# Patient Record
Sex: Female | Born: 1985 | Race: Black or African American | Hispanic: No | Marital: Married | Smoking: Never smoker
Health system: Southern US, Community
[De-identification: ages and names within clinical notes are randomized; demographics above are authoritative.]

## PROBLEM LIST (undated history)

## (undated) DIAGNOSIS — K219 Gastro-esophageal reflux disease without esophagitis: Secondary | ICD-10-CM

## (undated) DIAGNOSIS — F32A Depression, unspecified: Secondary | ICD-10-CM

## (undated) DIAGNOSIS — Z22322 Carrier or suspected carrier of Methicillin resistant Staphylococcus aureus: Secondary | ICD-10-CM

## (undated) DIAGNOSIS — F640 Transsexualism: Secondary | ICD-10-CM

## (undated) DIAGNOSIS — Z789 Other specified health status: Secondary | ICD-10-CM

## (undated) DIAGNOSIS — F329 Major depressive disorder, single episode, unspecified: Secondary | ICD-10-CM

## (undated) HISTORY — PX: FOOT SURGERY: SHX648

## (undated) HISTORY — DX: Depression, unspecified: F32.A

## (undated) HISTORY — DX: Major depressive disorder, single episode, unspecified: F32.9

## (undated) HISTORY — DX: Gastro-esophageal reflux disease without esophagitis: K21.9

---
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
## 2003-11-11 ENCOUNTER — Emergency Department (HOSPITAL_COMMUNITY): Admission: EM | Admit: 2003-11-11 | Discharge: 2003-11-11 | Payer: Self-pay | Admitting: Family Medicine

## 2004-04-16 ENCOUNTER — Emergency Department (HOSPITAL_COMMUNITY): Admission: EM | Admit: 2004-04-16 | Discharge: 2004-04-16 | Payer: Self-pay | Admitting: Family Medicine

## 2004-10-28 ENCOUNTER — Emergency Department (HOSPITAL_COMMUNITY): Admission: EM | Admit: 2004-10-28 | Discharge: 2004-10-28 | Payer: Self-pay | Admitting: Emergency Medicine

## 2007-07-07 ENCOUNTER — Emergency Department (HOSPITAL_COMMUNITY): Admission: EM | Admit: 2007-07-07 | Discharge: 2007-07-07 | Payer: Self-pay | Admitting: Family Medicine

## 2008-01-29 ENCOUNTER — Emergency Department (HOSPITAL_COMMUNITY): Admission: EM | Admit: 2008-01-29 | Discharge: 2008-01-29 | Payer: Self-pay | Admitting: Emergency Medicine

## 2008-09-27 ENCOUNTER — Emergency Department (HOSPITAL_COMMUNITY): Admission: EM | Admit: 2008-09-27 | Discharge: 2008-09-27 | Payer: Self-pay | Admitting: Emergency Medicine

## 2008-10-31 ENCOUNTER — Emergency Department (HOSPITAL_COMMUNITY): Admission: EM | Admit: 2008-10-31 | Discharge: 2008-10-31 | Payer: Self-pay | Admitting: Emergency Medicine

## 2009-05-10 ENCOUNTER — Emergency Department (HOSPITAL_COMMUNITY): Admission: EM | Admit: 2009-05-10 | Discharge: 2009-05-10 | Payer: Self-pay | Admitting: Family Medicine

## 2009-12-08 ENCOUNTER — Emergency Department (HOSPITAL_COMMUNITY): Admission: EM | Admit: 2009-12-08 | Discharge: 2009-12-08 | Payer: Self-pay | Admitting: Family Medicine

## 2010-01-03 ENCOUNTER — Emergency Department (HOSPITAL_COMMUNITY): Admission: EM | Admit: 2010-01-03 | Discharge: 2010-01-03 | Payer: Self-pay | Admitting: Family Medicine

## 2010-02-05 ENCOUNTER — Emergency Department (HOSPITAL_COMMUNITY): Admission: EM | Admit: 2010-02-05 | Discharge: 2010-02-05 | Payer: Self-pay | Admitting: Family Medicine

## 2010-06-02 LAB — POCT URINALYSIS DIP (DEVICE)
Glucose, UA: NEGATIVE mg/dL
Hgb urine dipstick: NEGATIVE
Protein, ur: NEGATIVE mg/dL
Specific Gravity, Urine: 1.02 (ref 1.005–1.030)
Urobilinogen, UA: 0.2 mg/dL (ref 0.0–1.0)

## 2010-06-15 LAB — CBC
HCT: 33.9 % — ABNORMAL LOW (ref 36.0–46.0)
Hemoglobin: 11.4 g/dL — ABNORMAL LOW (ref 12.0–15.0)
MCV: 86.1 fL (ref 78.0–100.0)
Platelets: 230 10*3/uL (ref 150–400)
WBC: 3.5 10*3/uL — ABNORMAL LOW (ref 4.0–10.5)

## 2010-06-15 LAB — DIFFERENTIAL
Eosinophils Absolute: 0 10*3/uL (ref 0.0–0.7)
Eosinophils Relative: 1 % (ref 0–5)
Lymphs Abs: 1.3 10*3/uL (ref 0.7–4.0)
Monocytes Absolute: 0.3 10*3/uL (ref 0.1–1.0)
Monocytes Relative: 10 % (ref 3–12)

## 2010-06-15 LAB — POCT URINALYSIS DIP (DEVICE)
Glucose, UA: NEGATIVE mg/dL
Hgb urine dipstick: NEGATIVE
Specific Gravity, Urine: 1.02 (ref 1.005–1.030)
Urobilinogen, UA: 4 mg/dL — ABNORMAL HIGH (ref 0.0–1.0)

## 2010-06-15 LAB — HEPATIC FUNCTION PANEL
ALT: 20 U/L (ref 0–35)
Alkaline Phosphatase: 53 U/L (ref 39–117)
Bilirubin, Direct: <0.1 mg/dL (ref 0.0–0.3)
Total Bilirubin: 0.5 mg/dL (ref 0.3–1.2)

## 2010-06-15 LAB — POCT I-STAT, CHEM 8
Calcium, Ion: 1.25 mmol/L (ref 1.12–1.32)
Hemoglobin: 12.9 g/dL (ref 12.0–15.0)
Sodium: 140 mEq/L (ref 135–145)
TCO2: 26 mmol/L (ref 0–100)

## 2010-06-15 LAB — HIV ANTIBODY (ROUTINE TESTING W REFLEX): HIV: NONREACTIVE

## 2010-06-24 ENCOUNTER — Emergency Department (HOSPITAL_COMMUNITY)
Admission: EM | Admit: 2010-06-24 | Discharge: 2010-06-25 | Disposition: A | Discharge status: Home / Self Care | Payer: 59 | Attending: Emergency Medicine | Admitting: Emergency Medicine

## 2010-06-24 ENCOUNTER — Emergency Department (HOSPITAL_COMMUNITY): Payer: 59

## 2010-06-24 DIAGNOSIS — K2970 Gastritis, unspecified, without bleeding: Secondary | ICD-10-CM | POA: Insufficient documentation

## 2010-06-24 DIAGNOSIS — R10816 Epigastric abdominal tenderness: Secondary | ICD-10-CM | POA: Insufficient documentation

## 2010-06-24 DIAGNOSIS — M549 Dorsalgia, unspecified: Secondary | ICD-10-CM | POA: Insufficient documentation

## 2010-06-24 DIAGNOSIS — R112 Nausea with vomiting, unspecified: Secondary | ICD-10-CM | POA: Insufficient documentation

## 2010-06-24 DIAGNOSIS — R1013 Epigastric pain: Secondary | ICD-10-CM | POA: Insufficient documentation

## 2010-06-24 LAB — DIFFERENTIAL
Basophils Absolute: 0 10*3/uL (ref 0.0–0.1)
Basophils Relative: 1 % (ref 0–1)
Eosinophils Relative: 1 % (ref 0–5)
Lymphocytes Relative: 33 % (ref 12–46)
Monocytes Absolute: 0.5 10*3/uL (ref 0.1–1.0)
Monocytes Relative: 11 % (ref 3–12)
Neutro Abs: 2.3 10*3/uL (ref 1.7–7.7)

## 2010-06-24 LAB — CBC
HCT: 36.8 % (ref 36.0–46.0)
Hemoglobin: 11.9 g/dL — ABNORMAL LOW (ref 12.0–15.0)
MCH: 27.5 pg (ref 26.0–34.0)
MCHC: 32.3 g/dL (ref 30.0–36.0)
RDW: 12.6 % (ref 11.5–15.5)

## 2010-06-24 LAB — URINALYSIS, ROUTINE W REFLEX MICROSCOPIC
Bilirubin Urine: NEGATIVE
Hgb urine dipstick: NEGATIVE
Ketones, ur: NEGATIVE mg/dL
Nitrite: NEGATIVE
Urobilinogen, UA: 0.2 mg/dL (ref 0.0–1.0)

## 2010-06-24 LAB — BASIC METABOLIC PANEL
CO2: 28 mEq/L (ref 19–32)
Calcium: 9.5 mg/dL (ref 8.4–10.5)
Creatinine, Ser: 0.73 mg/dL (ref 0.4–1.2)
GFR calc non Af Amer: >60 mL/min (ref >60)
Glucose, Bld: 95 mg/dL (ref 70–99)
Sodium: 139 mEq/L (ref 135–145)

## 2010-06-24 LAB — POCT PREGNANCY, URINE: Preg Test, Ur: NEGATIVE

## 2010-06-25 LAB — LIPASE, BLOOD: Lipase: 28 U/L (ref 11–59)

## 2010-06-25 LAB — POCT I-STAT, CHEM 8
HCT: 40 % (ref 36.0–46.0)
Hemoglobin: 13.6 g/dL (ref 12.0–15.0)
Potassium: 4.4 mEq/L (ref 3.5–5.1)
Sodium: 139 mEq/L (ref 135–145)
TCO2: 27 mmol/L (ref 0–100)

## 2010-06-25 LAB — CBC
MCH: 26.4 pg (ref 26.0–34.0)
MCHC: 30.9 g/dL (ref 30.0–36.0)
Platelets: 229 10*3/uL (ref 150–400)
RDW: 12.7 % (ref 11.5–15.5)

## 2010-06-25 LAB — HEPATIC FUNCTION PANEL
AST: 18 U/L (ref 0–37)
Albumin: 3.6 g/dL (ref 3.5–5.2)
Total Bilirubin: 0.3 mg/dL (ref 0.3–1.2)
Total Protein: 7.2 g/dL (ref 6.0–8.3)

## 2010-07-04 ENCOUNTER — Inpatient Hospital Stay (INDEPENDENT_AMBULATORY_CARE_PROVIDER_SITE_OTHER)
Admission: RE | Admit: 2010-07-04 | Discharge: 2010-07-04 | Disposition: A | Discharge status: Home / Self Care | Payer: 59 | Source: Ambulatory Visit | Attending: Emergency Medicine | Admitting: Emergency Medicine

## 2010-07-04 DIAGNOSIS — M545 Low back pain: Secondary | ICD-10-CM

## 2010-12-03 LAB — POCT RAPID STREP A: Streptococcus, Group A Screen (Direct): NEGATIVE

## 2011-09-17 ENCOUNTER — Encounter (HOSPITAL_COMMUNITY): Payer: Self-pay | Admitting: Emergency Medicine

## 2011-09-17 ENCOUNTER — Emergency Department (INDEPENDENT_AMBULATORY_CARE_PROVIDER_SITE_OTHER)
Admission: EM | Admit: 2011-09-17 | Discharge: 2011-09-17 | Disposition: A | Discharge status: Home / Self Care | Payer: Self-pay | Source: Home / Self Care | Attending: Emergency Medicine | Admitting: Emergency Medicine

## 2011-09-17 DIAGNOSIS — R590 Localized enlarged lymph nodes: Secondary | ICD-10-CM

## 2011-09-17 DIAGNOSIS — R599 Enlarged lymph nodes, unspecified: Secondary | ICD-10-CM

## 2011-09-17 MED ORDER — IBUPROFEN 800 MG PO TABS
ORAL_TABLET | ORAL | Status: AC
  Filled 2011-09-17: qty 1

## 2011-09-17 MED ORDER — HYDROCODONE-ACETAMINOPHEN 5-325 MG PO TABS
ORAL_TABLET | ORAL | Status: AC
  Filled 2011-09-17: qty 1

## 2011-09-17 MED ORDER — IBUPROFEN 800 MG PO TABS
800.0000 mg | ORAL_TABLET | Freq: Once | ORAL | Status: AC
  Administered 2011-09-17: 800 mg via ORAL

## 2011-09-17 MED ORDER — HYDROCODONE-ACETAMINOPHEN 5-325 MG PO TABS: 1.0000 | ORAL_TABLET | Freq: Once | ORAL | Status: DC

## 2011-09-17 MED ORDER — DOXYCYCLINE HYCLATE 100 MG PO CAPS: 100.0000 mg | ORAL_CAPSULE | Freq: Two times a day (BID) | ORAL | Status: DC

## 2011-09-17 MED ORDER — HYDROCODONE-ACETAMINOPHEN 5-500 MG PO TABS: 1.0000 | ORAL_TABLET | Freq: Four times a day (QID) | ORAL | Status: DC | PRN

## 2011-09-17 NOTE — ED Notes (Signed)
Notified dr Ladon Applebaum that patient is driving and reports there is no one else to drive.  Changed order to ibuprofen 800mg  po x 1 .

## 2011-09-17 NOTE — ED Provider Notes (Signed)
History     CSN: [id number]  Arrival date & time 09/17/11  1109   First MD Initiated Contact with Patient 09/17/11 1113      Chief Complaint  Patient presents with  . Neck Pain    (Consider location/radiation/quality/duration/timing/severity/associated sxs/prior treatment) HPI Comments: Shouldn't presents urgent care today describing increase pain and tenderness in the back of her neck where she feels a "is a big bump", she describes that after having her hair braided she started experiencing pain in this area and suddenly develop a sore which she started treating with topical antibiotic and alcohol. The sores seem to have improved and almost disappeared but then the swelling set in and is hurting her and has given her a headache and her right ear is hurting. Sunday and did feel a bit nauseous. Denies any constitutional symptoms such as fevers, arthralgias myalgias or changes in appetite.  Patient is a 26 y.o. female presenting with neck pain. The history is provided by the patient.  Neck Pain  This is a new problem. The current episode started more than 2 days ago. The problem occurs constantly. The problem has been gradually worsening. The pain is at a severity of 8/10. Associated symptoms include weakness. Pertinent negatives include no photophobia, no numbness and no tingling.    History reviewed. No pertinent past medical history.  History reviewed. No pertinent past surgical history.  No family history on file.  History  Substance Use Topics  . Smoking status: Never Smoker   . Smokeless tobacco: Not on file  . Alcohol Use: No    OB History    Grav Para Term Preterm Abortions TAB SAB Ect Mult Living                  Review of Systems  Constitutional: Positive for activity change. Negative for fever, chills, diaphoresis, appetite change and fatigue.  HENT: Positive for neck pain. Negative for congestion and neck stiffness.   Eyes: Negative for photophobia.  Skin:  Negative for rash and wound.  Neurological: Positive for weakness. Negative for tingling and numbness.    Allergies  Review of patient's allergies indicates no known allergies.  Home Medications   Current Outpatient Rx  Name Route Sig Dispense Refill  . ALEVE PO Oral Take by mouth.      BP 129/76  Pulse 72  Temp 98.5 F (36.9 C) (Oral)  Resp 16  SpO2 100%  LMP 07/09/2011  Physical Exam  Nursing note and vitals reviewed. Constitutional:  Non-toxic appearance. She does not have a sickly appearance. She does not appear ill. She appears distressed.  HENT:  Head: Normocephalic.  Eyes: Conjunctivae are normal. Right eye exhibits no discharge. Left eye exhibits no discharge.  Neck: Trachea normal and normal range of motion. Neck supple. No JVD present.    Cardiovascular: Regular rhythm.   Pulmonary/Chest: Effort normal and breath sounds normal.  Musculoskeletal: Normal range of motion. She exhibits tenderness.  Lymphadenopathy:    She has no cervical adenopathy.  Neurological: She is alert.  Skin: No rash noted.    ED Course  Procedures (including critical care time)  Labs Reviewed - No data to display No results found.   No diagnosis found.    MDM  Possibly reactive lymphadenopathy a suboccipital occipital. Will start patient on doxycycline and provide with Lortab for treatment of pain for 48 hours. Patient was instructed to return in 48 hours for recheck        Jimmie Molly, MD  09/17/11 1212 

## 2011-09-17 NOTE — ED Notes (Signed)
Reports Sunday was when pain started .  Reports pain in back of head, then neck , and now includes ears hurting.  Reports nausea, denies fever.  Denies vomiting.  Reports sore on back of scalp last week that has resolved per patient.

## 2011-09-19 ENCOUNTER — Emergency Department (INDEPENDENT_AMBULATORY_CARE_PROVIDER_SITE_OTHER)
Admission: EM | Admit: 2011-09-19 | Discharge: 2011-09-19 | Disposition: A | Discharge status: Home / Self Care | Payer: Self-pay | Source: Home / Self Care | Attending: Emergency Medicine | Admitting: Emergency Medicine

## 2011-09-19 ENCOUNTER — Encounter (HOSPITAL_COMMUNITY): Payer: Self-pay | Admitting: *Deleted

## 2011-09-19 DIAGNOSIS — L03818 Cellulitis of other sites: Secondary | ICD-10-CM

## 2011-09-19 DIAGNOSIS — IMO0002 Reserved for concepts with insufficient information to code with codable children: Secondary | ICD-10-CM

## 2011-09-19 DIAGNOSIS — L02818 Cutaneous abscess of other sites: Secondary | ICD-10-CM

## 2011-09-19 NOTE — ED Provider Notes (Signed)
History     CSN: [id number]  Arrival date & time 09/19/11  1131   First MD Initiated Contact with Patient 09/19/11 1216      Chief Complaint  Patient presents with  . Wound Check    (Consider location/radiation/quality/duration/timing/severity/associated sxs/prior treatment) HPI Comments: Patient returns today as instructed for followup of a posterior neck suspected soft tissue infection potentially early abscess formation. Patient proceeded to take antibiotics as prescribed along with Motrin for discomfort and pain and was applying heat compresses frequently. She reports that the area feels about the same the swelling is still there and tender and has not changed in size or have not observed any drainage. Patient denies any fevers, malaise changes in appetite or body aches or headaches." Feels the  infection is still there"  Patient is a 26 y.o. female presenting with wound check. The history is provided by the patient.  Wound Check  She was treated in the ED 5 to 10 days ago. Treatments since wound repair include oral antibiotics. There has been no drainage from the wound. The redness has not changed. The swelling has not changed. The pain has not changed.    History reviewed. No pertinent past medical history.  History reviewed. No pertinent past surgical history.  No family history on file.  History  Substance Use Topics  . Smoking status: Never Smoker   . Smokeless tobacco: Not on file  . Alcohol Use: No    OB History    Grav Para Term Preterm Abortions TAB SAB Ect Mult Living                  Review of Systems  Constitutional: Negative for activity change and appetite change.  Skin: Positive for wound. Negative for rash.  Neurological: Negative for light-headedness.    Allergies  Review of patient's allergies indicates no known allergies.  Home Medications   Current Outpatient Rx  Name Route Sig Dispense Refill  . DOXYCYCLINE HYCLATE 100 MG PO CAPS Oral  Take 1 capsule (100 mg total) by mouth 2 (two) times daily. 20 capsule 0  . HYDROCODONE-ACETAMINOPHEN 5-500 MG PO TABS Oral Take 1 tablet by mouth every 6 (six) hours as needed for pain. 30 tablet 0  . ALEVE PO Oral Take by mouth.      BP 142/75  Pulse 58  Temp 98.4 F (36.9 C) (Oral)  Resp 12  SpO2 100%  LMP 07/09/2011  Physical Exam  Nursing note and vitals reviewed. Constitutional: She appears well-developed and well-nourished.  Neck: Neck supple.  Lymphadenopathy:    She has no cervical adenopathy.  Skin: No rash noted. There is erythema.       ED Course  INCISION AND DRAINAGE Date/Time: 09/19/2011 1:12 PM Performed by: Caroll Weinheimer Authorized by: Jimmie Molly Consent: Verbal consent obtained. Consent given by: patient Patient understanding: patient states understanding of the procedure being performed Patient identity confirmed: verbally with patient Type: abscess Body area: head/neck Location details: scalp Anesthesia: local infiltration Local anesthetic: lidocaine 1% with epinephrine Anesthetic total: 8 ml Patient sedated: no Scalpel size: 11 Incision type: single straight Complexity: complex Drainage: purulent and serosanguinous Packing material: 1/4 in gauze and 1/4 in iodoform gauze Patient tolerance: Patient tolerated the procedure well with no immediate complications. Comments: Purulent exudate sample obtained for cultures   (including critical care time)  Labs Reviewed - No data to display No results found.   No diagnosis found.    MDM  Term visit 48 hours later  area felt fluctuant I&D performed with mild to moderate amount of pus obtained subsequent cavity was packed with iodoform packing. Patient instructed to return in 48 hours for packing removal in 1 week check. To continue with previously prescribed antibiotics.        Jimmie Molly, MD 09/19/11 1314

## 2011-09-19 NOTE — ED Notes (Signed)
Pt   Seen  Recently  For  abcess   Here  Today  For    A  Recheck       Pt  Reports  She  Took  Her  Anti  Biotics   But  Still  Has  Symptoms

## 2011-09-19 NOTE — ED Notes (Signed)
Pt  Reports  She  Has  irreg  Periods   And  She  Is  abstinant

## 2011-09-22 ENCOUNTER — Encounter (HOSPITAL_COMMUNITY): Payer: Self-pay | Admitting: *Deleted

## 2011-09-22 ENCOUNTER — Emergency Department (HOSPITAL_COMMUNITY)
Admission: EM | Admit: 2011-09-22 | Discharge: 2011-09-22 | Disposition: A | Discharge status: Home / Self Care | Payer: Self-pay | Source: Home / Self Care

## 2011-09-22 ENCOUNTER — Emergency Department (HOSPITAL_COMMUNITY)
Admission: EM | Admit: 2011-09-22 | Discharge: 2011-09-22 | Disposition: A | Discharge status: Home Health | Payer: Self-pay | Attending: Emergency Medicine | Admitting: Emergency Medicine

## 2011-09-22 ENCOUNTER — Telehealth (HOSPITAL_COMMUNITY): Payer: Self-pay | Admitting: *Deleted

## 2011-09-22 DIAGNOSIS — L0291 Cutaneous abscess, unspecified: Secondary | ICD-10-CM

## 2011-09-22 DIAGNOSIS — Z4801 Encounter for change or removal of surgical wound dressing: Secondary | ICD-10-CM | POA: Insufficient documentation

## 2011-09-22 LAB — CULTURE, ROUTINE-ABSCESS

## 2011-09-22 MED ORDER — IBUPROFEN 400 MG PO TABS
600.0000 mg | ORAL_TABLET | Freq: Once | ORAL | Status: AC
  Administered 2011-09-22: 600 mg via ORAL
  Filled 2011-09-22: qty 1

## 2011-09-22 MED ORDER — HYDROCODONE-ACETAMINOPHEN 5-325 MG PO TABS
1.0000 | ORAL_TABLET | Freq: Once | ORAL | Status: AC
  Administered 2011-09-22: 1 via ORAL
  Filled 2011-09-22: qty 1

## 2011-09-22 NOTE — ED Provider Notes (Signed)
History    This chart was scribed for Madeline Roots, MD, MD by Smitty Pluck. The patient was seen in room TR08C and the patient's care was started at 11:44PM.   CSN: [id number]  Arrival date & time 09/22/11  1123   First MD Initiated Contact with Patient 09/22/11 1137      Chief Complaint  Patient presents with  . Wound Check    (Consider location/radiation/quality/duration/timing/severity/associated sxs/prior treatment) The history is provided by the patient.   Madeline Larsen is a 26 y.o. female who presents to the Emergency Department due to wound check on posterior neck. The wound was drained 3 days ago. Reports moderate pain in area of wound. Pt had wound for 1 week before being seen in ED for packing. Pain has been constant since onset. Denies radiation. Pt reports having used abx without relief. Constant dull pain worse w palp. No fever or chills. No spreading redness. No headache.    History reviewed. No pertinent past medical history.  History reviewed. No pertinent past surgical history.  No family history on file.  History  Substance Use Topics  . Smoking status: Never Smoker   . Smokeless tobacco: Not on file  . Alcohol Use: No    OB History    Grav Para Term Preterm Abortions TAB SAB Ect Mult Living                  Review of Systems  Constitutional: Negative for fever and chills.  Respiratory: Negative for shortness of breath.   Gastrointestinal: Negative for nausea and vomiting.  Neurological: Negative for weakness.    Allergies  Review of patient's allergies indicates no known allergies.  Home Medications   Current Outpatient Rx  Name Route Sig Dispense Refill  . DOXYCYCLINE HYCLATE 100 MG PO CAPS Oral Take 1 capsule (100 mg total) by mouth 2 (two) times daily. 20 capsule 0  . HYDROCODONE-ACETAMINOPHEN 5-500 MG PO TABS Oral Take 1 tablet by mouth every 6 (six) hours as needed for pain. 30 tablet 0  . ALEVE PO Oral Take by mouth.      BP  107/72  Temp 97.8 F (36.6 C) (Oral)  Resp 18  SpO2 100%  LMP 09/20/2011  Physical Exam  Nursing note and vitals reviewed. Constitutional: She is oriented to person, place, and time. She appears well-developed and well-nourished. No distress.  HENT:  Head: Normocephalic and atraumatic.  Eyes: EOM are normal.  Neck: Neck supple. No tracheal deviation present.  Cardiovascular: Normal rate.   Pulmonary/Chest: Effort normal. No respiratory distress.  Musculoskeletal: Normal range of motion.  Neurological: She is alert and oriented to person, place, and time.  Skin: Skin is warm and dry.       Small drained abscess base of hairline/posterior scalp. 1 cm open I and D incision w packing in place. Pus draining from wound. No cellulitis. No necrotic tissue.   Psychiatric: She has a normal mood and affect. Her behavior is normal.    ED Course  Procedures (including critical care time) DIAGNOSTIC STUDIES: Oxygen Saturation is 100% on room air, normal by my interpretation.    COORDINATION OF CARE:      MDM  I personally performed the services described in this documentation, which was scribed in my presence. The recorded information has been reviewed and considered. Madeline Roots, MD    Old packing removed. Moderate pus able to be expressed from wound.   Local anesthesia w 2%lido w epi. 4  cc's used. Broke up loculations. Additional pus drained. New packing placed.  No cellulitis.   Pt has ride, does not have to drive. Requests pain med. vicodin 1po. Motrin po.  Sterile dressing.         Madeline Roots, MD 09/22/11 1159

## 2011-09-22 NOTE — ED Notes (Signed)
Abscess culture: MRSA.  Pt. adequately treated with Doxycycline.  I called home/cell # and the person that answered does not know her. I called work number and she does not work there any longer.  It looked like pt. was here now for a recheck. I asked registration and pt. left without being seen because she did not want make a payment.     Pt. went to the ED. I called there and verified pt. x 2 . Pt. given results and told she was adequately treated with Doxycycline.   Pt. given MRSA instructions. Pt. states she has another sore on her foot.  I told her to talk to her nurse and doctor about it to make sure she has enough antibiotic to cover it.

## 2011-09-22 NOTE — ED Notes (Signed)
Patient with wound drained x 3 days ago, patient now with packing to be removed

## 2011-09-26 ENCOUNTER — Encounter (HOSPITAL_COMMUNITY): Payer: Self-pay

## 2011-09-26 ENCOUNTER — Emergency Department (HOSPITAL_COMMUNITY)
Admission: EM | Admit: 2011-09-26 | Discharge: 2011-09-26 | Disposition: A | Discharge status: Home / Self Care | Payer: Self-pay | Attending: Emergency Medicine | Admitting: Emergency Medicine

## 2011-09-26 DIAGNOSIS — Z8614 Personal history of Methicillin resistant Staphylococcus aureus infection: Secondary | ICD-10-CM | POA: Insufficient documentation

## 2011-09-26 DIAGNOSIS — B9789 Other viral agents as the cause of diseases classified elsewhere: Secondary | ICD-10-CM | POA: Insufficient documentation

## 2011-09-26 DIAGNOSIS — Z4800 Encounter for change or removal of nonsurgical wound dressing: Secondary | ICD-10-CM

## 2011-09-26 DIAGNOSIS — B349 Viral infection, unspecified: Secondary | ICD-10-CM

## 2011-09-26 DIAGNOSIS — M791 Myalgia, unspecified site: Secondary | ICD-10-CM

## 2011-09-26 DIAGNOSIS — IMO0001 Reserved for inherently not codable concepts without codable children: Secondary | ICD-10-CM | POA: Insufficient documentation

## 2011-09-26 DIAGNOSIS — R110 Nausea: Secondary | ICD-10-CM | POA: Insufficient documentation

## 2011-09-26 HISTORY — DX: Carrier or suspected carrier of methicillin resistant Staphylococcus aureus: Z22.322

## 2011-09-26 LAB — URINALYSIS, ROUTINE W REFLEX MICROSCOPIC
Ketones, ur: 15 mg/dL — AB
Leukocytes, UA: NEGATIVE
Nitrite: NEGATIVE
Specific Gravity, Urine: 1.025 (ref 1.005–1.030)
pH: 6 (ref 5.0–8.0)

## 2011-09-26 LAB — URINE MICROSCOPIC-ADD ON

## 2011-09-26 LAB — POCT PREGNANCY, URINE: Preg Test, Ur: NEGATIVE

## 2011-09-26 MED ORDER — PROMETHAZINE HCL 25 MG PO TABS: ORAL_TABLET | ORAL | Status: DC

## 2011-09-26 MED ORDER — ONDANSETRON 4 MG PO TBDP
8.0000 mg | ORAL_TABLET | Freq: Once | ORAL | Status: AC
  Administered 2011-09-26: 8 mg via ORAL
  Filled 2011-09-26: qty 2

## 2011-09-26 NOTE — ED Provider Notes (Signed)
History  This chart was scribed for Ward Givens, MD by Erskine Emery. This patient was seen in room TR08C/TR08C and the patient's care was started at 10:58.   CSN: [id number]  Arrival date & time 09/26/11  0865   First MD Initiated Contact with Patient 09/26/11 1058      Chief Complaint  Patient presents with  . Abscess  . Fever    (Consider location/radiation/quality/duration/timing/severity/associated sxs/prior treatment) HPI  Madeline Larsen is a 26 y.o. female who presents to the Emergency Department complaining of constant nausea with associated fever, body aches, chills, diaphoresis, a little diarrhea, and intermittant upper abdominal pain, described as sometimes sharp off and on  for the last 2 days. Pt denies any emesis, coughing, or sore throat. She denies dysuria, frequency or vaginal discharge.    Pt reports she was seen last week in urgent care for the abscess on the back of her neck and prescribed antibiotics. Pt reports the pain associated with the abscess is still present but improving.    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   PCP none  Past Medical History  Diagnosis Date  . MRSA (methicillin resistant staph aureus) culture positive     No past surgical history on file.  No family history on file.  History  Substance Use Topics  . Smoking status: Never Smoker   . Smokeless tobacco: Not on file  . Alcohol Use: No  Employed  OB History    Grav Para Term Preterm Abortions TAB SAB Ect Mult Living                   Review of Systems  Constitutional: Positive for fever, chills and diaphoresis.  HENT: Negative for sore throat.   Eyes: Positive for pain.  Respiratory: Negative for cough and shortness of breath.   Gastrointestinal: Positive for nausea, abdominal pain and diarrhea. Negative for vomiting.  Genitourinary: Negative for dysuria and vaginal discharge.    Skin:       Abscess on posterior neck  Neurological: Negative for weakness.    Allergies  Review of patient's allergies indicates no known allergies.  Home Medications   Current Outpatient Rx  Name Route Sig Dispense Refill  . DOXYCYCLINE HYCLATE 100 MG PO TABS Oral Take 100 mg by mouth 2 (two) times daily. Started on 7/10 for 10 days    . IBUPROFEN 800 MG PO TABS Oral Take 800 mg by mouth every 8 (eight) hours as needed. For pain    . ALEVE PO Oral Take 1 tablet by mouth daily as needed. For pain      BP 133/75  Pulse 91  Temp 98.5 F (36.9 C) (Oral)  Resp 18  SpO2 99%  LMP 09/20/2011\  Vital signs normal    Physical Exam  Nursing note and vitals reviewed. Constitutional: She is oriented to person, place, and time. She appears well-developed and well-nourished. No distress.  HENT:  Head: Normocephalic and atraumatic.  Right Ear: External ear normal.  Left Ear: External ear normal.  Mouth/Throat: Oropharynx is clear and moist.  Eyes: Conjunctivae and EOM are normal. Pupils are equal, round, and reactive to light.  Neck: Normal range of motion. Neck supple. No tracheal deviation present.  Cardiovascular: Normal rate, regular rhythm and normal heart sounds.   Pulmonary/Chest: Effort normal and breath sounds normal. No respiratory distress.  Abdominal: Soft. Bowel  sounds are normal. She exhibits no distension. There is tenderness. There is no rebound and no guarding.       Mild tenderness in suprapubic region.   Musculoskeletal: Normal range of motion.  Neurological: She is alert and oriented to person, place, and time.  Skin: Skin is warm and dry.       Area at lower scalp line of posterior neck with crusting dried blood surrounding it and a small amount of drainage, but no induration underneath. I pulled the packing out.   Psychiatric: She has a normal mood and affect. Her behavior is normal.    ED Course  Procedures (including critical care time)   Medications   ondansetron (ZOFRAN-ODT) disintegrating tablet 8 mg (8 mg Oral Given 09/26/11 1146)     DIAGNOSTIC STUDIES: Oxygen Saturation is 99% on room air, normal by my interpretation.    COORDINATION OF CARE: 11:18--I discussed treatment plan including nausea medication and urinalysis with pt and pt agreed. I informed her that most likely her symptoms are due to a GI infection and tended to her abscess.   11:30--Medication order: Ondansetron (Zofran-ODT) disintegrating tablet 8 mg--once  12:26--I rechecked the pt and her nausea is better, and notified her that she can begin oral hydrating. I told her I would check in again after her urinalysis is back.    13:51--I rechecked the pt. She has been drinking oral fluids without vomiting. She still reports chills but I let her know that her temperature is currently normal. I informed her that I would prescribe her some nausea medication and motrin or tylenol. I directed the pt not to eat so she does not vomit and let her know that we would be discharging her soon. Pt requests work note for the weekend.   Results for orders placed during the hospital encounter of 09/26/11  URINALYSIS, ROUTINE W REFLEX MICROSCOPIC      Component Value Range   Color, Urine YELLOW  YELLOW   APPearance CLOUDY (*) CLEAR   Specific Gravity, Urine 1.025  1.005 - 1.030   pH 6.0  5.0 - 8.0   Glucose, UA NEGATIVE  NEGATIVE mg/dL   Hgb urine dipstick TRACE (*) NEGATIVE   Bilirubin Urine SMALL (*) NEGATIVE   Ketones, ur 15 (*) NEGATIVE mg/dL   Protein, ur 30 (*) NEGATIVE mg/dL   Urobilinogen, UA 1.0  0.0 - 1.0 mg/dL   Nitrite NEGATIVE  NEGATIVE   Leukocytes, UA NEGATIVE  NEGATIVE  POCT PREGNANCY, URINE      Component Value Range   Preg Test, Ur NEGATIVE  NEGATIVE  URINE MICROSCOPIC-ADD ON      Component Value Range   Squamous Epithelial / LPF MANY (*) RARE   WBC, UA 0-2  <3 WBC/hpf   RBC / HPF 0-2  <3 RBC/hpf   Bacteria, UA RARE  RARE   Casts HYALINE CASTS (*) NEGATIVE    Urine-Other MUCOUS PRESENT     Laboratory interpretation all normal      1. Nausea   2. Myalgia   3. Viral illness     New Prescriptions   PROMETHAZINE (PHENERGAN) 25 MG TABLET    Unwrap and insert 1 PR PRN nausea, vomiting     Plan discharge  Devoria Albe, MD, FACEP   MDM   I personally performed the services described in this documentation, which was scribed in my presence. The recorded information has been reviewed and considered.  Devoria Albe, MD, Armando Gang  Ward Givens, MD 09/26/11 1358

## 2011-09-26 NOTE — ED Notes (Signed)
Patient is tolerating po fluids.

## 2011-09-26 NOTE — ED Notes (Signed)
Abscess follow up to back of neck and dx with MRSA, compliains of flu like symptoms, with fever and generlalized body aches.

## 2011-12-18 ENCOUNTER — Emergency Department (HOSPITAL_COMMUNITY)
Admission: EM | Admit: 2011-12-18 | Discharge: 2011-12-18 | Disposition: A | Discharge status: Home / Self Care | Payer: Self-pay | Attending: Emergency Medicine | Admitting: Emergency Medicine

## 2011-12-18 ENCOUNTER — Encounter (HOSPITAL_COMMUNITY): Payer: Self-pay | Admitting: *Deleted

## 2011-12-18 DIAGNOSIS — M62838 Other muscle spasm: Secondary | ICD-10-CM | POA: Insufficient documentation

## 2011-12-18 MED ORDER — CYCLOBENZAPRINE HCL 10 MG PO TABS
5.0000 mg | ORAL_TABLET | Freq: Once | ORAL | Status: AC
  Administered 2011-12-18: 5 mg via ORAL
  Filled 2011-12-18: qty 1

## 2011-12-18 MED ORDER — CYCLOBENZAPRINE HCL 10 MG PO TABS: 10.0000 mg | ORAL_TABLET | Freq: Two times a day (BID) | ORAL | Status: DC | PRN

## 2011-12-18 NOTE — ED Provider Notes (Signed)
History     CSN: [id number]  Arrival date & time 12/18/11  0804   First MD Initiated Contact with Patient 12/18/11 [phone]      No chief complaint on file.   (Consider location/radiation/quality/duration/timing/severity/associated sxs/prior treatment) Patient is a 26 y.o. female presenting with shoulder pain. The history is provided by the patient.  Shoulder Pain This is a new problem. The current episode started in the past 7 days. The problem occurs constantly. The problem has been unchanged. Pertinent negatives include no abdominal pain, change in bowel habit, chest pain, coughing, fatigue, fever, headaches, joint swelling, myalgias or nausea. Exacerbated by: palpation to shoulder, movement of left shoulder. She has tried nothing for the symptoms. The treatment provided no relief.    Past Medical History  Diagnosis Date  . MRSA (methicillin resistant staph aureus) culture positive     No past surgical history on file.  No family history on file.  History  Substance Use Topics  . Smoking status: Never Smoker   . Smokeless tobacco: Not on file  . Alcohol Use: No    OB History    Grav Para Term Preterm Abortions TAB SAB Ect Mult Living                  Review of Systems  Constitutional: Negative for fever and fatigue.  Respiratory: Negative for cough and shortness of breath.   Cardiovascular: Negative for chest pain.  Gastrointestinal: Negative for nausea, abdominal pain, diarrhea and change in bowel habit.  Musculoskeletal: Negative for myalgias and joint swelling.  Neurological: Negative for headaches.  All other systems reviewed and are negative.    Allergies  Review of patient's allergies indicates no known allergies.  Home Medications   Current Outpatient Rx  Name Route Sig Dispense Refill  . DOXYCYCLINE HYCLATE 100 MG PO TABS Oral Take 100 mg by mouth 2 (two) times daily. Started on 7/10 for 10 days    . IBUPROFEN 800 MG PO TABS Oral Take 800 mg by mouth  every 8 (eight) hours as needed. For pain    . ALEVE PO Oral Take 1 tablet by mouth daily as needed. For pain    . PROMETHAZINE HCL 25 MG PO TABS  Unwrap and insert 1 PR PRN nausea, vomiting 8 tablet 0    There were no vitals taken for this visit.  Physical Exam  Nursing note and vitals reviewed. Constitutional: She is oriented to person, place, and time. She appears well-developed and well-nourished. No distress.  HENT:  Head: Normocephalic and atraumatic.  Eyes: EOM are normal. Pupils are equal, round, and reactive to light.  Neck: Normal range of motion.  Cardiovascular: Normal rate and normal heart sounds.   Pulmonary/Chest: Effort normal and breath sounds normal. No respiratory distress.  Abdominal: Soft. She exhibits no distension. There is no tenderness.  Musculoskeletal: Normal range of motion. She exhibits tenderness.       Left shoulder: She exhibits tenderness, pain and spasm. She exhibits normal range of motion, no bony tenderness, no swelling, no effusion, no crepitus, no deformity, no laceration, normal pulse and normal strength.  Neurological: She is alert and oriented to person, place, and time.  Skin: Skin is warm and dry.    ED Course  Procedures (including critical care time)  Labs Reviewed - No data to display No results found.   1. Muscle spasm of left shoulder       MDM  8:11 AM Pt seen and examined. Pt with about  a week of left shoulder discomfort. Pt denies trauma or excessive lifting. Pt denies medical problems. On exam, patient with left trapezius spasm. Will treat with muscle relaxants.      Daleen Bo, MD 12/18/11 [phone]

## 2011-12-18 NOTE — ED Provider Notes (Signed)
I saw and evaluated the patient, reviewed the resident's note and I agree with the findings and plan.  Loren Racer, MD 12/18/11 1531

## 2012-07-27 ENCOUNTER — Emergency Department (HOSPITAL_COMMUNITY)
Admission: EM | Admit: 2012-07-27 | Discharge: 2012-07-27 | Disposition: A | Discharge status: Home / Self Care | Payer: Self-pay | Attending: Emergency Medicine | Admitting: Emergency Medicine

## 2012-07-27 ENCOUNTER — Encounter (HOSPITAL_COMMUNITY): Payer: Self-pay | Admitting: Emergency Medicine

## 2012-07-27 DIAGNOSIS — Z8614 Personal history of Methicillin resistant Staphylococcus aureus infection: Secondary | ICD-10-CM | POA: Insufficient documentation

## 2012-07-27 DIAGNOSIS — K5289 Other specified noninfective gastroenteritis and colitis: Secondary | ICD-10-CM | POA: Insufficient documentation

## 2012-07-27 DIAGNOSIS — R112 Nausea with vomiting, unspecified: Secondary | ICD-10-CM | POA: Insufficient documentation

## 2012-07-27 DIAGNOSIS — R197 Diarrhea, unspecified: Secondary | ICD-10-CM | POA: Insufficient documentation

## 2012-07-27 DIAGNOSIS — R109 Unspecified abdominal pain: Secondary | ICD-10-CM | POA: Insufficient documentation

## 2012-07-27 DIAGNOSIS — K529 Noninfective gastroenteritis and colitis, unspecified: Secondary | ICD-10-CM

## 2012-07-27 MED ORDER — MORPHINE SULFATE 4 MG/ML IJ SOLN
4.0000 mg | Freq: Once | INTRAMUSCULAR | Status: AC
  Administered 2012-07-27: 4 mg via INTRAVENOUS
  Filled 2012-07-27: qty 1

## 2012-07-27 MED ORDER — ONDANSETRON HCL 4 MG/2ML IJ SOLN
4.0000 mg | Freq: Once | INTRAMUSCULAR | Status: AC
  Administered 2012-07-27: 4 mg via INTRAVENOUS
  Filled 2012-07-27: qty 2

## 2012-07-27 MED ORDER — ONDANSETRON HCL 8 MG PO TABS: 8.0000 mg | ORAL_TABLET | Freq: Three times a day (TID) | ORAL | Status: DC | PRN

## 2012-07-27 MED ORDER — SODIUM CHLORIDE 0.9 % IV BOLUS (SEPSIS)
1000.0000 mL | Freq: Once | INTRAVENOUS | Status: AC
  Administered 2012-07-27: 1000 mL via INTRAVENOUS

## 2012-07-27 NOTE — ED Notes (Signed)
Pt. Stated, it started 3 days ago with chills and fever and my body hurts all over.  I've been having nausea vomiting for 3 days.

## 2012-07-27 NOTE — ED Provider Notes (Signed)
History     CSN: [id number]  Arrival date & time 07/27/12  0809   First MD Initiated Contact with Patient 07/27/12 0815      Chief Complaint  Patient presents with  . Diarrhea    (Consider location/radiation/quality/duration/timing/severity/associated sxs/prior treatment) Patient is a 27 y.o. female presenting with diarrhea. The history is provided by the patient.  Diarrhea Associated symptoms: vomiting   Associated symptoms: no chills, no fever and no headaches   pt c/o nvd onset yesterday. Several episodes of both emesis and diarrhea. Watery, clear. No bloody or bilious emesis. No bloody stools. Intermittent mid abd crampy discomfort, no constant or focal abd pain. No known ill contacts or bad food ingestion. No recent new meds, abx tx, or change in meds. Denies fever or chills. Denies prior abd surgery. No distension. Normal period 2 weeks ago, no vaginal discharge or bleeding. No dysuria or gu c/o. No back or flank pain.     Past Medical History  Diagnosis Date  . MRSA (methicillin resistant staph aureus) culture positive     History reviewed. No pertinent past surgical history.  No family history on file.  History  Substance Use Topics  . Smoking status: Never Smoker   . Smokeless tobacco: Not on file  . Alcohol Use: No    OB History   Grav Para Term Preterm Abortions TAB SAB Ect Mult Living                  Review of Systems  Constitutional: Negative for fever and chills.  HENT: Negative for sore throat and neck pain.   Eyes: Negative for redness.  Respiratory: Negative for cough and shortness of breath.   Cardiovascular: Negative for chest pain.  Gastrointestinal: Positive for vomiting and diarrhea.  Genitourinary: Negative for dysuria and flank pain.  Musculoskeletal: Negative for back pain.  Skin: Negative for rash.  Neurological: Negative for headaches.  Hematological: Does not bruise/bleed easily.  Psychiatric/Behavioral: Negative for confusion.     Allergies  Review of patient's allergies indicates no known allergies.  Home Medications   Current Outpatient Rx  Name  Route  Sig  Dispense  Refill  . cyclobenzaprine (FLEXERIL) 10 MG tablet   Oral   Take 1 tablet (10 mg total) by mouth 2 (two) times daily as needed for muscle spasms.   10 tablet   0   . ibuprofen (ADVIL,MOTRIN) 200 MG tablet   Oral   Take 200 mg by mouth every 6 (six) hours as needed. For pain           BP 132/82  Pulse 81  Temp(Src) 99.2 F (37.3 C) (Oral)  Resp 20  SpO2 96%  LMP 07/06/2012  Physical Exam  Nursing note and vitals reviewed. Constitutional: She appears well-developed and well-nourished. No distress.  HENT:  Mouth/Throat: Oropharynx is clear and moist.  Eyes: Conjunctivae are normal. No scleral icterus.  Neck: Neck supple. No tracheal deviation present.  Cardiovascular: Normal rate, regular rhythm, normal heart sounds and intact distal pulses.   Pulmonary/Chest: Effort normal and breath sounds normal. No respiratory distress.  Abdominal: Soft. Normal appearance and bowel sounds are normal. She exhibits no distension and no mass. There is no tenderness. There is no rebound and no guarding.  Genitourinary:  No cva tenderness  Musculoskeletal: She exhibits no edema.  Neurological: She is alert.  Skin: Skin is warm and dry. No rash noted.  Psychiatric: She has a normal mood and affect.    ED Course  Procedures (including critical care time)     MDM  Iv ns bolus. zofran iv.  Reviewed nursing notes and prior charts for additional history.   Pt requesting food/drink.  Crackers, po fluids, tolerates.    No recurrent vomiting or diarrhea in ed.  abd soft nt.  Pt appears stable for d/c.         Suzi Roots, MD 07/27/12 1016

## 2012-07-27 NOTE — ED Notes (Signed)
Report toMarisela, RN

## 2012-07-27 NOTE — ED Notes (Signed)
Patient is alert and orientedx4.  Patient was explained discharge instructions and they understood them with no questions.  The patient's girlfriend, Wyonia Hough is taking her home.

## 2012-07-27 NOTE — ED Notes (Signed)
Patient requested something for pain all over.

## 2012-07-27 NOTE — ED Notes (Signed)
Family at bedside. 

## 2012-07-27 NOTE — ED Notes (Signed)
Introduced myself to patient.  She is resting comfortably.

## 2012-10-06 ENCOUNTER — Emergency Department (HOSPITAL_COMMUNITY)
Admission: EM | Admit: 2012-10-06 | Discharge: 2012-10-06 | Disposition: A | Discharge status: Home / Self Care | Payer: Self-pay | Attending: Emergency Medicine | Admitting: Emergency Medicine

## 2012-10-06 ENCOUNTER — Encounter (HOSPITAL_COMMUNITY): Payer: Self-pay | Admitting: Emergency Medicine

## 2012-10-06 DIAGNOSIS — R5381 Other malaise: Secondary | ICD-10-CM | POA: Insufficient documentation

## 2012-10-06 DIAGNOSIS — R531 Weakness: Secondary | ICD-10-CM

## 2012-10-06 DIAGNOSIS — R5383 Other fatigue: Secondary | ICD-10-CM | POA: Insufficient documentation

## 2012-10-06 DIAGNOSIS — Z22322 Carrier or suspected carrier of Methicillin resistant Staphylococcus aureus: Secondary | ICD-10-CM | POA: Insufficient documentation

## 2012-10-06 DIAGNOSIS — R21 Rash and other nonspecific skin eruption: Secondary | ICD-10-CM | POA: Insufficient documentation

## 2012-10-06 DIAGNOSIS — R51 Headache: Secondary | ICD-10-CM | POA: Insufficient documentation

## 2012-10-06 DIAGNOSIS — R55 Syncope and collapse: Secondary | ICD-10-CM | POA: Insufficient documentation

## 2012-10-06 DIAGNOSIS — Z3202 Encounter for pregnancy test, result negative: Secondary | ICD-10-CM | POA: Insufficient documentation

## 2012-10-06 LAB — CBC WITH DIFFERENTIAL/PLATELET
Eosinophils Absolute: 0 10*3/uL (ref 0.0–0.7)
Eosinophils Relative: 1 % (ref 0–5)
HCT: 36.4 % (ref 36.0–46.0)
Lymphocytes Relative: 39 % (ref 12–46)
Lymphs Abs: 1.1 10*3/uL (ref 0.7–4.0)
MCH: 27.3 pg (ref 26.0–34.0)
MCV: 84.3 fL (ref 78.0–100.0)
Monocytes Absolute: 0.2 10*3/uL (ref 0.1–1.0)
Monocytes Relative: 6 % (ref 3–12)
Platelets: 286 10*3/uL (ref 150–400)
RBC: 4.32 MIL/uL (ref 3.87–5.11)
WBC: 2.9 10*3/uL — ABNORMAL LOW (ref 4.0–10.5)

## 2012-10-06 LAB — POCT I-STAT, CHEM 8
Chloride: 106 mEq/L (ref 96–112)
Glucose, Bld: 85 mg/dL (ref 70–99)
HCT: 38 % (ref 36.0–46.0)
Potassium: 3.9 mEq/L (ref 3.5–5.1)

## 2012-10-06 LAB — URINALYSIS, ROUTINE W REFLEX MICROSCOPIC
Bilirubin Urine: NEGATIVE
Glucose, UA: NEGATIVE mg/dL
Hgb urine dipstick: NEGATIVE
Ketones, ur: NEGATIVE mg/dL
Leukocytes, UA: NEGATIVE
Protein, ur: NEGATIVE mg/dL
pH: 6 (ref 5.0–8.0)

## 2012-10-06 LAB — BASIC METABOLIC PANEL
CO2: 26 mEq/L (ref 19–32)
Chloride: 102 mEq/L (ref 96–112)
Creatinine, Ser: 0.72 mg/dL (ref 0.50–1.10)
Glucose, Bld: 85 mg/dL (ref 70–99)

## 2012-10-06 LAB — GLUCOSE, CAPILLARY: Glucose-Capillary: 87 mg/dL (ref 70–99)

## 2012-10-06 MED ORDER — SODIUM CHLORIDE 0.9 % IV BOLUS (SEPSIS)
1000.0000 mL | Freq: Once | INTRAVENOUS | Status: AC
  Administered 2012-10-06: 1000 mL via INTRAVENOUS

## 2012-10-06 MED ORDER — IBUPROFEN 800 MG PO TABS: 800.0000 mg | ORAL_TABLET | Freq: Three times a day (TID) | ORAL | Status: DC

## 2012-10-06 MED ORDER — IBUPROFEN 800 MG PO TABS
800.0000 mg | ORAL_TABLET | Freq: Once | ORAL | Status: AC
  Administered 2012-10-06: 800 mg via ORAL
  Filled 2012-10-06: qty 1

## 2012-10-06 MED ORDER — DOXYCYCLINE HYCLATE 100 MG PO CAPS: 100.0000 mg | ORAL_CAPSULE | Freq: Two times a day (BID) | ORAL | Status: DC

## 2012-10-06 MED ORDER — ONDANSETRON HCL 4 MG PO TABS: 4.0000 mg | ORAL_TABLET | Freq: Four times a day (QID) | ORAL | Status: DC

## 2012-10-06 MED ORDER — ONDANSETRON HCL 4 MG/2ML IJ SOLN
4.0000 mg | Freq: Once | INTRAMUSCULAR | Status: AC
  Administered 2012-10-06: 4 mg via INTRAVENOUS
  Filled 2012-10-06: qty 2

## 2012-10-06 MED ORDER — DEXTROSE 5 % AND 0.45 % NACL IV BOLUS: 1000.0000 mL | Freq: Once | INTRAVENOUS | Status: DC

## 2012-10-06 NOTE — ED Notes (Addendum)
Pt reports she woke up fine but started feeling faint and nauseas as the day progresses. Reports this has happened before almost once a month. Pt reports also bumps/ warts on her, 1 on arm and 1 on leg. Pt reports someone told her she might need to be checked for diabetes.  Also headache 6/10.

## 2012-10-06 NOTE — ED Provider Notes (Signed)
CSN: [id number]     Arrival date & time 10/06/12  1229 History     First MD Initiated Contact with Patient 10/06/12 1333     Chief Complaint  Patient presents with  . feels faint and nauseas    . Headache   (Consider location/radiation/quality/duration/timing/severity/associated sxs/prior Treatment) HPI  27 year old female presents complaining of generalized weakness. The patient states for the past 2 months she has had intermittent bouts of feeling weak, tired, lethargic, and nearly passing out. She also endorsed nausea with occasional nonbloody nonbilious vomitus, last vomit was yesterday. She works at [employer] and denies any strenuous activities however she feels very tired. She also notice an itchy burning rash which appears in random spots in the body ongoing for the past 2 months as well. Rash sometimes drain out pustular discharge. Once the rash resolved but usually leaves a dark patch on her skin. Today patient also endorsed having a headache. Described as a throbbing sensation to L4 head, nonradiating with no neck stiffness. Patient denies fever, chills or chest pain, shortness of breath, throat swelling, abdominal pain, back pain, dysuria, hematuria, hematochezia or melena, numbness or tingling sensation. Patient's last menstrual period was last week. She is a G0P0. Patient is a nonsmoker, no history of diabetes.  No recent tick bite.    Past Medical History  Diagnosis Date  . MRSA (methicillin resistant staph aureus) culture positive    History reviewed. No pertinent past surgical history. No family history on file. History  Substance Use Topics  . Smoking status: Never Smoker   . Smokeless tobacco: Not on file  . Alcohol Use: No   OB History   Grav Para Term Preterm Abortions TAB SAB Ect Mult Living                 Review of Systems  All other systems reviewed and are negative.    Allergies  Review of patient's allergies indicates no known allergies.  Home Medications    Current Outpatient Rx  Name  Route  Sig  Dispense  Refill  . ibuprofen (ADVIL,MOTRIN) 200 MG tablet   Oral   Take 200 mg by mouth every 6 (six) hours as needed for pain.          BP 131/74  Pulse 60  Temp(Src) 98.7 F (37.1 C) (Oral)  Resp 16  SpO2 98% Physical Exam  Nursing note and vitals reviewed. Constitutional: She is oriented to person, place, and time. She appears well-developed and well-nourished. No distress.  Awake, alert, nontoxic appearance  HENT:  Head: Atraumatic.  Eyes: Conjunctivae are normal. Right eye exhibits no discharge. Left eye exhibits no discharge.  Neck: Neck supple.  No nuchal rigidity  Cardiovascular: Normal rate and regular rhythm.   Pulmonary/Chest: Effort normal. No respiratory distress. She exhibits no tenderness.  Abdominal: Soft. There is no tenderness. There is no rebound.  Musculoskeletal: She exhibits no tenderness.  ROM appears intact, no obvious focal weakness  Neurological: She is alert and oriented to person, place, and time.  Mental status and motor strength appears intact  Skin: Rash (Patient has several vesicular pustular rash measuring <1cm in diameter noted to right forearm, left upper arm, left lower leg) noted.  Psychiatric: She has a normal mood and affect.    ED Course   Procedures (including critical care time)  Pt with chronic fatigue, having sxs lasting >2 months here for evaluation.  She is afebrile, VSS.  Has neg pregnancy test, normal UA, normal Istat8, CBC  with WBC low at 2.9 but no other finding.  Normal CBG, no focal neuro deficits, non toxic in appearance.  She endorse headache.  No red flags.  She has a vesicular/pustular rash noted to R forearm, L thigh, and L upper arm.  Unsure etiology of rash, but will treat with doxycycline.  Doubt Lyme disease, RMSF, syphilis.  Pt is not currently sexually active.  Pregnancy test neg.  Pt currently stable for discharge.  Return precaution discussed.  Pt agrees with plan.     Labs Reviewed  CBC WITH DIFFERENTIAL - Abnormal; Notable for the following:    WBC 2.9 (*)    Hemoglobin 11.8 (*)    Neutro Abs 1.6 (*)    All other components within normal limits  POCT I-STAT, CHEM 8 - Abnormal; Notable for the following:    BUN 5 (*)    All other components within normal limits  BASIC METABOLIC PANEL  GLUCOSE, CAPILLARY  URINALYSIS, ROUTINE W REFLEX MICROSCOPIC   No results found. 1. Rash   2. Generalized weakness     MDM  BP 162/77  Pulse 51  Temp(Src) 98.7 F (37.1 C) (Oral)  Resp 16  SpO2 98%   Fayrene Helper, PA-C 10/06/12 1532

## 2012-10-06 NOTE — ED Provider Notes (Signed)
Medical screening examination/treatment/procedure(s) were performed by non-physician practitioner and as supervising physician I was immediately available for consultation/collaboration.   David H Yao, MD 10/06/12 1559 

## 2012-12-29 ENCOUNTER — Emergency Department (HOSPITAL_COMMUNITY)
Admission: EM | Admit: 2012-12-29 | Discharge: 2012-12-29 | Disposition: A | Discharge status: Home / Self Care | Payer: Self-pay | Attending: Emergency Medicine | Admitting: Emergency Medicine

## 2012-12-29 DIAGNOSIS — R21 Rash and other nonspecific skin eruption: Secondary | ICD-10-CM | POA: Insufficient documentation

## 2012-12-29 DIAGNOSIS — Z8614 Personal history of Methicillin resistant Staphylococcus aureus infection: Secondary | ICD-10-CM | POA: Insufficient documentation

## 2012-12-29 DIAGNOSIS — Z3202 Encounter for pregnancy test, result negative: Secondary | ICD-10-CM | POA: Insufficient documentation

## 2012-12-29 DIAGNOSIS — L738 Other specified follicular disorders: Secondary | ICD-10-CM | POA: Insufficient documentation

## 2012-12-29 DIAGNOSIS — L739 Follicular disorder, unspecified: Secondary | ICD-10-CM

## 2012-12-29 LAB — WET PREP, GENITAL
Clue Cells Wet Prep HPF POC: NONE SEEN
Trich, Wet Prep: NONE SEEN
WBC, Wet Prep HPF POC: NONE SEEN
Yeast Wet Prep HPF POC: NONE SEEN

## 2012-12-29 MED ORDER — CEPHALEXIN 500 MG PO CAPS: 500.0000 mg | ORAL_CAPSULE | Freq: Four times a day (QID) | ORAL | Status: DC

## 2012-12-29 MED ORDER — DIPHENHYDRAMINE HCL 25 MG PO CAPS
50.0000 mg | ORAL_CAPSULE | Freq: Once | ORAL | Status: AC
  Administered 2012-12-29: 50 mg via ORAL
  Filled 2012-12-29: qty 2

## 2012-12-29 NOTE — ED Notes (Signed)
Report given to Eme,rn

## 2012-12-29 NOTE — ED Provider Notes (Signed)
CSN: [id number]     Arrival date & time 12/29/12  1750 History   First MD Initiated Contact with Patient 12/29/12 1823     Chief Complaint  Patient presents with  . Vaginal Pain   (Consider location/radiation/quality/duration/timing/severity/associated sxs/prior Treatment) HPI Pt is a 27yo female c/o vaginal pain and rash.  Reports shaving for the first time in her groin and vaginal area 4 days ago.  2 days later she broke out into a painful, burning stinging rash that is constant, 8/10, worse with palpation but associated with itching so pt has been scratching, making rash worse.  She has tried neosporin without relief.  Reports last sexual encounter was 3months ago but reports she is a lesbian and has never had intercourse with a female. Denies recent use of hot tub. Denies fever, n/v/d. Denies vaginal discharge or bleeding. Denies dysuria or blood in urine.  LNMP: 9/20.  Pt is not on birth control.  Past Medical History  Diagnosis Date  . MRSA (methicillin resistant staph aureus) culture positive    No past surgical history on file. No family history on file. History  Substance Use Topics  . Smoking status: Never Smoker   . Smokeless tobacco: Not on file  . Alcohol Use: No   OB History   Grav Para Term Preterm Abortions TAB SAB Ect Mult Living                 Review of Systems  Gastrointestinal: Negative for nausea, vomiting, abdominal pain and diarrhea.  Genitourinary: Negative for dysuria, hematuria, flank pain, vaginal bleeding, vaginal discharge, vaginal pain and pelvic pain.  Skin: Positive for rash.  All other systems reviewed and are negative.    Allergies  Review of patient's allergies indicates no known allergies.  Home Medications   Current Outpatient Rx  Name  Route  Sig  Dispense  Refill  . cephALEXin (KEFLEX) 500 MG capsule   Oral   Take 1 capsule (500 mg total) by mouth 4 (four) times daily.   40 capsule   0    BP 151/78  Pulse 96  Temp(Src) 98.9 F  (37.2 C) (Oral)  Resp 16  Wt 210 lb 2 oz (95.312 kg)  SpO2 99%  LMP 11/27/2012 Physical Exam  Nursing note and vitals reviewed. Constitutional: She appears well-developed and well-nourished. No distress.  HENT:  Head: Normocephalic and atraumatic.  Eyes: Conjunctivae are normal. No scleral icterus.  Neck: Normal range of motion.  Cardiovascular: Normal rate, regular rhythm and normal heart sounds.   Pulmonary/Chest: Effort normal and breath sounds normal. No respiratory distress. She has no wheezes. She has no rales. She exhibits no tenderness.  Abdominal: Soft. Bowel sounds are normal. She exhibits no distension and no mass. There is no tenderness. There is no rebound and no guarding. Hernia confirmed negative in the right inguinal area and confirmed negative in the left inguinal area.  Genitourinary: Vagina normal. Pelvic exam was performed with patient supine. No labial fusion. There is no rash, tenderness, lesion or injury on the right labia. There is no rash, tenderness, lesion or injury on the left labia. No erythema, tenderness or bleeding around the vagina. No foreign body around the vagina. No signs of injury around the vagina. No vaginal discharge found.  Diffuse pustular rash in inguinal folds. No surrounding erythema or induration. Exquisite tenderness to palpation.  No lesions on labia or in vaginal canal. No vaginal discharge or bleeding. No CMT, adnexal tenderness or masses.   Musculoskeletal:  Normal range of motion.  Lymphadenopathy:       Right: No inguinal adenopathy present.       Left: No inguinal adenopathy present.  Neurological: She is alert.  Skin: Skin is warm and dry. She is not diaphoretic.    ED Course  Procedures (including critical care time) Labs Review Labs Reviewed  WET PREP, GENITAL  GC/CHLAMYDIA PROBE AMP  POCT PREGNANCY, URINE   Imaging Review No results found.  EKG Interpretation   None       MDM   1. Folliculitis    Pt presenting  with folliculitis appearing rash. Rash is not characteristic for genital herpes.  Pelvic exam: significant for pustular rash in left and right groin/inguinal folds with no surrounding erythema or induration. No active drainage. No vaginal discharge. No CMT, adnexal tenderness or masses.  No UA obtained as pt denied urinary symptoms or pelvic pain. Denies LBP and lower abdominal pain. No fever, n/v/d.  Urine preg: negative Wet prep: normal  Will tx for folliculitis.  Rx: Keflex x10 days. All labs/imaging/findings discussed with patient. All questions answered and concerns addressed. Will discharge pt home and [facility] . Return precautions given. Pt verbalized understanding and agreement with tx plan. Vitals: unremarkable. Discharged in stable condition.        Junius Finner, PA-C 12/30/12 [phone]

## 2012-12-29 NOTE — ED Notes (Signed)
Reports shaving vaginal area 4 days ago and 2 days ago developed rash to vaginal area and internal itchiness. Vaginal area inflamed.  Denies discharge, last sexual encounter 3 months ago.

## 2012-12-30 LAB — GC/CHLAMYDIA PROBE AMP
CT Probe RNA: NEGATIVE
GC Probe RNA: NEGATIVE

## 2012-12-30 NOTE — ED Provider Notes (Signed)
Medical screening examination/treatment/procedure(s) were performed by non-physician practitioner and as supervising physician I was immediately available for consultation/collaboration.    Junius Argyle, MD 12/30/12 [phone]

## 2013-07-29 ENCOUNTER — Emergency Department (HOSPITAL_COMMUNITY)
Admission: EM | Admit: 2013-07-29 | Discharge: 2013-07-29 | Disposition: A | Discharge status: Home / Self Care | Payer: Self-pay | Attending: Emergency Medicine | Admitting: Emergency Medicine

## 2013-07-29 ENCOUNTER — Encounter (HOSPITAL_COMMUNITY): Payer: Self-pay | Admitting: Emergency Medicine

## 2013-07-29 ENCOUNTER — Emergency Department (HOSPITAL_COMMUNITY): Payer: Self-pay

## 2013-07-29 DIAGNOSIS — M25569 Pain in unspecified knee: Secondary | ICD-10-CM

## 2013-07-29 DIAGNOSIS — M25462 Effusion, left knee: Secondary | ICD-10-CM

## 2013-07-29 DIAGNOSIS — M25469 Effusion, unspecified knee: Secondary | ICD-10-CM | POA: Insufficient documentation

## 2013-07-29 DIAGNOSIS — Z8614 Personal history of Methicillin resistant Staphylococcus aureus infection: Secondary | ICD-10-CM | POA: Insufficient documentation

## 2013-07-29 MED ORDER — MELOXICAM 15 MG PO TABS: 15.0000 mg | ORAL_TABLET | Freq: Every day | ORAL | Status: DC

## 2013-07-29 MED ORDER — HYDROCODONE-ACETAMINOPHEN 5-325 MG PO TABS: 1.0000 | ORAL_TABLET | ORAL | Status: DC | PRN

## 2013-07-29 MED ORDER — HYDROCODONE-ACETAMINOPHEN 5-325 MG PO TABS
1.0000 | ORAL_TABLET | Freq: Once | ORAL | Status: AC
  Administered 2013-07-29: 1 via ORAL
  Filled 2013-07-29: qty 1

## 2013-07-29 NOTE — Discharge Instructions (Signed)
You were seen and evaluated for your left knee pains. Your x-rays do not show any concerning findings to explain your pain. Please followup with a primary care provider or an orthopedic specialist for continued evaluation and treatment of your knee pain. Use rest, ice, compression and elevation at home to reduce pain and swelling.   Knee Pain Knee pain can be a result of an injury or other medical conditions. Treatment will depend on the cause of your pain. HOME CARE  Only take medicine as told by your doctor.  Keep a healthy weight. Being overweight can make the knee hurt more.  Stretch before exercising or playing sports.  If there is constant knee pain, change the way you exercise. Ask your doctor for advice.  Make sure shoes fit well. Choose the right shoe for the sport or activity.  Protect your knees. Wear kneepads if needed.  Rest when you are tired. GET HELP RIGHT AWAY IF:   Your knee pain does not stop.  Your knee pain does not get better.  Your knee joint feels hot to the touch.  You have a fever. MAKE SURE YOU:   Understand these instructions.  Will watch this condition.  Will get help right away if you are not doing well or get worse. Document Released: 05/23/2008 Document Revised: 05/19/2011 Document Reviewed: 05/23/2008 [facility] Patient Information 2014 [place], [place].    Knee Effusion The medical term for having fluid in your knee is effusion. This is often due to an internal derangement of the knee. This means something is wrong inside the knee. Some of the causes of fluid in the knee may be torn cartilage, a torn ligament, or bleeding into the joint from an injury. Your knee is likely more difficult to bend and move. This is often because there is increased pain and pressure in the joint. The time it takes for recovery from a knee effusion depends on different factors, including:   Type of injury.  Your age.  Physical and medical  conditions.  Rehabilitation Strategies. How long you will be away from your normal activities will depend on what kind of knee problem you have and how much damage is present. Your knee has two types of cartilage. Articular cartilage covers the bone ends and lets your knee bend and move smoothly. Two menisci, thick pads of cartilage that form a rim inside the joint, help absorb shock and stabilize your knee. Ligaments bind the bones together and support your knee joint. Muscles move the joint, help support your knee, and take stress off the joint itself. CAUSES  Often an effusion in the knee is caused by an injury to one of the menisci. This is often a tear in the cartilage. Recovery after a meniscus injury depends on how much meniscus is damaged and whether you have damaged other knee tissue. Small tears may heal on their own with conservative treatment. Conservative means rest, limited weight bearing activity and muscle strengthening exercises. Your recovery may take up to 6 weeks.  TREATMENT  Larger tears may require surgery. Meniscus injuries may be treated during arthroscopy. Arthroscopy is a procedure in which your surgeon uses a small telescope like instrument to look in your knee. Your caregiver can make a more accurate diagnosis (learning what is wrong) by performing an arthroscopic procedure. If your injury is on the inner margin of the meniscus, your surgeon may trim the meniscus back to a smooth rim. In other cases your surgeon will try to repair a  damaged meniscus with stitches (sutures). This may make rehabilitation take longer, but may provide better long term result by helping your knee keep its shock absorption capabilities. Ligaments which are completely torn usually require surgery for repair. HOME CARE INSTRUCTIONS  Use crutches as instructed.  If a brace is applied, use as directed.  Once you are home, an ice pack applied to your swollen knee may help with discomfort and help  decrease swelling.  Keep your knee raised (elevated) when you are not up and around or on crutches.  Only take over-the-counter or prescription medicines for pain, discomfort, or fever as directed by your caregiver.  Your caregivers will help with instructions for rehabilitation of your knee. This often includes strengthening exercises.  You may resume a normal diet and activities as directed. SEEK MEDICAL CARE IF:   There is increased swelling in your knee.  You notice redness, swelling, or increasing pain in your knee.  An unexplained oral temperature above 102 F (38.9 C) develops. SEEK IMMEDIATE MEDICAL CARE IF:   You develop a rash.  You have difficulty breathing.  You have any allergic reactions from medications you may have been given.  There is severe pain with any motion of the knee. MAKE SURE YOU:   Understand these instructions.  Will watch your condition.  Will get help right away if you are not doing well or get worse. Document Released: 05/17/2003 Document Revised: 05/19/2011 Document Reviewed: 07/21/2007 [facility] Patient Information 2014 [place], [place].

## 2013-07-29 NOTE — ED Provider Notes (Signed)
Medical screening examination/treatment/procedure(s) were performed by non-physician practitioner and as supervising physician I was immediately available for consultation/collaboration.   Ashish Rossetti T Ellamarie Naeve, MD 07/29/13 2237 

## 2013-07-29 NOTE — ED Notes (Signed)
Pt c/o L knee pain for several months. Pt states she has had fluid drained from her R knee before and thinks that she has fluid on her L knee now. Pt denies any recent trauma to knee. Pt ambulatory to exam room with steady gait. Pt states she has taken Motrin from pain, but it is not helping.

## 2013-07-29 NOTE — ED Provider Notes (Signed)
CSN: [id number]     Arrival date & time 07/29/13  2019 History  This chart was scribed for non-physician practitioner, Ivonne Andrew, PA-C working with Toy Baker, MD by Luisa Dago, ED scribe. This patient was seen in room WTR5/WTR5 and the patient's care was started at 8:50 PM.   Chief Complaint  Patient presents with  . Knee Pain   The history is provided by the patient. No language interpreter was used.   HPI Comments: Madeline Larsen is a 28 y.o. female who presents to the Emergency Department complaining of worsening left knee pain that started approximately 2 months ago. Pt states that some time ago she had fluid in her right knee, which she had to get drained. She is concerned that the same things may be effecting the left knee. Pt states that the pain is constant. Pt reports taking Ibuprofen every 2 hours with some initial relief, but now its not working. Ms. Dayton Scrape denies any recent injuries or falls. She denies any chest pain, SOB, IV drug usage, fever, chills, diaphoresis, back pain, weakness or numbness in the feet.    Past Medical History  Diagnosis Date  . MRSA (methicillin resistant staph aureus) culture positive    History reviewed. No pertinent past surgical history. No family history on file. History  Substance Use Topics  . Smoking status: Never Smoker   . Smokeless tobacco: Not on file  . Alcohol Use: No   OB History   Grav Para Term Preterm Abortions TAB SAB Ect Mult Living                 Review of Systems  Constitutional: Negative for fever, chills and diaphoresis.  HENT: Negative for congestion, rhinorrhea and sore throat.   Respiratory: Negative for cough and shortness of breath.   Cardiovascular: Negative for chest pain.  Gastrointestinal: Negative for nausea, vomiting, abdominal pain and diarrhea.  Musculoskeletal: Positive for arthralgias. Negative for back pain.  Skin: Negative for color change, rash and wound.  Neurological: Negative for  syncope, numbness and headaches.  All other systems reviewed and are negative.  Allergies  Review of patient's allergies indicates no known allergies.  Home Medications   Prior to Admission medications   Medication Sig Start Date End Date Taking? Authorizing Provider  ibuprofen (ADVIL,MOTRIN) 200 MG tablet Take 800 mg by mouth every 6 (six) hours as needed for moderate pain.   Yes Historical Provider, MD   BP 162/76  Pulse 72  Temp(Src) 98.2 F (36.8 C) (Oral)  Resp 16  SpO2 100%  Physical Exam  Nursing note and vitals reviewed. Constitutional: She appears well-developed and well-nourished. No distress.  HENT:  Head: Normocephalic and atraumatic.  Eyes: Conjunctivae are normal. Right eye exhibits no discharge. Left eye exhibits no discharge.  Neck: Neck supple.  Cardiovascular: Normal rate, regular rhythm and normal heart sounds.  Exam reveals no gallop and no friction rub.   No murmur heard. Pulmonary/Chest: Effort normal and breath sounds normal. No respiratory distress.  Abdominal: Soft. She exhibits no distension. There is no tenderness.  Musculoskeletal: She exhibits tenderness. She exhibits no edema.  Left Knee: Tenderness to the anterior aspect of the knee. No increased laxity with Varus or Valgus test. Anterior and posterior drawer test negative. Good sensation. Limited ROM secondary to pain. Ballottement test positive for small effusion.  Neurological: She is alert.  Skin: Skin is warm and dry.  Psychiatric: She has a normal mood and affect. Her behavior is normal. Thought  content normal.    ED Course  Procedures  DIAGNOSTIC STUDIES: Oxygen Saturation is 100% on RA, normal by my interpretation.    COORDINATION OF CARE: 9:00 PM- Pt advised of plan for treatment and pt agrees.   Imaging Review Dg Knee Complete 4 Views Left  07/29/2013   CLINICAL DATA:  Left patellar pain for 2 months.  EXAM: LEFT KNEE - COMPLETE 4+ VIEW  COMPARISON:  None.  FINDINGS: There is  no evidence of fracture or dislocation. The joint spaces are preserved. No significant degenerative change is seen; the patellofemoral joint is grossly unremarkable in appearance.  No significant joint effusion is seen. The visualized soft tissues are normal in appearance.  IMPRESSION: No evidence of fracture or dislocation. The patella is grossly unremarkable in appearance.   Electronically Signed   By: Roanna RaiderJeffery  Chang M.D.   On: 07/29/2013 21:01     MDM   Final diagnoses:  Knee pain  Knee effusion, left      I personally performed the services described in this documentation, which was scribed in my presence. The recorded information has been reviewed and is accurate.     Angus Sellereter S Kenyatta Keidel, PA-C 07/29/13 2132

## 2013-07-29 NOTE — ED Notes (Signed)
Pt has a ride home.  

## 2013-08-21 ENCOUNTER — Emergency Department (HOSPITAL_COMMUNITY): Payer: Self-pay

## 2013-08-21 ENCOUNTER — Encounter (HOSPITAL_COMMUNITY): Payer: Self-pay | Admitting: Emergency Medicine

## 2013-08-21 ENCOUNTER — Emergency Department (HOSPITAL_COMMUNITY)
Admission: EM | Admit: 2013-08-21 | Discharge: 2013-08-21 | Disposition: A | Discharge status: Home / Self Care | Payer: Self-pay | Attending: Emergency Medicine | Admitting: Emergency Medicine

## 2013-08-21 DIAGNOSIS — R1011 Right upper quadrant pain: Secondary | ICD-10-CM | POA: Insufficient documentation

## 2013-08-21 DIAGNOSIS — R197 Diarrhea, unspecified: Secondary | ICD-10-CM | POA: Insufficient documentation

## 2013-08-21 DIAGNOSIS — Z3202 Encounter for pregnancy test, result negative: Secondary | ICD-10-CM | POA: Insufficient documentation

## 2013-08-21 DIAGNOSIS — R1013 Epigastric pain: Secondary | ICD-10-CM | POA: Insufficient documentation

## 2013-08-21 DIAGNOSIS — R0789 Other chest pain: Secondary | ICD-10-CM | POA: Insufficient documentation

## 2013-08-21 DIAGNOSIS — R112 Nausea with vomiting, unspecified: Secondary | ICD-10-CM | POA: Insufficient documentation

## 2013-08-21 DIAGNOSIS — Z8614 Personal history of Methicillin resistant Staphylococcus aureus infection: Secondary | ICD-10-CM | POA: Insufficient documentation

## 2013-08-21 DIAGNOSIS — R509 Fever, unspecified: Secondary | ICD-10-CM | POA: Insufficient documentation

## 2013-08-21 LAB — URINALYSIS, ROUTINE W REFLEX MICROSCOPIC
Bilirubin Urine: NEGATIVE
Glucose, UA: NEGATIVE mg/dL
HGB URINE DIPSTICK: NEGATIVE
Ketones, ur: 15 mg/dL — AB
Leukocytes, UA: NEGATIVE
Nitrite: NEGATIVE
Protein, ur: NEGATIVE mg/dL
Specific Gravity, Urine: 1.019 (ref 1.005–1.030)
UROBILINOGEN UA: 0.2 mg/dL (ref 0.0–1.0)
pH: 5.5 (ref 5.0–8.0)

## 2013-08-21 LAB — LIPASE, BLOOD: LIPASE: 25 U/L (ref 11–59)

## 2013-08-21 LAB — HEPATIC FUNCTION PANEL
ALT: 14 U/L (ref 0–35)
AST: 27 U/L (ref 0–37)
Albumin: 3.8 g/dL (ref 3.5–5.2)
Alkaline Phosphatase: 40 U/L (ref 39–117)
Total Bilirubin: 0.4 mg/dL (ref 0.3–1.2)
Total Protein: 8.5 g/dL — ABNORMAL HIGH (ref 6.0–8.3)

## 2013-08-21 LAB — CBC
HCT: 37.1 % (ref 36.0–46.0)
Hemoglobin: 12.1 g/dL (ref 12.0–15.0)
MCH: 27.6 pg (ref 26.0–34.0)
MCHC: 32.6 g/dL (ref 30.0–36.0)
MCV: 84.5 fL (ref 78.0–100.0)
PLATELETS: 242 10*3/uL (ref 150–400)
RBC: 4.39 MIL/uL (ref 3.87–5.11)
RDW: 12.5 % (ref 11.5–15.5)
WBC: 4.7 10*3/uL (ref 4.0–10.5)

## 2013-08-21 LAB — BASIC METABOLIC PANEL
BUN: 7 mg/dL (ref 6–23)
CHLORIDE: 98 meq/L (ref 96–112)
CO2: 21 mEq/L (ref 19–32)
CREATININE: 0.91 mg/dL (ref 0.50–1.10)
Calcium: 9.3 mg/dL (ref 8.4–10.5)
GFR, EST NON AFRICAN AMERICAN: 84 mL/min — AB (ref >90)
Glucose, Bld: 107 mg/dL — ABNORMAL HIGH (ref 70–99)
POTASSIUM: 3.6 meq/L — AB (ref 3.7–5.3)
Sodium: 135 mEq/L — ABNORMAL LOW (ref 137–147)

## 2013-08-21 LAB — I-STAT TROPONIN, ED: TROPONIN I, POC: 0.02 ng/mL (ref 0.00–0.08)

## 2013-08-21 LAB — PREGNANCY, URINE: PREG TEST UR: NEGATIVE

## 2013-08-21 MED ORDER — SODIUM CHLORIDE 0.9 % IV BOLUS (SEPSIS)
1000.0000 mL | Freq: Once | INTRAVENOUS | Status: AC
  Administered 2013-08-21: 1000 mL via INTRAVENOUS

## 2013-08-21 MED ORDER — LORAZEPAM 2 MG/ML IJ SOLN
1.0000 mg | Freq: Once | INTRAMUSCULAR | Status: AC
  Administered 2013-08-21: 1 mg via INTRAVENOUS
  Filled 2013-08-21: qty 1

## 2013-08-21 MED ORDER — KETOROLAC TROMETHAMINE 30 MG/ML IJ SOLN
30.0000 mg | Freq: Once | INTRAMUSCULAR | Status: AC
  Administered 2013-08-21: 30 mg via INTRAVENOUS
  Filled 2013-08-21: qty 1

## 2013-08-21 MED ORDER — MORPHINE SULFATE 4 MG/ML IJ SOLN
4.0000 mg | Freq: Once | INTRAMUSCULAR | Status: AC
  Administered 2013-08-21: 4 mg via INTRAVENOUS
  Filled 2013-08-21: qty 1

## 2013-08-21 MED ORDER — TRAMADOL HCL 50 MG PO TABS: 50.0000 mg | ORAL_TABLET | Freq: Four times a day (QID) | ORAL | Status: DC | PRN

## 2013-08-21 MED ORDER — ONDANSETRON 4 MG PO TBDP: 4.0000 mg | ORAL_TABLET | Freq: Three times a day (TID) | ORAL | Status: DC | PRN

## 2013-08-21 MED ORDER — ONDANSETRON 4 MG PO TBDP
8.0000 mg | ORAL_TABLET | Freq: Once | ORAL | Status: AC
  Administered 2013-08-21: 8 mg via ORAL
  Filled 2013-08-21: qty 2

## 2013-08-21 NOTE — Discharge Instructions (Signed)
Please call a primary care doctor from list below to schedule a follow up appointment. Please take pain medication and/or muscle relaxants as prescribed and as needed for pain. Please do not drive on narcotic pain medication or on muscle relaxants. Please take Zofran as prescribed. Please read all discharge instructions and return precautions.    Chest Pain (Nonspecific) It is often hard to give a specific diagnosis for the cause of chest pain. There is always a chance that your pain could be related to something serious, such as a heart attack or a blood clot in the lungs. You need to follow up with your caregiver for further evaluation. CAUSES   Heartburn.  Pneumonia or bronchitis.  Anxiety or stress.  Inflammation around your heart (pericarditis) or lung (pleuritis or pleurisy).  A blood clot in the lung.  A collapsed lung (pneumothorax). It can develop suddenly on its own (spontaneous pneumothorax) or from injury (trauma) to the chest.  Shingles infection (herpes zoster virus). The chest wall is composed of bones, muscles, and cartilage. Any of these can be the source of the pain.  The bones can be bruised by injury.  The muscles or cartilage can be strained by coughing or overwork.  The cartilage can be affected by inflammation and become sore (costochondritis). DIAGNOSIS  Lab tests or other studies, such as X-rays, electrocardiography, stress testing, or cardiac imaging, may be needed to find the cause of your pain.  TREATMENT   Treatment depends on what may be causing your chest pain. Treatment may include:  Acid blockers for heartburn.  Anti-inflammatory medicine.  Pain medicine for inflammatory conditions.  Antibiotics if an infection is present.  You may be advised to change lifestyle habits. This includes stopping smoking and avoiding alcohol, caffeine, and chocolate.  You may be advised to keep your head raised (elevated) when sleeping. This reduces the chance  of acid going backward from your stomach into your esophagus.  Most of the time, nonspecific chest pain will improve within 2 to 3 days with rest and mild pain medicine. HOME CARE INSTRUCTIONS   If antibiotics were prescribed, take your antibiotics as directed. Finish them even if you start to feel better.  For the next few days, avoid physical activities that bring on chest pain. Continue physical activities as directed.  Do not smoke.  Avoid drinking alcohol.  Only take over-the-counter or prescription medicine for pain, discomfort, or fever as directed by your caregiver.  Follow your caregiver's suggestions for further testing if your chest pain does not go away.  Keep any follow-up appointments you made. If you do not go to an appointment, you could develop lasting (chronic) problems with pain. If there is any problem keeping an appointment, you must call to reschedule. SEEK MEDICAL CARE IF:   You think you are having problems from the medicine you are taking. Read your medicine instructions carefully.  Your chest pain does not go away, even after treatment.  You develop a rash with blisters on your chest. SEEK IMMEDIATE MEDICAL CARE IF:   You have increased chest pain or pain that spreads to your arm, neck, jaw, back, or abdomen.  You develop shortness of breath, an increasing cough, or you are coughing up blood.  You have severe back or abdominal pain, feel nauseous, or vomit.  You develop severe weakness, fainting, or chills.  You have a fever. THIS IS AN EMERGENCY. Do not wait to see if the pain will go away. Get medical help at once.  Call your local emergency services (911 in [place] Do not drive yourself to the hospital. MAKE SURE YOU:   Understand these instructions.  Will watch your condition.  Will get help right away if you are not doing well or get worse. Document Released: 12/04/2004 Document Revised: 05/19/2011 Document Reviewed: 09/30/2007 [facility]  Patient Information 2014 [place], [place]. Viral Gastroenteritis Viral gastroenteritis is also known as stomach flu. This condition affects the stomach and intestinal tract. It can cause sudden diarrhea and vomiting. The illness typically lasts 3 to 8 days. Most people develop an immune response that eventually gets rid of the virus. While this natural response develops, the virus can make you quite ill. CAUSES  Many different viruses can cause gastroenteritis, such as rotavirus or noroviruses. You can catch one of these viruses by consuming contaminated food or water. You may also catch a virus by sharing utensils or other personal items with an infected person or by touching a contaminated surface. SYMPTOMS  The most common symptoms are diarrhea and vomiting. These problems can cause a severe loss of body fluids (dehydration) and a body salt (electrolyte) imbalance. Other symptoms may include:  Fever.  Headache.  Fatigue.  Abdominal pain. DIAGNOSIS  Your caregiver can usually diagnose viral gastroenteritis based on your symptoms and a physical exam. A stool sample may also be taken to test for the presence of viruses or other infections. TREATMENT  This illness typically goes away on its own. Treatments are aimed at rehydration. The most serious cases of viral gastroenteritis involve vomiting so severely that you are not able to keep fluids down. In these cases, fluids must be given through an intravenous line (IV). HOME CARE INSTRUCTIONS   Drink enough fluids to keep your urine clear or pale yellow. Drink small amounts of fluids frequently and increase the amounts as tolerated.  Ask your caregiver for specific rehydration instructions.  Avoid:  Foods high in sugar.  Alcohol.  Carbonated drinks.  Tobacco.  Juice.  Caffeine drinks.  Extremely hot or cold fluids.  Fatty, greasy foods.  Too much intake of anything at one time.  Dairy products until 24 to 48 hours after  diarrhea stops.  You may consume probiotics. Probiotics are active cultures of beneficial bacteria. They may lessen the amount and number of diarrheal stools in adults. Probiotics can be found in yogurt with active cultures and in supplements.  Wash your hands well to avoid spreading the virus.  Only take over-the-counter or prescription medicines for pain, discomfort, or fever as directed by your caregiver. Do not give aspirin to children. Antidiarrheal medicines are not recommended.  Ask your caregiver if you should continue to take your regular prescribed and over-the-counter medicines.  Keep all follow-up appointments as directed by your caregiver. SEEK IMMEDIATE MEDICAL CARE IF:   You are unable to keep fluids down.  You do not urinate at least once every 6 to 8 hours.  You develop shortness of breath.  You notice blood in your stool or vomit. This may look like coffee grounds.  You have abdominal pain that increases or is concentrated in one small area (localized).  You have persistent vomiting or diarrhea.  You have a fever.  The patient is a child younger than 3 months, and he or she has a fever.  The patient is a child older than 3 months, and he or she has a fever and persistent symptoms.  The patient is a child older than 3 months, and he or she has  a fever and symptoms suddenly get worse.  The patient is a baby, and he or she has no tears when crying. MAKE SURE YOU:   Understand these instructions.  Will watch your condition.  Will get help right away if you are not doing well or get worse. Document Released: 02/24/2005 Document Revised: 05/19/2011 Document Reviewed: 12/11/2010 [facility] Patient Information 2014 [place], [place].  Establish relationship with primary care doctor as discussed. A resource guide and information on the Affordable Care Act has been provided for your information.    RESOURCE GUIDE  If you do not have a primary care doctor to follow  up with regarding today's visit, please call the [facility] at [phone] to make an appointment. Hours of operation are 10am - 7pm, Monday through Friday, and they have a sliding scale fee.   Insufficient Money for Medicine: Contact [employer]:  call "211" or [facility] [phone].  No Primary Care Doctor: - Call [facility]  [phone] - can help you locate a primary care doctor that  accepts your insurance, provides certain services, etc. - Physician Referral Service- [phone]  Agencies that provide inexpensive medical care: - [facility]  [phone] - [facility]  [phone] - [facility]  [phone] - [facility]  [phone] - [facility]  [phone] [facility]  [phone]  Medicaid-accepting [facility] Providers: - [facility]- [address]  [phone], Mon-Fri 9am-7pm, Sat 9am-1pm - [facility]- [address] [place], [address] [place]  [phone] - [facility]- [address], [address] [place]  [phone] [facility]- [address]  [phone] - Renaye Rakers- [address] [place], [address], [phone]  Only accepts [place] patients after they have their name  applied to their card  Self Pay (no insurance) in [place]: - Sickle Cell Patients: Dr Willey Blade, [facility]  [address] [place], [phone] - [facility]- [address] [place]  [phone]       [facility]- [address]       -     [facility]- see information above (Speak to [employer] if you do not have insurance)       -  [facility]- [address] [place], [phone]       -  [facility]- [address],  [phone]       -  [facility]- [address], [phone]       -  Dr Julio Sicks-  [address], [place], [phone]       -  [facility]- [address] [phone]       -  [facility]- [address], [phone], also [address], [phone]       -    [facility]- [address] [place], [phone], 1st & 3rd Saturday   every month, 10am-1pm  1) Find a Doctor and Pay Out of Pocket Although you won't have to find out who is covered by your insurance plan, it is a good idea to ask around and get recommendations. You will then need to call the office and see if the doctor you have chosen will accept you  as a new patient and what types of options they offer for patients who are self-pay. Some doctors offer discounts or will set up payment plans for their patients who do not have insurance, but you will need to ask so you aren't surprised when you get to your appointment.  2) Contact Your Local Health Department Not all health departments have doctors that can see patients for sick visits, but many do, so it is worth a call to see if yours does. If you don't know where your local health department is, you can check in your phone book. The CDC also has a tool to help you locate your state's health department, and many state websites also have listings of all of their local health departments.  3) Find a Walk-in Clinic If your illness is not likely to be very severe or complicated, you may want to try a walk in clinic. These are popping up all over the country in pharmacies, drugstores, and shopping centers. They're usually staffed by nurse practitioners or physician assistants that have been trained to treat common illnesses and complaints. They're usually fairly quick and inexpensive. However, if you have serious medical issues or chronic medical problems, these are probably not your best option

## 2013-08-21 NOTE — ED Provider Notes (Signed)
CSN: [id number]     Arrival date & time 08/21/13  0908 History   First MD Initiated Contact with Patient 08/21/13 1113     Chief Complaint  Patient presents with  . Chest Pain     (Consider location/radiation/quality/duration/timing/severity/associated sxs/prior Treatment) HPI Comments: Patient is a 28 year old female presenting to the emergency department for multiple complaints. Patient states she's had 5 days of nausea, nonbloody nonbilious vomiting, nonbloody diarrhea (states stools are dark brown) associated subjective fevers and chills, myalgias. She states she's not had any vomiting or diarrhea since yesterday. She states she awoke this 3 AM with severe generalized chest pressure is worsened with deep inspiration. She denies any cough or shortness of breath. No medications taken prior to arrival. No alleviating factors. No sick contacts. PERC negative.   Patient is a 28 y.o. female presenting with chest pain.  Chest Pain Associated symptoms: abdominal pain, fever (subjective), nausea and vomiting   Associated symptoms: no cough, no palpitations and no shortness of breath     Past Medical History  Diagnosis Date  . MRSA (methicillin resistant staph aureus) culture positive    History reviewed. No pertinent past surgical history. History reviewed. No pertinent family history. History  Substance Use Topics  . Smoking status: Never Smoker   . Smokeless tobacco: Not on file  . Alcohol Use: No   OB History   Grav Para Term Preterm Abortions TAB SAB Ect Mult Living                 Review of Systems  Constitutional: Positive for fever (subjective) and chills.  Respiratory: Positive for chest tightness. Negative for cough and shortness of breath.   Cardiovascular: Positive for chest pain. Negative for palpitations and leg swelling.  Gastrointestinal: Positive for nausea, vomiting, abdominal pain and diarrhea.  All other systems reviewed and are negative.     Allergies   Review of patient's allergies indicates no known allergies.  Home Medications   Prior to Admission medications   Medication Sig Start Date End Date Taking? Authorizing Provider  ibuprofen (ADVIL,MOTRIN) 200 MG tablet Take 800 mg by mouth every 6 (six) hours as needed for moderate pain.   Yes Historical Provider, MD  ondansetron (ZOFRAN ODT) 4 MG disintegrating tablet Take 1 tablet (4 mg total) by mouth every 8 (eight) hours as needed for nausea or vomiting. 08/21/13   Lise AuerJennifer L Demarus Latterell, PA-C  traMADol (ULTRAM) 50 MG tablet Take 1 tablet (50 mg total) by mouth every 6 (six) hours as needed. 08/21/13   Alasdair Kleve L Jasen Hartstein, PA-C   BP 117/74  Pulse 72  Temp(Src) 98.6 F (37 C) (Oral)  Resp 14  Wt 199 lb (90.266 kg)  SpO2 99%  LMP 08/14/2013 Physical Exam  Nursing note and vitals reviewed. Constitutional: She is oriented to person, place, and time. She appears well-developed and well-nourished. No distress.  HENT:  Head: Normocephalic and atraumatic.  Right Ear: External ear normal.  Left Ear: External ear normal.  Nose: Nose normal.  Mouth/Throat: Oropharynx is clear and moist. No oropharyngeal exudate.  Eyes: Conjunctivae are normal.  Neck: Normal range of motion. Neck supple.  Cardiovascular: Normal rate, regular rhythm, normal heart sounds and intact distal pulses.   Pulmonary/Chest: Effort normal and breath sounds normal. No respiratory distress. She exhibits tenderness.  Abdominal: Soft. Normal appearance and bowel sounds are normal. She exhibits no distension. There is tenderness in the right upper quadrant and epigastric area. There is no rigidity, no rebound, no guarding, no  CVA tenderness, no tenderness at McBurney's point and negative Murphy's sign.  Musculoskeletal: Normal range of motion. She exhibits no edema.  Neurological: She is alert and oriented to person, place, and time.  Skin: Skin is warm and dry. She is not diaphoretic.  Psychiatric: She has a normal mood  and affect.    ED Course  Procedures (including critical care time) Medications  ondansetron (ZOFRAN-ODT) disintegrating tablet 8 mg (8 mg Oral Given 08/21/13 0928)  sodium chloride 0.9 % bolus 1,000 mL (0 mLs Intravenous Stopped 08/21/13 1333)  morphine 4 MG/ML injection 4 mg (4 mg Intravenous Given 08/21/13 1201)  ketorolac (TORADOL) 30 MG/ML injection 30 mg (30 mg Intravenous Given 08/21/13 1312)  LORazepam (ATIVAN) injection 1 mg (1 mg Intravenous Given 08/21/13 1313)    Labs Review Labs Reviewed  BASIC METABOLIC PANEL - Abnormal; Notable for the following:    Sodium 135 (*)    Potassium 3.6 (*)    Glucose, Bld 107 (*)    GFR calc non Af Amer 84 (*)    All other components within normal limits  HEPATIC FUNCTION PANEL - Abnormal; Notable for the following:    Total Protein 8.5 (*)    All other components within normal limits  URINALYSIS, ROUTINE W REFLEX MICROSCOPIC - Abnormal; Notable for the following:    Ketones, ur 15 (*)    All other components within normal limits  CBC  LIPASE, BLOOD  PREGNANCY, URINE  I-STAT TROPOININ, ED    Imaging Review Dg Chest 2 View  08/21/2013   CLINICAL DATA:  Chest pain.  EXAM: CHEST  2 VIEW  COMPARISON:  06/25/2010  FINDINGS: The heart size and mediastinal contours are within normal limits. Both lungs are clear. The visualized skeletal structures are unremarkable.  IMPRESSION: No active cardiopulmonary disease.   Electronically Signed   By: Charlett Nose M.D.   On: 08/21/2013 11:49     EKG Interpretation   Date/Time:  Sunday August 21 2013 09:14:14 EDT Ventricular Rate:  87 PR Interval:  116 QRS Duration: 78 QT Interval:  354 QTC Calculation: 425 R Axis:   84 Text Interpretation:  Sinus rhythm with sinus arrhythmia with Fusion  complexes Right atrial enlargement Borderline ECG No previous tracing  Confirmed by BEATON  MD, ROBERT ([id number]) on 08/21/2013 12:31:22 PM      MDM   Final diagnoses:  Chest pain, atypical  Nausea vomiting and  diarrhea    Filed Vitals:   08/21/13 1343  BP: 117/74  Pulse: 72  Temp:   Resp: 14   Afebrile, NAD, non-toxic appearing, AAOx4.   1) CP: Patient is to be discharged with recommendation to follow up with PCP in regards to today's hospital visit. Chest pain is not likely of cardiac or pulmonary etiology d/t presentation, perc negative, VSS, no tracheal deviation, no JVD or new murmur, RRR, breath sounds equal bilaterally, EKG without acute abnormalities, negative troponin, and negative CXR.   2) nausea, vomiting, diarrhea: Patient with symptoms consistent with viral gastroenteritis.  Vitals are stable, no fever.  No signs of dehydration, tolerating PO fluids > 6 oz.  Lungs are clear.  No focal abdominal pain, no concern for appendicitis, cholecystitis, pancreatitis, ruptured viscus, UTI, kidney stone, or any other abdominal etiology.  Supportive therapy indicated with return if symptoms worsen.  Patient counseled.  Pt has been advised to return to the ED is CP becomes exertional, associated with diaphoresis or nausea, radiates to left jaw/arm, worsens or becomes concerning in any way.  Pt appears reliable for follow up and is agreeable to discharge.     Jeannetta EllisJennifer L Kerryann Allaire, PA-C 08/21/13 1437

## 2013-08-21 NOTE — ED Notes (Signed)
Pt has multiple complaints. Reports having n/v/d, cold sweats, not feeling well x 1 week. Woke up this am with mid chest pain that occurs when breathing, denies cough. Also reported dark stools.

## 2013-08-25 NOTE — ED Provider Notes (Signed)
Medical screening examination/treatment/procedure(s) were performed by non-physician practitioner and as supervising physician I was immediately available for consultation/collaboration.    Nelia Shiobert L Lauree Yurick, MD 08/25/13 [phone]

## 2013-08-30 ENCOUNTER — Emergency Department (HOSPITAL_COMMUNITY)
Admission: EM | Admit: 2013-08-30 | Discharge: 2013-08-31 | Disposition: A | Discharge status: Home / Self Care | Payer: Self-pay | Attending: Emergency Medicine | Admitting: Emergency Medicine

## 2013-08-30 ENCOUNTER — Encounter (HOSPITAL_COMMUNITY): Payer: Self-pay | Admitting: Emergency Medicine

## 2013-08-30 ENCOUNTER — Emergency Department (HOSPITAL_COMMUNITY): Payer: Self-pay

## 2013-08-30 DIAGNOSIS — R1010 Upper abdominal pain, unspecified: Secondary | ICD-10-CM

## 2013-08-30 DIAGNOSIS — Z8614 Personal history of Methicillin resistant Staphylococcus aureus infection: Secondary | ICD-10-CM | POA: Insufficient documentation

## 2013-08-30 DIAGNOSIS — R1011 Right upper quadrant pain: Secondary | ICD-10-CM | POA: Insufficient documentation

## 2013-08-30 DIAGNOSIS — R079 Chest pain, unspecified: Secondary | ICD-10-CM

## 2013-08-30 DIAGNOSIS — R0602 Shortness of breath: Secondary | ICD-10-CM | POA: Insufficient documentation

## 2013-08-30 LAB — I-STAT TROPONIN, ED: Troponin i, poc: 0 ng/mL (ref 0.00–0.08)

## 2013-08-30 LAB — CBC
HEMATOCRIT: 33.5 % — AB (ref 36.0–46.0)
Hemoglobin: 10.7 g/dL — ABNORMAL LOW (ref 12.0–15.0)
MCH: 27.4 pg (ref 26.0–34.0)
MCHC: 31.9 g/dL (ref 30.0–36.0)
MCV: 85.7 fL (ref 78.0–100.0)
PLATELETS: 309 10*3/uL (ref 150–400)
RBC: 3.91 MIL/uL (ref 3.87–5.11)
RDW: 12.8 % (ref 11.5–15.5)
WBC: 4.4 10*3/uL (ref 4.0–10.5)

## 2013-08-30 LAB — BASIC METABOLIC PANEL
BUN: 9 mg/dL (ref 6–23)
CHLORIDE: 103 meq/L (ref 96–112)
CO2: 26 mEq/L (ref 19–32)
Calcium: 9.5 mg/dL (ref 8.4–10.5)
Creatinine, Ser: 0.78 mg/dL (ref 0.50–1.10)
Glucose, Bld: 90 mg/dL (ref 70–99)
POTASSIUM: 4.3 meq/L (ref 3.7–5.3)
Sodium: 140 mEq/L (ref 137–147)

## 2013-08-30 LAB — POC URINE PREG, ED: PREG TEST UR: NEGATIVE

## 2013-08-30 LAB — PRO B NATRIURETIC PEPTIDE: Pro B Natriuretic peptide (BNP): 61.8 pg/mL (ref 0–125)

## 2013-08-30 MED ORDER — MORPHINE SULFATE 4 MG/ML IJ SOLN
4.0000 mg | Freq: Once | INTRAMUSCULAR | Status: AC
  Administered 2013-08-30: 4 mg via INTRAVENOUS
  Filled 2013-08-30: qty 1

## 2013-08-30 MED ORDER — ONDANSETRON HCL 4 MG/2ML IJ SOLN
4.0000 mg | Freq: Once | INTRAMUSCULAR | Status: AC
  Administered 2013-08-30: 4 mg via INTRAVENOUS
  Filled 2013-08-30: qty 2

## 2013-08-30 MED ORDER — PANTOPRAZOLE SODIUM 40 MG IV SOLR
40.0000 mg | Freq: Once | INTRAVENOUS | Status: AC
  Administered 2013-08-31: 40 mg via INTRAVENOUS
  Filled 2013-08-30: qty 40

## 2013-08-30 MED ORDER — MORPHINE SULFATE 4 MG/ML IJ SOLN
4.0000 mg | Freq: Once | INTRAMUSCULAR | Status: AC
  Administered 2013-08-31: 4 mg via INTRAVENOUS
  Filled 2013-08-30: qty 1

## 2013-08-30 NOTE — ED Notes (Signed)
Ginger ale given to pt who is attempting to belch "I feel gasey"

## 2013-08-30 NOTE — ED Provider Notes (Signed)
CSN: [id number]     Arrival date & time 08/30/13  2101 History   First MD Initiated Contact with Patient 08/30/13 2124     Chief Complaint  Patient presents with  . Chest Pain     (Consider location/radiation/quality/duration/timing/severity/associated sxs/prior Treatment) Patient is a 28 y.o. female presenting with chest pain. The history is provided by the patient.  Chest Pain Pain location:  Substernal area Pain quality: aching, dull and pressure   Pain radiates to:  Does not radiate Pain severity:  Severe Onset quality:  Gradual Timing:  Constant Progression:  Worsening Chronicity:  New Relieved by:  Rest Worsened by:  Movement Ineffective treatments: OTC pain meds (tylenol, motrin). also tried tramadol without relief. Associated symptoms: shortness of breath   Associated symptoms: no abdominal pain, no back pain, no cough, no dizziness, no fever, no headache, no nausea and not vomiting     28 yo F pw CP. Gradual onset about 1 week ago. Constant. Worsening. Dull/aching sensation to center of chest. Non-radiating. Worse with twisting of body or other movement. Associated SOB. No cough or fever. No edema or leg pain. No h/o DVT/PE. Not on hormones/ocp's. No recent immobility.  Had some emesis/diarrhea prior to onset of chest pain. These symptoms resolved.  No abd pain. No urinary changes.  No personal or family h/o cardiac dz.  Denies any trauma.   Past Medical History  Diagnosis Date  . MRSA (methicillin resistant staph aureus) culture positive    History reviewed. No pertinent past surgical history. No family history on file. History  Substance Use Topics  . Smoking status: Never Smoker   . Smokeless tobacco: Not on file  . Alcohol Use: No   OB History   Grav Para Term Preterm Abortions TAB SAB Ect Mult Living                 Review of Systems  Constitutional: Negative for fever and chills.  HENT: Negative for congestion and rhinorrhea.   Respiratory:  Positive for shortness of breath. Negative for cough.   Cardiovascular: Positive for chest pain. Negative for leg swelling.  Gastrointestinal: Negative for nausea, vomiting, abdominal pain and diarrhea.  Genitourinary: Negative for dysuria, flank pain and difficulty urinating.  Musculoskeletal: Negative for back pain.  Neurological: Negative for dizziness and headaches.  All other systems reviewed and are negative.     Allergies  Review of patient's allergies indicates no known allergies.  Home Medications   Prior to Admission medications   Medication Sig Start Date End Date Taking? Authorizing Margia Wiesen  ibuprofen (ADVIL,MOTRIN) 200 MG tablet Take 800 mg by mouth every 6 (six) hours as needed for moderate pain.   Yes Historical Inocencia Murtaugh, MD  traMADol (ULTRAM) 50 MG tablet Take 50 mg by mouth every 6 (six) hours as needed for moderate pain or severe pain. 08/21/13  Yes Jennifer L Piepenbrink, PA-C  HYDROcodone-acetaminophen (NORCO/VICODIN) 5-325 MG per tablet Take 1 tablet by mouth every 6 (six) hours as needed for severe pain. 08/31/13   Stevie Kern, MD  ondansetron (ZOFRAN) 4 MG tablet Take 1 tablet (4 mg total) by mouth every 6 (six) hours as needed for nausea or vomiting. 08/31/13   Stevie Kern, MD   BP 138/68  Pulse 74  Temp(Src) 97.3 F (36.3 C) (Oral)  Resp 18  Ht 5\' 2"  (1.575 m)  Wt 199 lb (90.266 kg)  BMI 36.39 kg/m2  SpO2 99%  LMP 08/14/2013 Physical Exam  Nursing note and vitals reviewed. Constitutional: She  is oriented to person, place, and time. She appears well-developed and well-nourished. No distress.  Sitting up in bed. Speaks in full sentences. Tearful.  HENT:  Head: Normocephalic and atraumatic.  Eyes: Conjunctivae are normal. Right eye exhibits no discharge. Left eye exhibits no discharge.  Neck: No tracheal deviation present.  Cardiovascular: Normal heart sounds and intact distal pulses.   Pulmonary/Chest: Breath sounds normal. No stridor. No respiratory  distress. She has no wheezes. She has no rales. She exhibits tenderness (central chest. no gross deformities.).  Tachypnea to low 20's  Abdominal: Soft. She exhibits no distension. There is tenderness (diffuse upper abdomen, including RUQ). There is no guarding.  Musculoskeletal: She exhibits no edema and no tenderness.  Neurological: She is alert and oriented to person, place, and time.  Skin: Skin is warm and dry.    ED Course  Procedures (including critical care time) Labs Review Labs Reviewed  CBC - Abnormal; Notable for the following:    Hemoglobin 10.7 (*)    HCT 33.5 (*)    All other components within normal limits  BASIC METABOLIC PANEL  PRO B NATRIURETIC PEPTIDE  HEPATIC FUNCTION PANEL  I-STAT TROPOININ, ED  POC URINE PREG, ED    Imaging Review Dg Chest 2 View  08/30/2013   CLINICAL DATA:  The centralized chest pain and shortness of breath.  EXAM: CHEST  2 VIEW  COMPARISON:  08/21/2013  FINDINGS: The heart size and mediastinal contours are within normal limits. Both lungs are clear. The visualized skeletal structures are unremarkable.  IMPRESSION: No active cardiopulmonary disease.   Electronically Signed   By: Burman NievesWilliam  Stevens M.D.   On: 08/30/2013 22:08   [place] Abdomen Complete  08/31/2013   CLINICAL DATA:  Abdominal pain.  Chest pain.  Nausea and vomiting.  EXAM: ULTRASOUND ABDOMEN COMPLETE  COMPARISON:  None.  FINDINGS: Gallbladder:  No gallstones or wall thickening visualized. No sonographic Murphy sign noted.  Common bile duct:  Diameter: 3 mm  Liver:  No focal lesion identified. Within normal limits in parenchymal echogenicity.  IVC:  No abnormality visualized.  Pancreas:  Visualized portion unremarkable.  Spleen:  Size and appearance within normal limits.  Right Kidney:  Length: 9.2 cm. Echogenicity within normal limits. No mass or hydronephrosis visualized.  Left Kidney:  Length: 9.8 cm. Echogenicity within normal limits. No mass or hydronephrosis visualized.  Abdominal  aorta:  No aneurysm visualized.  Other findings:  None.  IMPRESSION: Normal sonographic appearance the abdomen.   Electronically Signed   By: Herbie BaltimoreWalt  Liebkemann M.D.   On: 08/31/2013 00:46     EKG Interpretation   Date/Time:  Tuesday August 30 2013 21:06:04 EDT Ventricular Rate:  74 PR Interval:  124 QRS Duration: 68 QT Interval:  374 QTC Calculation: 415 R Axis:   44 Text Interpretation:  Normal sinus rhythm Normal ECG When compared with  ECG of 08/21/2013, No significant change was found Confirmed by [facility]GLICK  MD,  DAVID ([id number]) on 08/30/2013 10:29:49 PM      MDM   Final diagnoses:  Chest pain, unspecified chest pain type  Pain of upper abdomen    Chest pain. Atypical. Also with some abd pain. Prior n/v resolved.  EKG reassuring. Low risk for ACS. PERC negative.  Some RUQ pain. US negative for acute pathology. LFT's reassuring.  Pain reproducible on exam. Possibly musculoskeletal.  Do not suspect other emergent pathology ie dissection, pancreatitis, obstruction, etc.  Patient discharged home. Return precautions given. To follow up with pcp. patient in  agreement with plan.  Labs and imaging reviewed by myself and considered in medical decision making if ordered. Imaging interpreted by radiology.   Discussed case with Dr. Preston FleetingGlick who is in agreement with assessment and plan.       Stevie Kernyan McLennan, MD 08/31/13 0110

## 2013-08-30 NOTE — ED Notes (Signed)
Pt. reports progressing mid chest pain for several days with SOB , denies nausea or diaphoresis .

## 2013-08-30 NOTE — ED Notes (Signed)
Pt aware urine is needed for testing, cannot go at this time.

## 2013-08-30 NOTE — ED Notes (Signed)
Patient transported to X-ray 

## 2013-08-31 ENCOUNTER — Emergency Department (HOSPITAL_COMMUNITY): Payer: Self-pay

## 2013-08-31 LAB — HEPATIC FUNCTION PANEL
ALT: 10 U/L (ref 0–35)
AST: 17 U/L (ref 0–37)
Albumin: 3.5 g/dL (ref 3.5–5.2)
Alkaline Phosphatase: 41 U/L (ref 39–117)
BILIRUBIN TOTAL: 0.3 mg/dL (ref 0.3–1.2)
Total Protein: 7.7 g/dL (ref 6.0–8.3)

## 2013-08-31 MED ORDER — HYDROCODONE-ACETAMINOPHEN 5-325 MG PO TABS: 2.0000 | ORAL_TABLET | Freq: Four times a day (QID) | ORAL | Status: DC | PRN

## 2013-08-31 MED ORDER — ONDANSETRON HCL 4 MG PO TABS: 4.0000 mg | ORAL_TABLET | Freq: Four times a day (QID) | ORAL | Status: DC | PRN

## 2013-08-31 MED ORDER — HYDROCODONE-ACETAMINOPHEN 5-325 MG PO TABS: 1.0000 | ORAL_TABLET | Freq: Four times a day (QID) | ORAL | Status: DC | PRN

## 2013-08-31 NOTE — ED Notes (Signed)
Patient transported to Ultrasound 

## 2013-08-31 NOTE — ED Notes (Signed)
Pt returned from ultrasound. Pt monitored by 5-lead, bp cuff, and pulse ox.

## 2013-08-31 NOTE — ED Provider Notes (Signed)
28 year old female comes in complaining of chest pain for the last week. She been seen in the ED and was not definite diagnosis but states symptoms have not improved at all. She has a constant pressure in her chest which is worse when she lays flat. She has some nausea but no vomiting. She did have significant nausea and vomiting prior to her first ED visit. Symptoms are not worse with eating. She denies dyspnea or diaphoresis. On exam, lungs are clear and heart has regular rate and rhythm. Abdomen is soft with mild epigastric tenderness and moderate right upper quadrant tenderness with a positive Murphy sign. I am concerned that her symptoms may actually represent biliary colic and she will be sent for ultrasound of the gallbladder.  I saw and evaluated the patient, reviewed the resident's note and I agree with the findings and plan.   EKG Interpretation   Date/Time:  Tuesday August 30 2013 21:06:04 EDT Ventricular Rate:  74 PR Interval:  124 QRS Duration: 68 QT Interval:  374 QTC Calculation: 415 R Axis:   44 Text Interpretation:  Normal sinus rhythm Normal ECG When compared with  ECG of 08/21/2013, No significant change was found Confirmed by [facility]GL[facility]  MD,  DAVID ([id number]) on 08/30/2013 10:29:49 PM        Dione Boozeavid Glick, MD 08/31/13 [id number]

## 2013-08-31 NOTE — Discharge Instructions (Signed)
Abdominal Pain, Women °Abdominal (stomach, pelvic, or belly) pain can be caused by many things. It is important to tell your doctor: °· The location of the pain. °· Does it come and go or is it present all the time? °· Are there things that start the pain (eating certain foods, exercise)? °· Are there other symptoms associated with the pain (fever, nausea, vomiting, diarrhea)? °All of this is helpful to know when trying to find the cause of the pain. °CAUSES  °· Stomach: virus or bacteria infection, or ulcer. °· Intestine: appendicitis (inflamed appendix), regional ileitis (Crohn's disease), ulcerative colitis (inflamed colon), irritable bowel syndrome, diverticulitis (inflamed diverticulum of the colon), or cancer of the stomach or intestine. °· Gallbladder disease or stones in the gallbladder. °· Kidney disease, kidney stones, or infection. °· Pancreas infection or cancer. °· Fibromyalgia (pain disorder). °· Diseases of the female organs: °¨ Uterus: fibroid (non-cancerous) tumors or infection. °¨ Fallopian tubes: infection or tubal pregnancy. °¨ Ovary: cysts or tumors. °¨ Pelvic adhesions (scar tissue). °¨ Endometriosis (uterus lining tissue growing in the pelvis and on the pelvic organs). °¨ Pelvic congestion syndrome (female organs filling up with blood just before the menstrual period). °¨ Pain with the menstrual period. °¨ Pain with ovulation (producing an egg). °¨ Pain with an IUD (intrauterine device, birth control) in the uterus. °¨ Cancer of the female organs. °· Functional pain (pain not caused by a disease, may improve without treatment). °· Psychological pain. °· Depression. °DIAGNOSIS  °Your doctor will decide the seriousness of your pain by doing an examination. °· Blood tests. °· X-rays. °· Ultrasound. °· CT scan (computed tomography, special type of X-ray). °· MRI (magnetic resonance imaging). °· Cultures, for infection. °· Barium enema (dye inserted in the large intestine, to better view it with  X-rays). °· Colonoscopy (looking in intestine with a lighted tube). °· Laparoscopy (minor surgery, looking in abdomen with a lighted tube). °· Major abdominal exploratory surgery (looking in abdomen with a large incision). °TREATMENT  °The treatment will depend on the cause of the pain.  °· Many cases can be observed and treated at home. °· Over-the-counter medicines recommended by your caregiver. °· Prescription medicine. °· Antibiotics, for infection. °· Birth control pills, for painful periods or for ovulation pain. °· Hormone treatment, for endometriosis. °· Nerve blocking injections. °· Physical therapy. °· Antidepressants. °· Counseling with a psychologist or psychiatrist. °· Minor or major surgery. °HOME CARE INSTRUCTIONS  °· Do not take laxatives, unless directed by your caregiver. °· Take over-the-counter pain medicine only if ordered by your caregiver. Do not take aspirin because it can cause an upset stomach or bleeding. °· Try a clear liquid diet (broth or water) as ordered by your caregiver. Slowly move to a bland diet, as tolerated, if the pain is related to the stomach or intestine. °· Have a thermometer and take your temperature several times a day, and record it. °· Bed rest and sleep, if it helps the pain. °· Avoid sexual intercourse, if it causes pain. °· Avoid stressful situations. °· Keep your follow-up appointments and tests, as your caregiver orders. °· If the pain does not go away with medicine or surgery, you may try: °¨ Acupuncture. °¨ Relaxation exercises (yoga, meditation). °¨ Group therapy. °¨ Counseling. °SEEK MEDICAL CARE IF:  °· You notice certain foods cause stomach pain. °· Your home care treatment is not helping your pain. °· You need stronger pain medicine. °· You want your IUD removed. °· You feel faint or   lightheaded.  You develop nausea and vomiting.  You develop a rash.  You are having side effects or an allergy to your medicine. SEEK IMMEDIATE MEDICAL CARE IF:   Your  pain does not go away or gets worse.  You have a fever.  Your pain is felt only in portions of the abdomen. The right side could possibly be appendicitis. The left lower portion of the abdomen could be colitis or diverticulitis.  You are passing blood in your stools (bright red or black tarry stools, with or without vomiting).  You have blood in your urine.  You develop chills, with or without a fever.  You pass out. MAKE SURE YOU:   Understand these instructions.  Will watch your condition.  Will get help right away if you are not doing well or get worse. Document Released: 12/22/2006 Document Revised: 05/19/2011 Document Reviewed: 01/11/2009 [facility] Patient Information 2015 [place], [place]. This information is not intended to replace advice given to you by your health care provider. Make sure you discuss any questions you have with your health care provider.  Chest Pain (Nonspecific) It is often hard to give a specific diagnosis for the cause of chest pain. There is always a chance that your pain could be related to something serious, such as a heart attack or a blood clot in the lungs. You need to follow up with your health care provider for further evaluation. CAUSES   Heartburn.  Pneumonia or bronchitis.  Anxiety or stress.  Inflammation around your heart (pericarditis) or lung (pleuritis or pleurisy).  A blood clot in the lung.  A collapsed lung (pneumothorax). It can develop suddenly on its own (spontaneous pneumothorax) or from trauma to the chest.  Shingles infection (herpes zoster virus). The chest wall is composed of bones, muscles, and cartilage. Any of these can be the source of the pain.  The bones can be bruised by injury.  The muscles or cartilage can be strained by coughing or overwork.  The cartilage can be affected by inflammation and become sore (costochondritis). DIAGNOSIS  Lab tests or other studies may be needed to find the cause of your pain.  Your health care provider may have you take a test called an ambulatory electrocardiogram (ECG). An ECG records your heartbeat patterns over a 24-hour period. You may also have other tests, such as:  Transthoracic echocardiogram (TTE). During echocardiography, sound waves are used to evaluate how blood flows through your heart.  Transesophageal echocardiogram (TEE).  Cardiac monitoring. This allows your health care provider to monitor your heart rate and rhythm in real time.  Holter monitor. This is a portable device that records your heartbeat and can help diagnose heart arrhythmias. It allows your health care provider to track your heart activity for several days, if needed.  Stress tests by exercise or by giving medicine that makes the heart beat faster. TREATMENT   Treatment depends on what may be causing your chest pain. Treatment may include:  Acid blockers for heartburn.  Anti-inflammatory medicine.  Pain medicine for inflammatory conditions.  Antibiotics if an infection is present.  You may be advised to change lifestyle habits. This includes stopping smoking and avoiding alcohol, caffeine, and chocolate.  You may be advised to keep your head raised (elevated) when sleeping. This reduces the chance of acid going backward from your stomach into your esophagus. Most of the time, nonspecific chest pain will improve within 2-3 days with rest and mild pain medicine.  HOME CARE INSTRUCTIONS  If antibiotics were prescribed, take them as directed. Finish them even if you start to feel better. °· For the next few days, avoid physical activities that bring on chest pain. Continue physical activities as directed. °· Do not use any tobacco products, including cigarettes, chewing tobacco, or electronic cigarettes. °· Avoid drinking alcohol. °· Only take medicine as directed by your health care provider. °· Follow your health care provider's suggestions for further testing if your chest pain  does not go away. °· Keep any follow-up appointments you made. If you do not go to an appointment, you could develop lasting (chronic) problems with pain. If there is any problem keeping an appointment, call to reschedule. °SEEK MEDICAL CARE IF:  °· Your chest pain does not go away, even after treatment. °· You have a rash with blisters on your chest. °· You have a fever. °SEEK IMMEDIATE MEDICAL CARE IF:  °· You have increased chest pain or pain that spreads to your arm, neck, jaw, back, or abdomen. °· You have shortness of breath. °· You have an increasing cough, or you cough up blood. °· You have severe back or abdominal pain. °· You feel nauseous or vomit. °· You have severe weakness. °· You faint. °· You have chills. °This is an emergency. Do not wait to see if the pain will go away. Get medical help at once. Call your local emergency services (911 in [place] Do not drive yourself to the hospital. °MAKE SURE YOU:  °· Understand these instructions. °· Will watch your condition. °· Will get help right away if you are not doing well or get worse. °Document Released: 12/04/2004 Document Revised: 03/01/2013 Document Reviewed: 09/30/2007 °Exit[employer]® Patient Information ©2015 [employer]. This information is not intended to replace advice given to you by your health care provider. Make sure you discuss any questions you have with your health care provider. ° °

## 2013-09-30 ENCOUNTER — Other Ambulatory Visit: Payer: Self-pay | Admitting: Obstetrics & Gynecology

## 2013-09-30 ENCOUNTER — Other Ambulatory Visit (HOSPITAL_COMMUNITY)
Admission: RE | Admit: 2013-09-30 | Discharge: 2013-09-30 | Disposition: A | Discharge status: Home / Self Care | Payer: BC Managed Care – PPO | Source: Ambulatory Visit | Attending: Obstetrics & Gynecology | Admitting: Obstetrics & Gynecology

## 2013-09-30 DIAGNOSIS — M542 Cervicalgia: Secondary | ICD-10-CM | POA: Insufficient documentation

## 2013-10-04 LAB — CYTOLOGY - PAP

## 2014-02-15 ENCOUNTER — Emergency Department (HOSPITAL_COMMUNITY)
Admission: EM | Admit: 2014-02-15 | Discharge: 2014-02-15 | Disposition: A | Discharge status: Home / Self Care | Payer: BC Managed Care – PPO | Attending: Emergency Medicine | Admitting: Emergency Medicine

## 2014-02-15 ENCOUNTER — Encounter (HOSPITAL_COMMUNITY): Payer: Self-pay | Admitting: Adult Health

## 2014-02-15 DIAGNOSIS — M25562 Pain in left knee: Secondary | ICD-10-CM | POA: Diagnosis present

## 2014-02-15 DIAGNOSIS — Z8614 Personal history of Methicillin resistant Staphylococcus aureus infection: Secondary | ICD-10-CM | POA: Insufficient documentation

## 2014-02-15 MED ORDER — HYDROCODONE-ACETAMINOPHEN 5-325 MG PO TABS
1.0000 | ORAL_TABLET | Freq: Once | ORAL | Status: AC
Start: 2014-02-15 — End: 2014-02-15
  Administered 2014-02-15: 1 via ORAL
  Filled 2014-02-15: qty 1

## 2014-02-15 MED ORDER — MELOXICAM 7.5 MG PO TABS: 7.5000 mg | ORAL_TABLET | Freq: Every day | ORAL | Status: DC

## 2014-02-15 MED ORDER — HYDROCODONE-ACETAMINOPHEN 5-325 MG PO TABS: 1.0000 | ORAL_TABLET | Freq: Four times a day (QID) | ORAL | Status: DC | PRN

## 2014-02-15 NOTE — ED Provider Notes (Signed)
CSN: [id number]     Arrival date & time 02/15/14  2201 History  This chart was scribed for non-physician practitioner, Allen DerryMercedes Camprubi-Soms, PA-C working with Mirian MoMatthew Gentry, MD by Gwenyth Oberatherine Macek, ED scribe. This patient was seen in room TR06C/TR06C and the patient's care was started at 10:30 PM  Chief Complaint  Patient presents with  . Knee Pain    Patient is a 28 y.o. female presenting with knee pain. The history is provided by the patient. No language interpreter was used.  Knee Pain Location:  Knee Injury: no   Knee location:  L knee Pain details:    Quality:  Throbbing   Radiates to:  Does not radiate   Severity:  Severe   Onset quality:  Gradual   Timing:  Constant   Progression:  Worsening Chronicity:  Recurrent Dislocation: no   Foreign body present:  No foreign bodies Relieved by:  NSAIDs and acetaminophen Worsened by:  Activity Associated symptoms: swelling   Associated symptoms: no fever, no muscle weakness, no numbness and no tingling     HPI Comments: Madeline Larsen is a 28 y.o. female with a history of b/l knee arthritis who presents to the Emergency Department complaining of gradually worsening, 10/10, throbbing, constant, nonradiating left knee pain that started a few months ago and became worse yesterday. She notes swelling in the knee as associated symptoms. Pt has tried Tramadol, Ibuprofen and Vicodin with relief to symptoms when they are taken together. She denies prior injuries and a history of gout. Pt has a history of knee problems because she has flat feet. 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 Reports that she has needed fluid drained from her knees before, recently had R knee drained. Called her orthopedist to try to schedule an appointment and couldn't get in until 3 weeks from now. Pt denies numbness, tingling, weakness, erythema, warmth, fever, CP, SOB, and calf swelling as associated symptoms.  [facility]  Past Medical History  Diagnosis Date  . MRSA (methicillin resistant staph aureus) culture positive    History reviewed. No pertinent past surgical history. History reviewed. No pertinent family history. History  Substance Use Topics  . Smoking status: Never Smoker   . Smokeless tobacco: Not on file  . Alcohol Use: No   OB History    No data available     Review of Systems  Constitutional: Negative for fever and chills.  Respiratory: Negative for shortness of breath.   Cardiovascular: Negative for chest pain and leg swelling.  Musculoskeletal: Positive for joint swelling and arthralgias. Negative for myalgias.  Skin: Negative for color change.  Neurological: Negative for weakness and numbness.    10 Systems reviewed and all are negative for acute change except as noted in the HPI.  Allergies  Review of patient's allergies indicates no known allergies.  Home Medications   Prior to Admission medications   Medication Sig Start Date End Date Taking? Authorizing Provider  HYDROcodone-acetaminophen (NORCO/VICODIN) 5-325 MG per tablet Take 1 tablet by mouth every 6 (six) hours as needed for severe pain. 08/31/13  Yes Stevie Kernyan McLennan, MD  ibuprofen (ADVIL,MOTRIN) 200 MG tablet Take 800 mg by mouth every 6 (six) hours as needed for moderate pain.   Yes Historical Provider, MD  ondansetron (ZOFRAN) 4 MG tablet Take 1 tablet (4 mg total) by mouth every 6 (six) hours as needed for nausea or vomiting. 08/31/13  Yes Alycia Rossettiyan  Roxy CedarMcLennan, MD  traMADol (ULTRAM) 50 MG tablet Take 50 mg by mouth every 6 (six) hours as needed for moderate pain or severe pain. 08/21/13  Yes Jennifer L Piepenbrink, PA-C   BP 132/64 mmHg  Pulse 75  Temp(Src) 97.9 F (36.6 C) (Oral)  Resp 16  SpO2 100% Physical Exam  Constitutional: She is oriented to person, place, and time. Vital signs are normal. She appears well-developed and well-nourished.  Non-toxic appearance. No distress.  Afebrile, nontoxic  HENT:   Head: Normocephalic and atraumatic.  Mouth/Throat: Oropharynx is clear and moist and mucous membranes are normal.  Eyes: Conjunctivae and EOM are normal. Right eye exhibits no discharge. Left eye exhibits no discharge.  Neck: Normal range of motion. Neck supple.  Cardiovascular: Normal rate and intact distal pulses.   Distal pulses intact  Pulmonary/Chest: Effort normal. No respiratory distress.  Abdominal: Normal appearance. She exhibits no distension.  Musculoskeletal:       Left knee: She exhibits decreased range of motion (due to pain) and effusion. She exhibits no ecchymosis, no deformity, no erythema, normal alignment, no LCL laxity, normal patellar mobility, no bony tenderness and no MCL laxity. Tenderness found. Medial joint line and lateral joint line tenderness noted.  L knee with diminished ROM due to pain, mild effusion noted, without warmth or erythema, no deformity or bruising, no varus/valgus laxity, no abnormal alignment or patellar mobility, no baker's cyst, no bony TTP. Midline joint line TTP medially and laterally. Strength 5/5 in all extremities, sensation grossly intact in all extremities, distal pulses intact, soft compartments. Neg homan's, no pedal edema  Neurological: She is alert and oriented to person, place, and time. She has normal strength. No sensory deficit.  Skin: Skin is warm, dry and intact. No rash noted.  Psychiatric: She has a normal mood and affect.  Nursing note and vitals reviewed.   ED Course  Procedures (including critical care time) DIAGNOSTIC STUDIES: Oxygen Saturation is 100% on RA, normal by my interpretation.    COORDINATION OF CARE: 10:33 PM Discussed treatment plan with pt. Advised pt to treat symptoms with RICE method and arthritis medication. Pt agreed to plan.     Labs Review Labs Reviewed - No data to display  Imaging Review No results found.   EKG Interpretation None      MDM   Final diagnoses:  Left knee pain    28  y.o. female with chronic L knee pain now acutely worsened due to being on her feet all day at her job. 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 Neurovascularly intact with soft compartments. Doubt septic joint or gout. Discussed RICE, compression, crutches as needed, and weight loss. Discussed calling her orthopedic provider to have ongoing management of her chronic issue. Doubt need for emergent imaging. Will give vicodin tab here and rx for mobic and vicodin. I explained the diagnosis and have given explicit precautions to return to the ER including for any other new or worsening symptoms. The patient understands and accepts the medical plan as it's been dictated and I have answered their questions. Discharge instructions concerning home care and prescriptions have been given. The patient is STABLE and is discharged to home in good condition.   I personally performed the services described in this documentation, which was scribed in my presence. The recorded information has been reviewed and is accurate.  BP 132/64 mmHg  Pulse 75  Temp(Src) 97.9 F (36.6 C) (Oral)  Resp 16  SpO2 100%  Meds ordered this encounter  Medications  .  HYDROcodone-acetaminophen (NORCO/VICODIN) 5-325 MG per tablet 1 tablet    Sig:   . meloxicam (MOBIC) 7.5 MG tablet    Sig: Take 1 tablet (7.5 mg total) by mouth daily.    Dispense:  30 tablet    Refill:  0    Order Specific Question:  Supervising Provider    Answer:  Eber Hong D [[id number]]  . HYDROcodone-acetaminophen (NORCO) 5-325 MG per tablet    Sig: Take 1-2 tablets by mouth every 6 (six) hours as needed for severe pain.    Dispense:  10 tablet    Refill:  0    Order Specific Question:  Supervising Provider    Answer:  Vida Roller [address] Camprubi-Soms, PA-C 02/15/14 2252  Mirian Mo, MD 02/20/14 [phone]

## 2014-02-15 NOTE — ED Notes (Signed)
Presents with left knee pain began yesterday, HX of knee trouble-she elevated, tried tramadol, ibuprofen and ice with no relief-pt is shaky when standing on leg due to pain. Pain rated 10

## 2014-02-15 NOTE — Discharge Instructions (Signed)
Wear knee compression and Use crutches as needed for comfort. Ice and elevate knee throughout the day. Use mobic as directed to help with pain. Use vicodin for breakthrough pain. Do not drive or operate machinery with pain medication use. Call orthopedist for ongoing management of your chronic knee pain and arthritis. Return to the ER for changes or worsening symptoms.   Knee Pain Knee pain can be a result of an injury or other medical conditions. Treatment will depend on the cause of your pain. HOME CARE  Only take medicine as told by your doctor.  Keep a healthy weight. Being overweight can make the knee hurt more.  Stretch before exercising or playing sports.  If there is constant knee pain, change the way you exercise. Ask your doctor for advice.  Make sure shoes fit well. Choose the right shoe for the sport or activity.  Protect your knees. Wear kneepads if needed.  Rest when you are tired. GET HELP RIGHT AWAY IF:   Your knee pain does not stop.  Your knee pain does not get better.  Your knee joint feels hot to the touch.  You have a fever. MAKE SURE YOU:   Understand these instructions.  Will watch this condition.  Will get help right away if you are not doing well or get worse. Document Released: 05/23/2008 Document Revised: 05/19/2011 Document Reviewed: 05/23/2008 [facility] Patient Information 2015 [place], [place]. This information is not intended to replace advice given to you by your health care provider. Make sure you discuss any questions you have with your health care provider.  Cryotherapy Cryotherapy means treatment with cold. Ice or gel packs can be used to reduce both pain and swelling. Ice is the most helpful within the first 24 to 48 hours after an injury or flare-up from overusing a muscle or joint. Sprains, strains, spasms, burning pain, shooting pain, and aches can all be eased with ice. Ice can also be used when recovering from surgery. Ice is effective,  has very few side effects, and is safe for most people to use. PRECAUTIONS  Ice is not a safe treatment option for people with:  Raynaud phenomenon. This is a condition affecting small blood vessels in the extremities. Exposure to cold may cause your problems to return.  Cold hypersensitivity. There are many forms of cold hypersensitivity, including:  Cold urticaria. Red, itchy hives appear on the skin when the tissues begin to warm after being iced.  Cold erythema. This is a red, itchy rash caused by exposure to cold.  Cold hemoglobinuria. Red blood cells break down when the tissues begin to warm after being iced. The hemoglobin that carry oxygen are passed into the urine because they cannot combine with blood proteins fast enough.  Numbness or altered sensitivity in the area being iced. If you have any of the following conditions, do not use ice until you have discussed cryotherapy with your caregiver:  Heart conditions, such as arrhythmia, angina, or chronic heart disease.  High blood pressure.  Healing wounds or open skin in the area being iced.  Current infections.  Rheumatoid arthritis.  Poor circulation.  Diabetes. Ice slows the blood flow in the region it is applied. This is beneficial when trying to stop inflamed tissues from spreading irritating chemicals to surrounding tissues. However, if you expose your skin to cold temperatures for too long or without the proper protection, you can damage your skin or nerves. Watch for signs of skin damage due to cold. HOME CARE INSTRUCTIONS  Follow these tips to use ice and cold packs safely.  Place a dry or damp towel between the ice and skin. A damp towel will cool the skin more quickly, so you may need to shorten the time that the ice is used.  For a more rapid response, add gentle compression to the ice.  Ice for no more than 10 to 20 minutes at a time. The bonier the area you are icing, the less time it will take to get the  benefits of ice.  Check your skin after 5 minutes to make sure there are no signs of a poor response to cold or skin damage.  Rest 20 minutes or more between uses.  Once your skin is numb, you can end your treatment. You can test numbness by very lightly touching your skin. The touch should be so light that you do not see the skin dimple from the pressure of your fingertip. When using ice, most people will feel these normal sensations in this order: cold, burning, aching, and numbness.  Do not use ice on someone who cannot communicate their responses to pain, such as small children or people with dementia. HOW TO MAKE AN ICE PACK Ice packs are the most common way to use ice therapy. Other methods include ice massage, ice baths, and cryosprays. Muscle creams that cause a cold, tingly feeling do not offer the same benefits that ice offers and should not be used as a substitute unless recommended by your caregiver. To make an ice pack, do one of the following:  Place crushed ice or a bag of frozen vegetables in a sealable plastic bag. Squeeze out the excess air. Place this bag inside another plastic bag. Slide the bag into a pillowcase or place a damp towel between your skin and the bag.  Mix 3 parts water with 1 part rubbing alcohol. Freeze the mixture in a sealable plastic bag. When you remove the mixture from the freezer, it will be slushy. Squeeze out the excess air. Place this bag inside another plastic bag. Slide the bag into a pillowcase or place a damp towel between your skin and the bag. SEEK MEDICAL CARE IF:  You develop white spots on your skin. This may give the skin a blotchy (mottled) appearance.  Your skin turns blue or pale.  Your skin becomes waxy or hard.  Your swelling gets worse. MAKE SURE YOU:   Understand these instructions.  Will watch your condition.  Will get help right away if you are not doing well or get worse. Document Released: 10/21/2010 Document Revised:  07/11/2013 Document Reviewed: 10/21/2010 [facility] Patient Information 2015 [place], [place]. This information is not intended to replace advice given to you by your health care provider. Make sure you discuss any questions you have with your health care provider.

## 2014-04-03 ENCOUNTER — Other Ambulatory Visit: Payer: Self-pay | Admitting: Internal Medicine

## 2014-04-03 DIAGNOSIS — H539 Unspecified visual disturbance: Secondary | ICD-10-CM

## 2014-04-03 DIAGNOSIS — G8929 Other chronic pain: Secondary | ICD-10-CM

## 2014-04-03 DIAGNOSIS — R51 Headache: Principal | ICD-10-CM

## 2014-04-06 ENCOUNTER — Ambulatory Visit
Admission: RE | Admit: 2014-04-06 | Discharge: 2014-04-06 | Disposition: A | Discharge status: Home / Self Care | Payer: BLUE CROSS/BLUE SHIELD | Source: Ambulatory Visit | Attending: Internal Medicine | Admitting: Internal Medicine

## 2014-04-06 DIAGNOSIS — H539 Unspecified visual disturbance: Secondary | ICD-10-CM

## 2014-04-06 DIAGNOSIS — G8929 Other chronic pain: Secondary | ICD-10-CM

## 2014-04-06 DIAGNOSIS — R51 Headache: Principal | ICD-10-CM

## 2014-04-06 NOTE — Discharge Instructions (Signed)
AIDS and the Nervous System If you have been diagnosed with AIDS, you need to know about the effects that AIDS can have on your nervous system. There are two parts to the nervous system: the central nervous system and the peripheral nervous system. The central nervous system is your brain and spinal cord. The peripheral nervous system is all the nerves that go out to your body parts and return signals to your brain and spinal cord. AIDS can damage both parts of your nervous system. HOW CAN AIDS AFFECT MY BRAIN AND NERVOUS SYSTEM? The virus that causes AIDS does not directly invade the cells of your nervous system. However, it does cause swelling and inflammation and can lead to conditions that directly affect the nervous system. This can damage your brain and spinal cord, as well as the nerves running to and from your brain and spinal cord. Other possible sources of nervous system damage include:   Medicines you take to fight AIDS.  Infections and cancers that are more common in people who have AIDS. WHAT ARE THE SIGNS AND SYMPTOMS I MAY HAVE? Signs and symptoms of damage to your nervous system depend on the cause and location of the damage. Damage to your peripheral nervous system is called peripheral neuropathy. Signs and symptoms of peripheral neuropathy include:  Pain.  Numbness.  Increased sensation.  Burning or tingling.  Weakness. Signs and symptoms of central nervous system damage from AIDS include:  Changes in behavior.  Loss of memory.  Inability to think clearly or quickly (cognitive dysfunction).  Clumsiness and loss of balance.  Anxiety or depression.  Loss of vision or speech.  Headache.  Dizziness.  Nausea and vomiting.  Sudden or gradual weakness. WHAT IS THE TREATMENT FOR NEUROLOGICAL PROBLEMS OF AIDS? There is no one single treatment for neurological problems. Treatment depends on the type of problem and its cause.Treatment may include:    Medicines:  High doses of AIDS medicines (antiretroviral therapy) may help reduce many neurological problems.  Strong anti-inflammatory medicines (corticosteroids).  Pain medicine, including over-the-counter and prescription-strength medicines.  Mental health medicines may include antidepressants, stimulants, and sedatives.  Medicines that fight cognitive dysfunction (antidementia medicines).  Therapy:  Talk therapy with a mental health professional.  Physical therapy to treat weakness and balance problems. It is also important to:  Take medicines only as directed by your health care provider.  Tell your health care provider if you develop changes in your thinking, behavior, or movement.  Keep all follow-up visits as directed by your health care provider. This is important. Document Released: 02/14/2002 Document Revised: 07/11/2013 Document Reviewed: 02/24/2005 [facility] Patient Information 2015 [place], [place]. This information is not intended to replace advice given to you by your health care provider. Make sure you discuss any questions you have with your health care provider.  Acupuncture Acupuncture began in [place] some 3,000 years ago. It has been widely used in [place] since the early 1900s to relieve pain. A growing number of doctors find this method to be useful but cannot medically explain how it works. Patients are asking about acupuncture more often, especially when standard treatments have not made their pain go away. The theory that, in part, acupuncture works through the release of brain neurotransmitters is gaining scientific support. Some evidence shows that acupuncture causes a release of endorphins in the brain. Endorphins are chemicals the body produces to feel good or to feel less pain. PROCEDURE To treat pain, caregivers choose exact points to place needles. Points are  chosen from within the same nerve (neural) segment next to the area of pain. Points may also be  chosen from the arms and legs where many muscles are located. One of the most effective acupuncture points, Hoku, is located at the base of the thumb. The thumb has the largest number of connections to the brain. TREATMENT   Acupuncture needles are placed deeply into a muscle. This causes changes in the central nervous system that make pain go away. The relief from pain lasts a long time.  Other forms of treatment such as moxibustion may be used at the acupuncture point. This is a form of heat therapy.  A variety of massage and movement techniques may also be used to relieve pain. National Headache Foundation [facility](NHF) Document Released: 02/27/2003 Document Revised: 06/21/2012 Document Reviewed: 08/10/2008 [facility] Patient Information 2015 [place], [place]. This information is not intended to replace advice given to you by your health care provider. Make sure you discuss any questions you have with your health care provider.

## 2014-07-25 IMAGING — CR DG KNEE COMPLETE 4+V*L*
5 series · 5 of 5 positions shown · non-contrast
Comparison: None.

CLINICAL DATA: Left patellar pain for 2 months.

EXAM:
LEFT KNEE - COMPLETE 4+ VIEW

[t knee ap left]
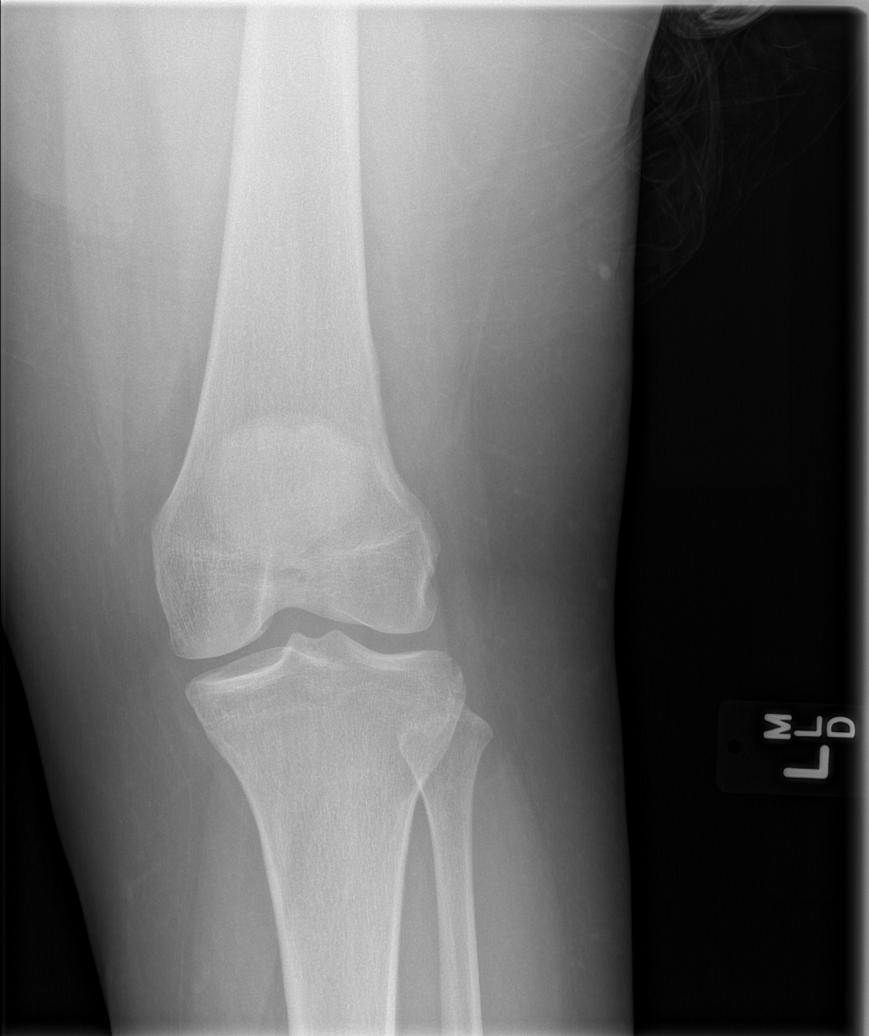

[t knee obl left (1 of 2)]
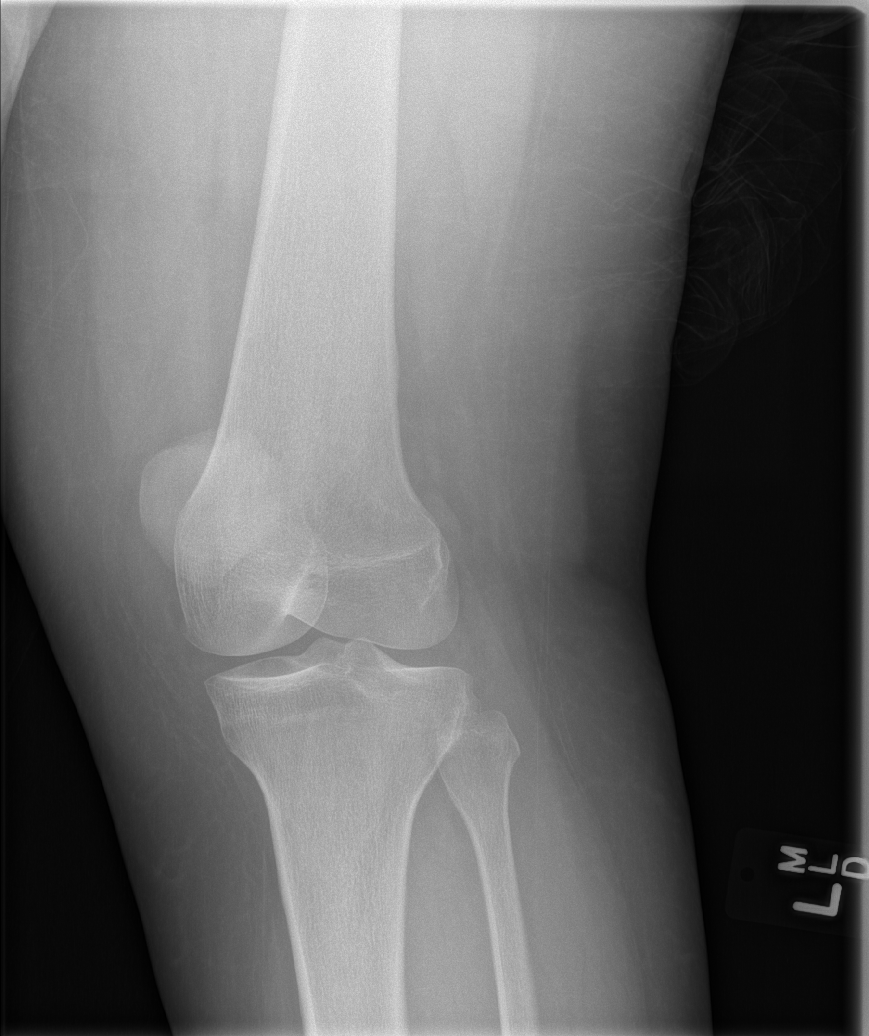

[t knee obl left (2 of 2)]
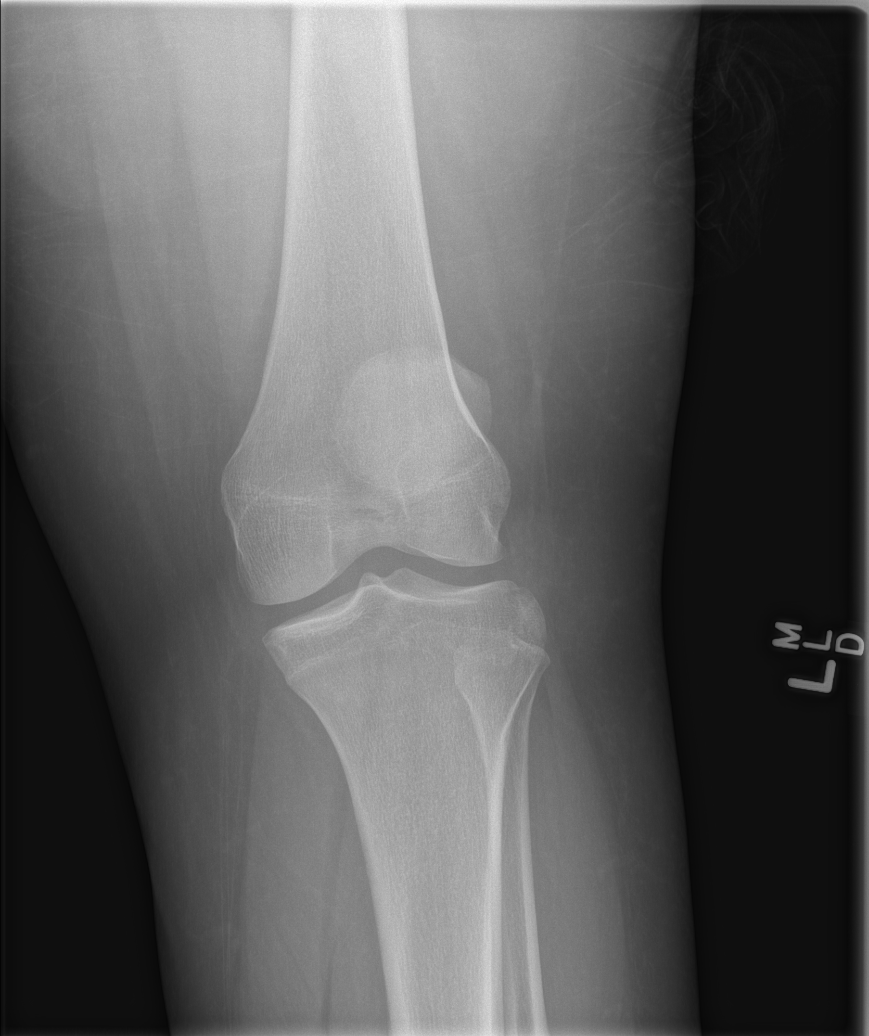

[t knee lat left (1 of 2)]
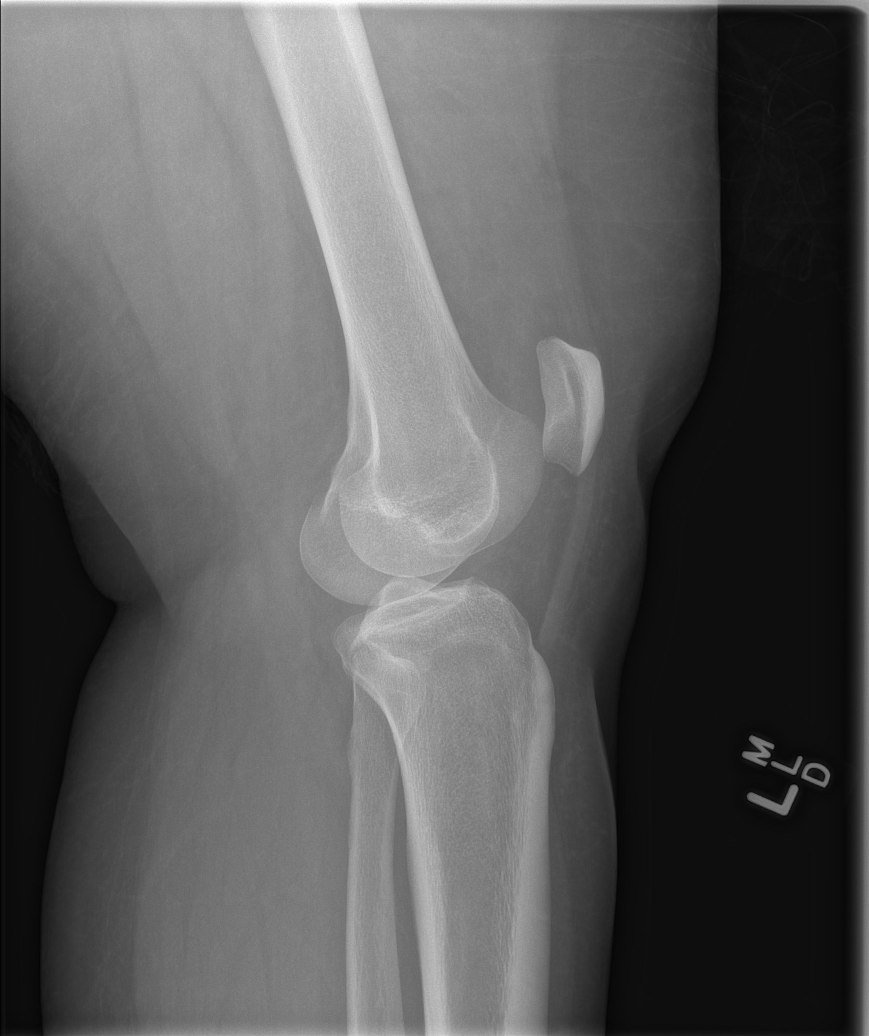

[t knee lat left (2 of 2)]
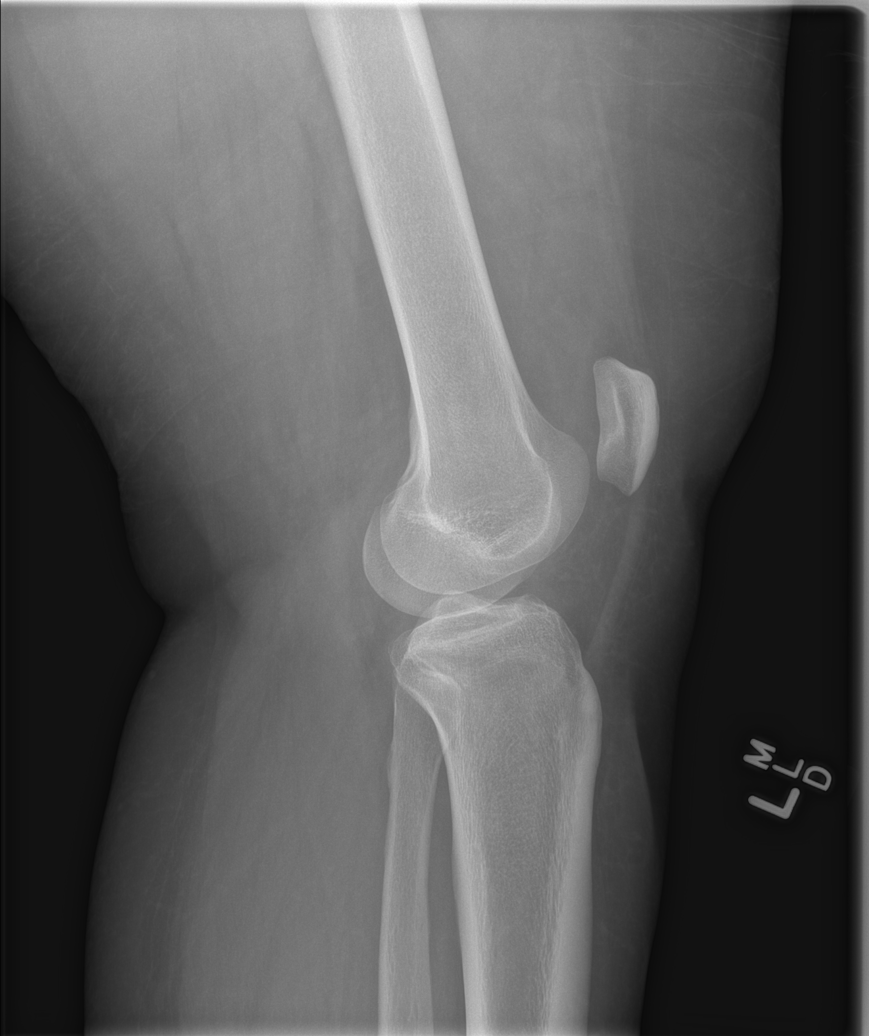

[5 of 5 positions shown; findings below may reference images not displayed]

FINDINGS: There is no evidence of fracture or dislocation. The joint spaces
are preserved. No significant degenerative change is seen; the
patellofemoral joint is grossly unremarkable in appearance.

No significant joint effusion is seen. The visualized soft tissues
are normal in appearance.
IMPRESSION: No evidence of fracture or dislocation. The patella is grossly
unremarkable in appearance.

## 2014-09-04 ENCOUNTER — Ambulatory Visit (INDEPENDENT_AMBULATORY_CARE_PROVIDER_SITE_OTHER): Payer: BLUE CROSS/BLUE SHIELD | Admitting: Podiatry

## 2014-09-04 ENCOUNTER — Ambulatory Visit (INDEPENDENT_AMBULATORY_CARE_PROVIDER_SITE_OTHER): Payer: BLUE CROSS/BLUE SHIELD

## 2014-09-04 ENCOUNTER — Encounter: Payer: Self-pay | Admitting: Podiatry

## 2014-09-04 VITALS — BP 136/85 | HR 58 | Temp 97.8°F | Resp 14

## 2014-09-04 DIAGNOSIS — R52 Pain, unspecified: Secondary | ICD-10-CM

## 2014-09-04 DIAGNOSIS — M2010 Hallux valgus (acquired), unspecified foot: Secondary | ICD-10-CM

## 2014-09-04 NOTE — Patient Instructions (Signed)
Pre-Operative Instructions  Congratulations, you have decided to take an important step to improving your quality of life.  You can be assured that the doctors of [facility] will be with you every step of the way.  1. Plan to be at the surgery center/hospital at least 1 (one) hour prior to your scheduled time unless otherwise directed by the surgical center/hospital staff.  You must have a responsible adult accompany you, remain during the surgery and drive you home.  Make sure you have directions to the surgical center/hospital and know how to get there on time. 2. For hospital based surgery you will need to obtain a history and physical form from your family physician within 1 month prior to the date of surgery- we will give you a form for you primary physician.  3. We make every effort to accommodate the date you request for surgery.  There are however, times where surgery dates or times have to be moved.  We will contact you as soon as possible if a change in schedule is required.   4. No Aspirin/Ibuprofen for one week before surgery.  If you are on aspirin, any non-steroidal anti-inflammatory medications (Mobic, Aleve, Ibuprofen) you should stop taking it 7 days prior to your surgery.  You make take Tylenol  For pain prior to surgery.  5. Medications- If you are taking daily heart and blood pressure medications, seizure, reflux, allergy, asthma, anxiety, pain or diabetes medications, make sure the surgery center/hospital is aware before the day of surgery so they may notify you which medications to take or avoid the day of surgery. 6. No food or drink after midnight the night before surgery unless directed otherwise by surgical center/hospital staff. 7. No alcoholic beverages 24 hours prior to surgery.  No smoking 24 hours prior to or 24 hours after surgery. 8. Wear loose pants or shorts- loose enough to fit over bandages, boots, and casts. 9. No slip on shoes, sneakers are best. 10. Bring  your boot with you to the surgery center/hospital.  Also bring crutches or a walker if your physician has prescribed it for you.  If you do not have this equipment, it will be provided for you after surgery. 11. If you have not been contracted by the surgery center/hospital by the day before your surgery, call to confirm the date and time of your surgery. 12. Leave-time from work may vary depending on the type of surgery you have.  Appropriate arrangements should be made prior to surgery with your employer. 13. Prescriptions will be provided immediately following surgery by your doctor.  Have these filled as soon as possible after surgery and take the medication as directed. 14. Remove nail polish on the operative foot. 15. Wash the night before surgery.  The night before surgery wash the foot and leg well with the antibacterial soap provided and water paying special attention to beneath the toenails and in between the toes.  Rinse thoroughly with water and dry well with a towel.  Perform this wash unless told not to do so by your physician.  Enclosed: 1 Ice pack (please put in freezer the night before surgery)   1 Hibiclens skin cleaner   Pre-op Instructions  If you have any questions regarding the instructions, do not hesitate to call our office.  [place]: [address] [place], [place] [address] [phone]  [place]: [address] [place], [place] [address] [phone]  [place]: [address]  ,  [address] [phone]  Dr. Richard   Tuchman DPM, Dr. Norman Regal DPM Dr. Richard Sikora DPM, Dr. M. Todd Hyatt DPM, Dr. Kathryn Egerton DPM, Dr. Kalenna Millett, DPM 

## 2014-09-04 NOTE — Progress Notes (Signed)
   Subjective:    Patient ID: Madeline Larsen, female    DOB: September 28, 1985, 29 y.o.   MRN: [id number]  HPI 29 year old female presents the office today for surgical consultation of bilateral bunion deformity. She states that she's had pain to the area over the last several years which is progressed. She states that she has throbbing pain along the inside aspect of the foot over on the bunion deformity. She has pain pretty with shoe gear and shoes cause irritation to the area. She is attended shoe gear modifications, padding, offloading without any resolution of symptoms. She denies any numbness or tingling. Denies any swelling or redness. She states that multiple people in her family have had bunion surgery. No other complaints at this time.   Review of Systems  Endocrine: Positive for cold intolerance.  Musculoskeletal:       Muscle pain , joint pain       Objective:   Physical Exam AAO x3, NAD DP/PT pulses palpable bilaterally, CRT less than 3 seconds Protective sensation intact with Simms Weinstein monofilament, vibratory sensation intact, Achilles tendon reflex intact There is a decrease in medial arch height upon weightbearing bilaterally. 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 There is no significant hypermobility present bilaterally. There is no pain or crepitation with first MTPJ range of motion. There is tenderness along the medial aspect of the first metatarsal head upon the bunion deformity and a slight irritation from shoe gear. There is lateral deviation of the hallux. Submetatarsal 2 hyperkeratotic lesion on the left foot. No other areas of tenderness to bilateral lower extremities. MMT 5/5, ROM WNL.  No open lesions or other pre-ulcerative lesions.  No overlying edema, erythema, increase in warmth to bilateral lower extremities.  No pain with calf compression, swelling, warmth, erythema bilaterally.      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 -X-rays were obtained and reviewed with the patient.  -Treatment options discussed including all alternatives, risks, and complications -I discussed both conservative and surgical treatment options. This time she is attended multiple conservative treatment without any resolution of symptoms. At this time she is requesting surgical intervention to help decrease her pain and deformity. -Given the severity of the bunion and there is no hypermobility discussed a closing base wedge osteotomy left foot. -The incision placement as well as the postoperative course was discussed with the patient. I discussed risks of the surgery which include, but not limited to, infection, bleeding, pain, swelling, need for further surgery, delayed or nonhealing, painful or ugly scar, numbness or sensation changes, over/under correction, recurrence, transfer lesions, further deformity, hardware failure, DVT/PE, loss of toe/foot. Patient understands these risks and wishes to proceed with surgery. The surgical consent was reviewed with the patient all 3 pages were signed. No promises or guarantees were given to the outcome of the procedure. All questions were answered to the best of my ability. Before the surgery the patient was encouraged to call the office if there is any further questions. [facility] an [facility] basis.  Ovid CurdMatthew Wagoner, DPM

## 2014-09-05 DIAGNOSIS — M2010 Hallux valgus (acquired), unspecified foot: Secondary | ICD-10-CM | POA: Insufficient documentation

## 2014-09-08 ENCOUNTER — Telehealth: Payer: Self-pay | Admitting: *Deleted

## 2014-09-08 NOTE — Telephone Encounter (Signed)
"  I'm scheduled for surgery on 09/27/2014 I need to push it back to 11/28/2014."  He can do it September 14th.  "That will be perfect, that way I can save some money."  I called and left a message for Luster LandsbergRenee at [facility] to reschedule patient's surgery from 09/27/2014 to 11/22/2014.

## 2014-10-02 ENCOUNTER — Encounter: Payer: Self-pay | Admitting: Podiatry

## 2014-10-25 ENCOUNTER — Telehealth: Payer: Self-pay | Admitting: *Deleted

## 2014-10-25 NOTE — Telephone Encounter (Signed)
"  I'm calling to verify my surgery date.  Please give me a call.

## 2014-10-26 NOTE — Telephone Encounter (Signed)
I left a message.  You are scheduled for 11/22/2014.  Call if you have any further questions.

## 2014-11-07 ENCOUNTER — Telehealth: Payer: Self-pay | Admitting: *Deleted

## 2014-11-07 NOTE — Telephone Encounter (Signed)
"  I called last week to verify my appointment.  I'm calling today to see if I can reschedule it next year, preferably February or so.  Please give me a call back."

## 2014-11-07 NOTE — Telephone Encounter (Signed)
I am returning your call.  You want to reschedule surgery to February?  "Yes, now is not a good time for me to have surgery.  I want to wait until late February."  Okay, you will probably need to see Dr. Ardelle Anton again prior to having surgery because it will be too long since you signed consent forms.  "Okay, I understand."  So, I'm going to send you to a scheduler for an appointment probably for January then we will get you scheduled.  I don't have a surgery book that far out at this time.  I transferred call to a scheduler.

## 2014-11-27 ENCOUNTER — Encounter: Payer: Self-pay | Admitting: Podiatry

## 2015-01-31 ENCOUNTER — Emergency Department (HOSPITAL_COMMUNITY)
Admission: EM | Admit: 2015-01-31 | Discharge: 2015-01-31 | Disposition: A | Discharge status: Home / Self Care | Payer: Self-pay | Attending: Emergency Medicine | Admitting: Emergency Medicine

## 2015-01-31 ENCOUNTER — Emergency Department (HOSPITAL_COMMUNITY): Payer: BLUE CROSS/BLUE SHIELD

## 2015-01-31 ENCOUNTER — Encounter (HOSPITAL_COMMUNITY): Payer: Self-pay | Admitting: Emergency Medicine

## 2015-01-31 DIAGNOSIS — Z8614 Personal history of Methicillin resistant Staphylococcus aureus infection: Secondary | ICD-10-CM | POA: Insufficient documentation

## 2015-01-31 DIAGNOSIS — M25562 Pain in left knee: Secondary | ICD-10-CM

## 2015-01-31 DIAGNOSIS — M25462 Effusion, left knee: Secondary | ICD-10-CM | POA: Insufficient documentation

## 2015-01-31 MED ORDER — MELOXICAM 7.5 MG PO TABS: 7.5000 mg | ORAL_TABLET | Freq: Every day | ORAL | Status: DC | PRN

## 2015-01-31 MED ORDER — LIDOCAINE HCL (PF) 1 % IJ SOLN
30.0000 mL | Freq: Once | INTRAMUSCULAR | Status: AC
  Administered 2015-01-31: 30 mL
  Filled 2015-01-31: qty 30

## 2015-01-31 MED ORDER — KETOROLAC TROMETHAMINE 60 MG/2ML IM SOLN
60.0000 mg | Freq: Once | INTRAMUSCULAR | Status: AC
  Administered 2015-01-31: 60 mg via INTRAMUSCULAR
  Filled 2015-01-31: qty 2

## 2015-01-31 MED ORDER — TRIAMCINOLONE ACETONIDE 40 MG/ML IJ SUSP
40.0000 mg | Freq: Once | INTRAMUSCULAR | Status: AC
  Administered 2015-01-31: 40 mg via INTRA_ARTICULAR
  Filled 2015-01-31: qty 1

## 2015-01-31 MED ORDER — BUPIVACAINE HCL (PF) 0.5 % IJ SOLN
10.0000 mL | Freq: Once | INTRAMUSCULAR | Status: AC
  Administered 2015-01-31: 10 mL
  Filled 2015-01-31: qty 10

## 2015-01-31 NOTE — ED Notes (Signed)
Patient reports constant left knee pain that started two days ago.  Patient denies jury, left knee is obviously swollen compared to the right.  Patient states right knee also hurts intermittently but not as severe.

## 2015-01-31 NOTE — ED Notes (Signed)
Patient receives testosterone injections, last injection was two weeks ago.

## 2015-01-31 NOTE — ED Provider Notes (Signed)
CSN: [id number]     Arrival date & time 01/31/15  0848 History   First MD Initiated Contact with Patient 01/31/15 [phone]     Chief Complaint  Patient presents with  . Knee Pain     (Consider location/radiation/quality/duration/timing/severity/associated sxs/prior Treatment) HPI   Pt is FTM transgender and goes by the name Madeline Larsen.    Reports left knee pain and swelling x 2 days.  The pain is grinding, feels malaligned and is exacerbated with attempting to flex the knee or with bearing weight.  States he has had chronic intermittent pain and swelling in the left knee in the past, has had fluid drained out of it two years ago in [facility] and has taken NSAIDs as well.  Fell on the knee 2 weeks ago when it went out from under him and has had increasing swelling since then, no more recent injury.  Denies fevers, recent illness, diffuse leg swelling, recent immobilization, hx blood clots.   Has not had an xray of the joint.    Past Medical History  Diagnosis Date  . MRSA (methicillin resistant staph aureus) culture positive    History reviewed. No pertinent past surgical history. History reviewed. No pertinent family history. Social History  Substance Use Topics  . Smoking status: Never Smoker   . Smokeless tobacco: Never Used  . Alcohol Use: No   OB History    No data available     Review of Systems  All other systems reviewed and are negative.     Allergies  Review of patient's allergies indicates no known allergies.  Home Medications   Prior to Admission medications   Medication Sig Start Date End Date Taking? Authorizing Provider  HYDROcodone-acetaminophen (NORCO) 5-325 MG per tablet Take 1-2 tablets by mouth every 6 (six) hours as needed for severe pain. Patient not taking: Reported on 09/04/2014 02/15/14   Mercedes Camprubi-Soms, PA-C  HYDROcodone-acetaminophen (NORCO/VICODIN) 5-325 MG per tablet Take 1 tablet by mouth every 6 (six) hours as needed for severe  pain. Patient not taking: Reported on 09/04/2014 08/31/13   Stevie Kern, MD  ibuprofen (ADVIL,MOTRIN) 200 MG tablet Take 800 mg by mouth every 6 (six) hours as needed for moderate pain.    Historical Provider, MD  meloxicam (MOBIC) 7.5 MG tablet Take 1 tablet (7.5 mg total) by mouth daily. Patient not taking: Reported on 09/04/2014 02/15/14   Mercedes Camprubi-Soms, PA-C  ondansetron (ZOFRAN) 4 MG tablet Take 1 tablet (4 mg total) by mouth every 6 (six) hours as needed for nausea or vomiting. Patient not taking: Reported on 09/04/2014 08/31/13   Stevie Kern, MD  traMADol (ULTRAM) 50 MG tablet Take 50 mg by mouth every 6 (six) hours as needed for moderate pain or severe pain. 08/21/13   Jennifer Piepenbrink, PA-C   BP 126/81 mmHg  Pulse 74  Temp(Src) 98.4 F (36.9 C) (Oral)  Resp 18  SpO2 100% Physical Exam  Constitutional: She appears well-developed and well-nourished. No distress.  HENT:  Head: Normocephalic and atraumatic.  Neck: Neck supple.  Pulmonary/Chest: Effort normal.  Musculoskeletal:  Decreased ROM of left knee with diffuse edema and tenderness anteriorly without erythema or warmth.  Distal sensation and pulses intact.  No soft tissue tenderness.  No calf edema.    Neurological: She is alert.  Skin: She is not diaphoretic.  Nursing note and vitals reviewed.   ED Course  .Joint Aspiration/Arthrocentesis Date/Time: 01/31/2015 3:54 PM Performed by: Trixie Dredge Authorized by: Trixie Dredge Consent: Verbal consent obtained.  Risks and benefits: risks, benefits and alternatives were discussed Consent given by: patient Patient understanding: patient states understanding of the procedure being performed Indications: pain and joint swelling  Body area: knee Joint: left knee Local anesthesia used: yes Local anesthetic: lidocaine 1% without epinephrine Patient sedated: no Preparation: Patient was prepped and draped in the usual sterile fashion. Needle gauge: 18 G Ultrasound  guidance: no Approach: lateral Aspirate: serous and bloody Aspirate amount: 90 mL Triamcinolone amount: 1 mg Bupivacaine 0.50% amount: 5 ml Patient tolerance: Patient tolerated the procedure well with no immediate complications   (including critical care time) Labs Review Labs Reviewed - No data to display  Imaging Review Dg Knee Complete 4 Views Left  01/31/2015  CLINICAL DATA:  Injury 4 weeks ago. Pain last night. Unable to straighten leg or walk on it today. EXAM: LEFT KNEE - COMPLETE 4+ VIEW COMPARISON:  None. FINDINGS: Large joint effusion. No acute bony abnormality. Specifically, no fracture, subluxation, or dislocation. Soft tissues are intact. IMPRESSION: Large joint effusion.  No acute bony abnormality. Electronically Signed   By: Charlett NoseKevin  Dover M.D.   On: 01/31/2015 10:16   I have personally reviewed and evaluated these images and lab results as part of my medical decision-making.   EKG Interpretation None        MDM   Final diagnoses:  Knee effusion, left  Left knee pain    Afebrile, nontoxic patient with chronic left knee pain and intermittent effusion, worse over the past 2 days.  Had fall 2 weeks ago with increasing swelling since then.  Has chronic pain and intermittent effusions in left knee historically.  Xray shows large effusion.  Discussed procedure and medications with Dr Fayrene FearingJames.  Knee aspirated and medications placed with great improvement.  Placed in ace wrap, given crutches.  D/C home with orthopedic follow up, mobic.   Discussed result, findings, treatment, and follow up  with patient.  Pt given return precautions.  Pt verbalizes understanding and agrees with plan.      I doubt any other EMC precluding discharge at this time including, but not necessarily limited to the following: septic joint    Trixie Dredgemily Lyndle Pang, PA-C 01/31/15 1557  Rolland PorterMark James, MD 02/01/15 1046

## 2015-01-31 NOTE — Discharge Instructions (Signed)
Read the information below.  Use the prescribed medication as directed.  Please discuss all new medications with your pharmacist.  You may return to the Emergency Department at any time for worsening condition or any new symptoms that concern you.    If you develop fever, uncontrolled pain, weakness or numbness of the extremity, severe discoloration of the skin, or you are unable to walk or move your knee, return to the ER for a recheck.      Knee Arthrocentesis Knee arthrocentesis is a procedure to remove fluid from your knee joint. The knee joint is a closed space that is lined by a membrane (synovial membrane). This membrane makes fluid to lubricate your joint, but sometimes this space fills with blood or other fluids. This can cause pain, swelling, and stiffness in your knee. You may have this procedure to remove fluid from a swollen knee or to get a sample of knee fluid for testing. Testing your knee fluid can help your health care provider to determine the cause of your knee pain or swelling. It can also help your health care provider to diagnose diseases such as gout, arthritis, and infections. Removing excess fluid from your knee may also be done to relieve your symptoms. LET [facility] CARE PROVIDER KNOW ABOUT:  Any allergies you have.  All medicines you are taking, including vitamins, herbs, eye drops, creams, and over-the-counter medicines.  Previous problems you or members of your family have had with the use of anesthetics.  Any blood disorders you have.  Previous surgeries you have had.  Any medical conditions you may have. RISKS AND COMPLICATIONS Generally, this is a safe procedure. However, problems may occur, including:  Infection.  Bleeding.  Bruising.  Swelling.  Damage to your knee joint.  An allergic reaction to the numbing medicine. BEFORE THE PROCEDURE  Ask your health care provider about changing or stopping your regular medicines. This is especially  important if you are taking diabetes medicines or blood thinners.  Plan to have someone take you home after the procedure. PROCEDURE  You will sit or lie down in a position for your knee to be treated.  Your knee will be cleaned with a germ-killing solution (antiseptic).  You will be given a medicine that numbs the area (local anesthetic). You may feel some stinging.  After your knee becomes numb, a health care provider will insert a long, thin needle into the side of your knee joint. You may feel some pressure.  Your health care provider will pull back the syringe on the needle to remove fluid. Your health care provider may press on your knee to help remove the fluid.  At the end of the procedure, the needle will be removed.  A bandage (dressing) will be placed over the puncture site. The procedure may vary among health care providers and hospitals. AFTER THE PROCEDURE  Your blood pressure, heart rate, breathing rate, and blood oxygen level will be monitored often until the medicines you were given have worn off.   This information is not intended to replace advice given to you by your health care provider. Make sure you discuss any questions you have with your health care provider.   Document Released: 05/15/2006 Document Revised: 03/17/2014 Document Reviewed: 01/04/2014 Elsevier Interactive Patient Education 2016 Else[employer] Inc[employer]  Knee Effusion Knee effusion means that you have excess fluid in your knee joint. This can cause pain and swelling in your knee. This may make your knee more difficult to bend and  move. That is because there is increased pain and pressure in the joint. If there is fluid in your knee, it often means that something is wrong inside your knee, such as severe arthritis, abnormal inflammation, or an infection. Another common cause of knee effusion is an injury to the knee muscles, ligaments, or cartilage. HOME CARE INSTRUCTIONS  Use crutches as directed by your  health care provider.  Wear a knee brace as directed by your health care provider.  Apply ice to the swollen area:  Put ice in a plastic bag.  Place a towel between your skin and the bag.  Leave the ice on for 20 minutes, 2-3 times per day.  Keep your knee raised (elevated) when you are sitting or lying down.  Take medicines only as directed by your health care provider.  Do any rehabilitation or strengthening exercises as directed by your health care provider.  Rest your knee as directed by your health care provider. You may start doing your normal activities again when your health care provider approves.   Keep all follow-up visits as directed by your health care provider. This is important. SEEK MEDICAL CARE IF:  You have ongoing (persistent) pain in your knee. SEEK IMMEDIATE MEDICAL CARE IF:  You have increased swelling or redness of your knee.  You have severe pain in your knee.  You have a fever.   This information is not intended to replace advice given to you by your health care provider. Make sure you discuss any questions you have with your health care provider.   Document Released: 05/17/2003 Document Revised: 03/17/2014 Document Reviewed: 10/10/2013 Elsevier Interactive Patient Education 2016 Elsevier Inc.  Knee Injection, Care After Refer to this sheet in the next few weeks. These instructions provide you with information about caring for yourself after your procedure. Your health care provider may also give you more specific instructions. Your treatment has been planned according to current medical practices, but problems sometimes occur. Call your health care provider if you have any problems or questions after your procedure. WHAT TO EXPECT AFTER THE PROCEDURE After your procedure, it is common to have:  Soreness.  Warmth.  Swelling. You may have more pain, swelling, and warmth than you did before the injection. This reaction may last for about one  day.  HOME CARE INSTRUCTIONS Bathing  If you were given a bandage (dressing), keep it dry until your health care provider says it can be removed. Ask your health care provider when you can start showering or taking a bath. Managing Pain, Stiffness, and Swelling  If directed, apply ice to the injection area:  Put ice in a plastic bag.  Place a towel between your skin and the bag.  Leave the ice on for 20 minutes, 2-3 times per day.  Do not apply heat to your knee.  Raise the injection area above the level of your heart while you are sitting or lying down. Activity  Avoid strenuous activities for as long as directed by your health care provider. Ask your health care provider when you can return to your normal activities. General Instructions  Take medicines only as directed by your health care provider.  Do not take aspirin or other over-the-counter medicines unless your health care provider says you can.  Check your injection site every day for signs of infection. Watch for:  Redness, swelling, or pain.  Fluid, blood, or pus.  Follow your health care provider's instructions about dressing changes and removal. SEEK MEDICAL  CARE IF:  You have symptoms at your injection site that last longer than two days after your procedure.  You have redness, swelling, or pain in your injection area.  You have fluid, blood, or pus coming from your injection site.  You have warmth in your injection area.  You have a fever.  Your pain is not controlled with medicine. SEEK IMMEDIATE MEDICAL CARE IF:  Your knee turns very red.  Your knee becomes very swollen.  Your knee pain is severe.   This information is not intended to replace advice given to you by your health care provider. Make sure you discuss any questions you have with your health care provider.   Document Released: 03/17/2014 Document Reviewed: 03/17/2014 Elsevier Interactive Patient Education [employer]

## 2015-03-14 ENCOUNTER — Ambulatory Visit: Payer: BLUE CROSS/BLUE SHIELD | Admitting: Podiatry

## 2015-03-16 ENCOUNTER — Encounter: Payer: Self-pay | Admitting: Podiatry

## 2015-03-16 ENCOUNTER — Ambulatory Visit (INDEPENDENT_AMBULATORY_CARE_PROVIDER_SITE_OTHER): Payer: BLUE CROSS/BLUE SHIELD | Admitting: Podiatry

## 2015-03-16 DIAGNOSIS — M216X9 Other acquired deformities of unspecified foot: Secondary | ICD-10-CM | POA: Diagnosis not present

## 2015-03-16 DIAGNOSIS — M2010 Hallux valgus (acquired), unspecified foot: Secondary | ICD-10-CM

## 2015-03-16 NOTE — Patient Instructions (Signed)

## 2015-03-19 NOTE — Progress Notes (Signed)
Patient ID: Madeline Larsen, female   DOB: Nov 09, 1985, 30 y.o.   MRN: [id number]  Subjective: Lawanna Kobusngel presents to the office today to discuss surgical intervention for her bunion deformity. She states that her bunions of progresses last appointment and her symptoms continue to deteriorate and she is wanting to discussed surgery at this time. She is previous is scheduled for left closing base wedge osteotomy and possible Akin osteotomy but had rescheduled due to personal issues. She states that she has difficulty wearing shoes despite which she changes into and she is also tried offloading and heavy without any relief. She is also starting to develop a painful lesion under the ball of the foot for which she points the submetatarsal 2 area. She said the left has more painful than the right. No recent injury or trauma. Denies any systemic complaints such as fevers, chills, nausea, vomiting. No acute changes since last appointment, and no other complaints at this time.   Objective: AAO x3, NAD DP/PT pulses palpable bilaterally, CRT less than 3 seconds Protective sensation intact with Simms Weinstein monofilament There is significant HAV deformity present bilaterally the prominent first metatarsal head medially with slight irritation from shoe gear. There is no hypermobility present. There is no pain with first MTPJ range of motion crepitation. There is a submetatarsal 2 hyperkeratotic lesions bilaterally left greater than right. Upon debridement no underlying ulceration, drainage or other signs of infection. There does appear to be a plantarflexed second metatarsal. No other areas of tenderness to bilateral lower extremities. No areas of pinpoint bony tenderness or pain with vibratory sensation. MMT 5/5, ROM WNL. No edema, erythema, increase in warmth to bilateral lower extremities.  No open lesions or pre-ulcerative lesions.  No pain with calf compression, swelling, warmth,  erythema  Assessment: 30 year old female with bilateral HAV, submetatarsal 2 hyperkeratotic lesion  Plan: -All treatment options discussed with the patient including all alternatives, risks, complications.  -Again discussed the x-rays with the patient. Severe HAV deformity is present and elongated second metatarsals present. At this point she is attended conservative treatment any resolution is requesting surgical intervention. I discussed with her closing base wedge osteotomy with possible Akin osteotomy. Given the submetatarsal 2 lesion in the second metatarsal being long discussed second metatarsal osteotomy which she would proceed with as well. -The incision placement as well as the postoperative course was discussed with the patient. I discussed risks of the surgery which include, but not limited to, infection, bleeding, pain, swelling, need for further surgery, delayed or nonhealing, painful or ugly scar, numbness or sensation changes, over/under correction, recurrence, transfer lesions, further deformity, hardware failure, DVT/PE, loss of toe/foot. Patient understands these risks and wishes to proceed with surgery. The surgical consent was reviewed with the patient all 3 pages were signed. No promises or guarantees were given to the outcome of the procedure. All questions were answered to the best of my ability. Before the surgery the patient was encouraged to call the office if there is any further questions. [facility].  Ovid CurdMatthew Wagoner, DPM

## 2015-03-30 ENCOUNTER — Encounter: Payer: Self-pay | Admitting: Podiatry

## 2015-03-30 DIAGNOSIS — M2010 Hallux valgus (acquired), unspecified foot: Secondary | ICD-10-CM

## 2015-04-04 ENCOUNTER — Telehealth: Payer: Self-pay | Admitting: Podiatry

## 2015-04-04 DIAGNOSIS — M21542 Acquired clubfoot, left foot: Secondary | ICD-10-CM | POA: Diagnosis not present

## 2015-04-04 DIAGNOSIS — M2012 Hallux valgus (acquired), left foot: Secondary | ICD-10-CM | POA: Diagnosis not present

## 2015-04-04 NOTE — Telephone Encounter (Signed)
Called patient to see how she was doing. She was currently sleeping and I spoke with her fiance who answered the phone. She is having no issues and there are no questions. They did get the prescriptions post-op. Encouraged to call with any questions or concerns.

## 2015-04-09 ENCOUNTER — Ambulatory Visit (INDEPENDENT_AMBULATORY_CARE_PROVIDER_SITE_OTHER): Payer: BLUE CROSS/BLUE SHIELD

## 2015-04-09 ENCOUNTER — Ambulatory Visit (INDEPENDENT_AMBULATORY_CARE_PROVIDER_SITE_OTHER): Payer: BLUE CROSS/BLUE SHIELD | Admitting: Podiatry

## 2015-04-09 ENCOUNTER — Encounter: Payer: Self-pay | Admitting: Podiatry

## 2015-04-09 VITALS — BP 138/92 | HR 71 | Resp 18

## 2015-04-09 DIAGNOSIS — M2010 Hallux valgus (acquired), unspecified foot: Secondary | ICD-10-CM

## 2015-04-09 DIAGNOSIS — M216X9 Other acquired deformities of unspecified foot: Secondary | ICD-10-CM | POA: Insufficient documentation

## 2015-04-09 DIAGNOSIS — Z9889 Other specified postprocedural states: Secondary | ICD-10-CM | POA: Insufficient documentation

## 2015-04-09 NOTE — Patient Instructions (Signed)
Venous Thromboembolism Prevention Venous thromboembolism (VTE) is a condition in which a blood clot (thrombus) develops in the body. A thrombus usually occurs in a deep vein in the leg or the pelvis (DVT), but it can also occur in the arm. Sometimes, pieces of a thrombus can break off from its original place of development and travel through the bloodstream to other parts of the body. When that happens, the thrombus is called an embolus. An embolus that travels to one or both lungs is called a pulmonary embolism. An embolism can block the blood flow in the blood vessels of other organs as well. VTE is a serious health condition that can cause disability or death. It is very important to get help right away and to not ignore symptoms. HOW CAN A VTE BE PREVENTED?  Exercise regularly. Take a brisk 30 minute walk every day. Staying active and moving around can help you to prevent blood clots.  Avoid sitting or lying in bed for long periods of time without moving your legs. Change your position often, especially during long-distance travel (over 4 hours).  If you are a woman who is over 35 years of age, avoid unnecessary use of medicines that contain estrogen. These include birth control pills and hormone replacement therapy.  Do not smoke, especially if you take estrogen medicines. If you need help quitting, ask your health care provider.  Eat plenty of fruits and vegetables. Ask your health care provider or dietitian if there are foods that you should avoid.  Maintain a weight that is appropriate for your height. Ask your health care provider what weight is healthy for you.  Wear loose-fitting clothing. Avoid constrictive or tight clothing around your legs or waist.  Try not to bump or injure your legs. Avoid crossing your legs when you are sitting.  Do not use pillows under your knees while lying down unless told by your health care provider.  Wear support hose (compression stockings or TED  hose) as told by your health care provider Compression stockings increase blood flow in your legs and can help prevent blood clots. Do not let them bunch up when you are wearing them. HOW CAN I PREVENT VTE WHEN I TRAVEL? Long-distance travel (over 4 hours) can increase the risk of a VTE. To prevent VTE when traveling:  Exercise your legs every hour by standing, stretching, and bending and straightening your legs. If you are traveling by airplane, train, or bus, walk up and down the aisle as often as possible to get your blood moving. If you are traveling by car, stop and get out of the car every hour to exercise your legs and stretch. Other types of exercise might include:  Keeping your feet flat on the ground and raising your toes.  Switching from tightening the muscles in your calves and thighs to relaxing those same muscles while you are sitting.  Pointing and flexing your feet at the ankle joints while you are sitting.  Stay well hydrated while traveling. Drink enough water to keep your urine clear or pale yellow.  Avoid drinking alcohol during long travel. Generally, it is not recommended that you take medicines to prevent DVT during routine travel. HOW CAN VTE BE PREVENTED IF I AM HOSPITALIZED? A VTE may be prevented by taking medicines that are prescribed to prevent blood clots (anticoagulants). You can also help to prevent VTE while in the hospital by taking these actions:  Get out of bed and walk. Ask your health care provider   if this is safe for you to do.  Request the use of a sequential compression device (SCD). This is a machine that pumps air into compression sleeves that are wrapped around your legs.  Request the use of compression stockings, which are tight, elastic stockings that apply pressure to the lower legs. Compression stockings are sometimes used with SCDs. HOW CAN I PREVENT VTE AFTER SURGERY? Understand that there is an increased risk for VTE for the first 4-6 weeks  after surgery. During this time:  Avoid long-distance travel (over 4 hours). If you must travel during this time, ask your health care provider about additional preventive actions that you can take. These might include exercising your arms and legs every hour while you travel.  Avoid sitting or lying still for too long. If possible, get up and walk around one time every hour. Ask your health care provider when this is safe for you to do. SEEK IMMEDIATE MEDICAL CARE IF:  You have new or increased pain, swelling, or redness in an arm or leg.  You have numbness or tingling in an arm or leg.  You have shortness of breath while active or at rest.  You have chest pain.  You have a rapid or irregular heartbeat.  You feel light-headed or dizzy.  You cough up blood.  You notice blood in your vomit, bowel movement, or urine. These symptoms may represent a serious problem that is an emergency. Do not wait to see if the symptoms will go away. Get medical help right away. Call your local emergency services (911 in the [place] Do not drive yourself to the hospital.   This information is not intended to replace advice given to you by your health care provider. Make sure you discuss any questions you have with your health care provider.   Document Released: 02/12/2009 Document Revised: 11/15/2014 Document Reviewed: 06/21/2014 Elsevier Interactive Patient Education 2016 [employer] Cast or Splint Care Casts and splints support injured limbs and keep bones from moving while they heal. It is important to care for your cast or splint at home.  HOME CARE INSTRUCTIONS  Keep the cast or splint uncovered during the drying period. It can take 24 to 48 hours to dry if it is made of plaster. A fiberglass cast will dry in less than 1 hour.  Do not rest the cast on anything harder than a pillow for the first 24 hours.  Do not put weight on your injured limb or apply pressure to the cast until your health  care provider gives you permission.  Keep the cast or splint dry. Wet casts or splints can lose their shape and may not support the limb as well. A wet cast that has lost its shape can also create harmful pressure on your skin when it dries. Also, wet skin can become infected.  Cover the cast or splint with a plastic bag when bathing or when out in the rain or snow. If the cast is on the trunk of the body, take sponge baths until the cast is removed.  If your cast does become wet, dry it with a towel or a blow dryer on the cool setting only.  Keep your cast or splint clean. Soiled casts may be wiped with a moistened cloth.  Do not place any hard or soft foreign objects under your cast or splint, such as cotton, toilet paper, lotion, or powder.  Do not try to scratch the skin under the cast with any object.   The object could get stuck inside the cast. Also, scratching could lead to an infection. If itching is a problem, use a blow dryer on a cool setting to relieve discomfort.  Do not trim or cut your cast or remove padding from inside of it.  Exercise all joints next to the injury that are not immobilized by the cast or splint. For example, if you have a long leg cast, exercise the hip joint and toes. If you have an arm cast or splint, exercise the shoulder, elbow, thumb, and fingers.  Elevate your injured arm or leg on 1 or 2 pillows for the first 1 to 3 days to decrease swelling and pain.It is best if you can comfortably elevate your cast so it is higher than your heart. SEEK MEDICAL CARE IF:   Your cast or splint cracks.  Your cast or splint is too tight or too loose.  You have unbearable itching inside the cast.  Your cast becomes wet or develops a soft spot or area.  You have a bad smell coming from inside your cast.  You get an object stuck under your cast.  Your skin around the cast becomes red or raw.  You have new pain or worsening pain after the cast has been  applied. SEEK IMMEDIATE MEDICAL CARE IF:   You have fluid leaking through the cast.  You are unable to move your fingers or toes.  You have discolored (blue or white), cool, painful, or very swollen fingers or toes beyond the cast.  You have tingling or numbness around the injured area.  You have severe pain or pressure under the cast.  You have any difficulty with your breathing or have shortness of breath.  You have chest pain.   This information is not intended to replace advice given to you by your health care provider. Make sure you discuss any questions you have with your health care provider.   Document Released: 02/22/2000 Document Revised: 12/15/2012 Document Reviewed: 09/02/2012 Elsevier Interactive Patient Education 2016 [employer] Inc[employer]  

## 2015-04-09 NOTE — Progress Notes (Signed)
Patient ID: Madeline Larsen, female   DOB: April 20, 1985, 30 y.o.   MRN: [id number]  Subjective: Madeline Larsen is a 30 y.o. is seen today in office s/p left CBWO and 2nd metatarsal osteotomy preformed on 04/04/15. They state their pain is controlled and she states it is "not as painful as I thought it was going to be". She's been taking the Phenergan with pain medicine. She's been an antibiotic as well. She's tried a separate foot as much as possible. Denies any systemic complaints such as fevers, chills, nausea, vomiting. No calf pain, chest pain, shortness of breath.   Objective: General: No acute distress, AAOx3  Cast is intact with minimal wear to the bottom. The calf is doing well and has not rubbing or causing any irritation. There is motor function intact all the digits. There is immediate Refill time to the digits. There is no pain with calf compression, swelling, warmth, erythema.  Assessment and Plan:  Status post left foot surgery, doing well with no complications   -Treatment options discussed including all alternatives, risks, and complications -X-rays were obtained and reviewed with the patient. Hardware intact. No evidence of acute fracture or stress fracture. -Continue nonweightbearing. -Ice/elevation -Pain medication as needed. -Monitor for any clinical signs or symptoms of infection and DVT/PE and directed to call the office immediately should any occur or go to the ER. -Follow-up in 1 week for cast change and possible suture removal or sooner if any problems arise. In the meantime, encouraged to call the office with any questions, concerns, change in symptoms.   Ovid Curd, DPM

## 2015-04-12 ENCOUNTER — Telehealth: Payer: Self-pay | Admitting: *Deleted

## 2015-04-12 ENCOUNTER — Ambulatory Visit (INDEPENDENT_AMBULATORY_CARE_PROVIDER_SITE_OTHER): Payer: BLUE CROSS/BLUE SHIELD

## 2015-04-12 DIAGNOSIS — M2010 Hallux valgus (acquired), unspecified foot: Secondary | ICD-10-CM

## 2015-04-12 DIAGNOSIS — Z9889 Other specified postprocedural states: Secondary | ICD-10-CM

## 2015-04-12 DIAGNOSIS — M216X9 Other acquired deformities of unspecified foot: Secondary | ICD-10-CM

## 2015-04-12 NOTE — Progress Notes (Signed)
Patient ID: Madeline Larsen, female   DOB: [birth year], 30 y.o.   MRN: [id number] Pt presents stating that her cast is loose and she can feel her foot shifting around. She also states that her foot hurts and there is a spot that is rubbing and causing pain on the plantar forefoot. All other systems negative. Cast removed, dressing removed noted moderate amount of dried blood. Incision site was free from s/s of infection. Sutures remained intact. Wound edges aligned and approximated. Noted mild amount of swelling to area, but pain and soreness was WNL. Sterile dressing applied, and cast re-applied via Dr Al Corpus. Pt was afebrile and denied any fever, chills, and nausea. She is to keep her follow up appt with Dr Ardelle Anton, she is to come in sooner if any acute changes occur

## 2015-04-12 NOTE — Telephone Encounter (Signed)
Needs to come in for cast change.

## 2015-04-12 NOTE — Telephone Encounter (Addendum)
Pt states her cast is too loose and rubs the incision site.  I told pt she may be up walking too much, pt denies any other walking then to the bathroom.  I will inform Dr. Ardelle Anton and call again with instructions.  INFORMED PT Dr. Ardelle Anton wanted her to come in for a cast change and recommended today if possible.  I told pt we would have someone to cast her today around 300pm, pt states she'll try to get someone to bring her today.  I told pt if that did not work for her to call the appt line and set appt to come in tomorrow for the change.  Pt states understanding.

## 2015-04-13 ENCOUNTER — Encounter: Payer: BLUE CROSS/BLUE SHIELD | Admitting: Podiatry

## 2015-04-16 ENCOUNTER — Encounter: Payer: BLUE CROSS/BLUE SHIELD | Admitting: Podiatry

## 2015-04-16 ENCOUNTER — Ambulatory Visit (INDEPENDENT_AMBULATORY_CARE_PROVIDER_SITE_OTHER): Payer: BLUE CROSS/BLUE SHIELD | Admitting: Podiatry

## 2015-04-16 ENCOUNTER — Encounter: Payer: Self-pay | Admitting: Podiatry

## 2015-04-16 DIAGNOSIS — M2010 Hallux valgus (acquired), unspecified foot: Secondary | ICD-10-CM | POA: Diagnosis not present

## 2015-04-16 DIAGNOSIS — M216X9 Other acquired deformities of unspecified foot: Secondary | ICD-10-CM | POA: Diagnosis not present

## 2015-04-16 DIAGNOSIS — Z9889 Other specified postprocedural states: Secondary | ICD-10-CM

## 2015-04-16 MED ORDER — OXYCODONE-ACETAMINOPHEN 5-325 MG PO TABS: 1.0000 | ORAL_TABLET | Freq: Three times a day (TID) | ORAL | Status: DC | PRN

## 2015-04-17 NOTE — Progress Notes (Signed)
Patient ID: Madeline Larsen, female   DOB: [birth year], 30 y.o.   MRN: [id number]  Subjective: Madeline Larsen is a 30 y.o. is seen today in office s/p left CBWO and 2nd metatarsal osteotomy preformed on 04/04/15. She states that the cast is too tight and she feels it is cutting off  Circulation to the toes management increase in swelling. She's been taking the pain medicine as needed.  Denies any systemic complaints such as fevers, chills, nausea, vomiting. No calf pain, chest pain, shortness of breath.   Objective: General: No acute distress, AAOx3  Cast is intact to the left foot does appear to be somewhat tight around the foot. Upon removal the DP/PT pulses palpable in his immediate Results on all the digits. Motor function  Intact the toes. There does appear to be subjective increase in swelling and is a moderate amount of swelling along the bunion , first interspace site. There is no erythema or increased warmth. Incision is well coapted without any evidence of dehiscence and sutures are intact. There is no drainage or pus or other clinical signs of infection. There is no other open lesions or pre-ulcerative lesions. There is no pain with calf compression, swelling, warmth, erythema.  Assessment and Plan:  Status post left foot surgery, with increased pain and tight cast  -Treatment options discussed including all alternatives, risks, and complications -Antibiotic ointment was placed over the incisions followed by dry sterile dressing. A compression wrap was applied to help control swelling. Given her swelling will place into a cam boot today. Discussed the restrictions nonweightbearing and not to remove the boot.  -Ice/elevation -Pain medication as needed. -ASA daily -Monitor for any clinical signs or symptoms of infection and DVT/PE and directed to call the office immediately should any occur or go to the ER. -Follow-up in 1 week for possible suture removal or sooner if any problems arise. In  the meantime, encouraged to call the office with any questions, concerns, change in symptoms.   Ovid Curd, DPM

## 2015-04-27 ENCOUNTER — Ambulatory Visit (INDEPENDENT_AMBULATORY_CARE_PROVIDER_SITE_OTHER): Payer: BLUE CROSS/BLUE SHIELD | Admitting: Podiatry

## 2015-04-27 ENCOUNTER — Encounter: Payer: Self-pay | Admitting: Podiatry

## 2015-04-27 ENCOUNTER — Ambulatory Visit (INDEPENDENT_AMBULATORY_CARE_PROVIDER_SITE_OTHER): Payer: BLUE CROSS/BLUE SHIELD

## 2015-04-27 VITALS — BP 147/83 | HR 73 | Resp 18

## 2015-04-27 DIAGNOSIS — M2010 Hallux valgus (acquired), unspecified foot: Secondary | ICD-10-CM | POA: Diagnosis not present

## 2015-04-27 DIAGNOSIS — Z9889 Other specified postprocedural states: Secondary | ICD-10-CM

## 2015-04-27 DIAGNOSIS — M216X9 Other acquired deformities of unspecified foot: Secondary | ICD-10-CM

## 2015-04-27 MED ORDER — OXYCODONE-ACETAMINOPHEN 5-325 MG PO TABS: 1.0000 | ORAL_TABLET | Freq: Three times a day (TID) | ORAL | Status: DC | PRN

## 2015-04-30 NOTE — Progress Notes (Signed)
Patient ID: Madeline Larsen, female   DOB: 1985-11-14, 30 y.o.   MRN: [id number]  Subjective: Madeline Larsen is a 30 y.o. is seen today in office s/p left CBWO and 2nd metatarsal osteotomy preformed on 04/04/15. She feels that she is doing much better compared to last appointment and the pain is greatly improved. She takes pain medication at night if needed but she is decreasing [name] she has been taking. Denies any systemic complaints such as fevers, chills, nausea, vomiting. No calf pain, chest pain, shortness of breath.   Objective: General: No acute distress, AAOx3  Presenting in CAM boot On the left foot incisions are well coapted without any evidence of dehiscence 2. There is no swelling erythema, ascending saline, fluctuance, crepitus, malodor or drainage. There does appear to be moderate edema along the forefoot however this does appear to be improved compared to last appointment. There is no overlying erythema or increase in warmth. Mild tenderness to palpation along the surgical site however improved range of motion of the first MTPJ. No open lesions or pre-ulcerative lesions. There is no pain with calf compression, swelling, warmth, erythema.  Assessment and Plan:  Status post left foot surgery, with increased pain and tight cast  -Treatment options discussed including all alternatives, risks, and complications -X-rays were obtained and reviewed with the patient.  -Antibiotic ointment was placed followed by a compression wrap. She can shower however recommend continued compression overlying the area to help with swelling. -Ice/elevation -Pain medication as needed. Refilled today -ASA daily -Continue nonweightbearing for 2 more weeks. -Monitor for any clinical signs or symptoms of infection and DVT/PE and directed to call the office immediately should any occur or go to the ER. -Follow-up in as scheduled or sooner if any problems arise. In the meantime, encouraged to call the office  with any questions, concerns, change in symptoms.  -If swelling persists next appointment we'll likely place in a boot  Ovid Curd, DPM

## 2015-05-18 ENCOUNTER — Ambulatory Visit (INDEPENDENT_AMBULATORY_CARE_PROVIDER_SITE_OTHER): Payer: BLUE CROSS/BLUE SHIELD | Admitting: Podiatry

## 2015-05-18 ENCOUNTER — Encounter: Payer: Self-pay | Admitting: Podiatry

## 2015-05-18 ENCOUNTER — Ambulatory Visit (INDEPENDENT_AMBULATORY_CARE_PROVIDER_SITE_OTHER): Payer: BLUE CROSS/BLUE SHIELD

## 2015-05-18 VITALS — BP 125/72 | HR 68 | Resp 18

## 2015-05-18 DIAGNOSIS — M216X9 Other acquired deformities of unspecified foot: Secondary | ICD-10-CM

## 2015-05-18 DIAGNOSIS — R609 Edema, unspecified: Secondary | ICD-10-CM | POA: Diagnosis not present

## 2015-05-18 DIAGNOSIS — Z9889 Other specified postprocedural states: Secondary | ICD-10-CM | POA: Diagnosis not present

## 2015-05-18 DIAGNOSIS — M2010 Hallux valgus (acquired), unspecified foot: Secondary | ICD-10-CM | POA: Diagnosis not present

## 2015-05-18 MED ORDER — OXYCODONE-ACETAMINOPHEN 5-325 MG PO TABS: 1.0000 | ORAL_TABLET | Freq: Three times a day (TID) | ORAL | Status: DC | PRN

## 2015-05-19 NOTE — Progress Notes (Signed)
Patient ID: Madeline Larsen, female   DOB: [birth year], 30 y.o.   MRN: [id number]  Subjective: Madeline Larsen is a 30 y.o. is seen today in office s/p left CBWO and 2nd metatarsal osteotomy preformed on 04/04/15. She states that she was doing better and the swelling had decreased quite a bit in her pain has been controlled however she did stumble a couple days ago pins away to her foot and since that she's had some increase in swelling and pain. Denies any redness or warmth. She has gone back to taking some pain medicine since the fall. Denies any systemic complaints such as fevers, chills, nausea, vomiting. No calf pain, chest pain, shortness of breath.   Objective: General: No acute distress, AAOx3  Presenting in CAM boot On the left foot incisions are well coapted without any evidence of dehiscence 2. There is no surrounding erythema, ascending cellulitis , fluctuance, crepitus, malodor or drainage. There is moderate edema around the foot however does appear to be still decreased compared to last appointment but still does remain some swollen. There is no erythema or increase in warmth over the area. There is no fluctuance or crepitus. Mild tenderness to palpation along the surgical site but improving. There is improved range of motion of the first MTPJ. No open lesions or pre-ulcerative lesions. There is no pain with calf compression, swelling, warmth, erythema.  Assessment and Plan:  Status post left foot surgery  -Treatment options discussed including all alternatives, risks, and complications -X-rays were obtained and reviewed with the patient. Side. Any acute fracture stress fracture. Hardware intact. -Unna boot applied today. She can remove this in 5 days or sooner if there is new problems. Discussed symptoms look for with complications of this cast. -Once the unna boot is removed she can start to our she'll weight-bear in the cam boot. couple weeks she can start to transition to  weightbearing as tolerated. There is any increase in pain returned and nonweightbearing. -Monitor for any clinical signs or symptoms of infection and DVT/PE and directed to call the office immediately should any occur or go to the ER. -Follow-up in as scheduled or sooner if any problems arise. In the meantime, encouraged to call the office with any questions, concerns, change in symptoms.    Ovid CurdMatthew Wagoner, DPM

## 2015-06-08 ENCOUNTER — Ambulatory Visit (INDEPENDENT_AMBULATORY_CARE_PROVIDER_SITE_OTHER): Payer: BLUE CROSS/BLUE SHIELD

## 2015-06-08 ENCOUNTER — Ambulatory Visit (INDEPENDENT_AMBULATORY_CARE_PROVIDER_SITE_OTHER): Payer: BLUE CROSS/BLUE SHIELD | Admitting: Podiatry

## 2015-06-08 ENCOUNTER — Encounter: Payer: Self-pay | Admitting: Podiatry

## 2015-06-08 VITALS — BP 145/86 | HR 75 | Resp 18

## 2015-06-08 DIAGNOSIS — R609 Edema, unspecified: Secondary | ICD-10-CM | POA: Diagnosis not present

## 2015-06-08 DIAGNOSIS — M2010 Hallux valgus (acquired), unspecified foot: Secondary | ICD-10-CM

## 2015-06-08 DIAGNOSIS — Z9889 Other specified postprocedural states: Secondary | ICD-10-CM

## 2015-06-08 MED ORDER — MUPIROCIN 2 % EX OINT: 1.0000 "application " | TOPICAL_OINTMENT | Freq: Two times a day (BID) | CUTANEOUS | Status: DC

## 2015-06-08 NOTE — Patient Instructions (Signed)

## 2015-06-08 NOTE — Progress Notes (Signed)
Patient ID: Madeline Larsen, female   DOB: 07/09/1985, 30 y.o.   MRN: [id number]   Subjective: Madeline Fridgengel L Larsen is a 30 y.o. is seen today in office s/p left CBWO and 2nd metatarsal osteotomy preformed on 04/04/15. Since last appointment she has been ambulating in a cam boot weightbearing as tolerated without any pain. She states that her fetus redness smell and sweat quite a bit. She has noticed blisters from the bottom of the foot as well away from the surgical site. Denies any drainage or redness. She states that ifshe pops them they become painful. Denies any pus. She states that the swelling has greatly improved. Denies any systemic complaints such as fevers, chills, nausea, vomiting. No calf pain, chest pain, shortness of breath.   Objective: General: No acute distress, AAOx3  Presenting in CAM boot On the left foot incisions are well coapted without any evidence of dehiscence 2. There is no surrounding erythema, ascending cellulitis , fluctuance, crepitus, malodor or drainage. There is significantly improved edema to the surgical site. There is no pain along the first metatarsals of the first MTPJ. There is some mild discomfort of the second metatarsal on the surgical site however this is improving. First MTPJ range of motion is intact without any pain or restrictions. On the plantar aspect of the arch of the foot there appears to be some areas of hyperkeratotic tissue from previous blisters of dried as well as 1 very small pinpoint type blister present at this time. Is no drainage or pus. No surrounding redness or swelling. No other open lesions or pre-ulcerative lesions. There is no pain with calf compression, swelling, warmth, erythema.  Assessment and Plan:  Status post left foot surgery; blistering which may result of infection/fungal given that she has had excessive sweating and smell to her feet.  -Treatment options discussed including all alternatives, risks, and complications -X-rays were  obtained and reviewed with the patient. Side. Any acute fracture stress fracture. Hardware intact.Elevation first metatarsal. I discussed this with her today. -She started transition to a surgical shoe which was dispensed today. -Prescribed mupirocin ointment for the blistering as well as Epson salt soaks. Monitor for signs or symptoms of infection to call the office should any occur. -At next couple weeks she can start to transition to regular shoe as tolerated. -Follow-up in 3 weeks or sooner if any issues arise.   Ovid CurdMatthew Wagoner, DPM

## 2015-07-02 ENCOUNTER — Encounter: Payer: BLUE CROSS/BLUE SHIELD | Admitting: Podiatry

## 2016-09-18 ENCOUNTER — Ambulatory Visit (INDEPENDENT_AMBULATORY_CARE_PROVIDER_SITE_OTHER): Payer: No Typology Code available for payment source

## 2016-09-18 ENCOUNTER — Ambulatory Visit (HOSPITAL_COMMUNITY)
Admission: EM | Admit: 2016-09-18 | Discharge: 2016-09-18 | Disposition: A | Discharge status: Home / Self Care | Payer: No Typology Code available for payment source | Attending: Emergency Medicine | Admitting: Emergency Medicine

## 2016-09-18 ENCOUNTER — Encounter (HOSPITAL_COMMUNITY): Payer: Self-pay | Admitting: Emergency Medicine

## 2016-09-18 DIAGNOSIS — R197 Diarrhea, unspecified: Secondary | ICD-10-CM | POA: Diagnosis not present

## 2016-09-18 DIAGNOSIS — R809 Proteinuria, unspecified: Secondary | ICD-10-CM | POA: Insufficient documentation

## 2016-09-18 DIAGNOSIS — H9201 Otalgia, right ear: Secondary | ICD-10-CM | POA: Diagnosis not present

## 2016-09-18 DIAGNOSIS — R0789 Other chest pain: Secondary | ICD-10-CM | POA: Diagnosis not present

## 2016-09-18 DIAGNOSIS — R509 Fever, unspecified: Secondary | ICD-10-CM | POA: Diagnosis not present

## 2016-09-18 DIAGNOSIS — R52 Pain, unspecified: Secondary | ICD-10-CM | POA: Insufficient documentation

## 2016-09-18 DIAGNOSIS — R112 Nausea with vomiting, unspecified: Secondary | ICD-10-CM | POA: Diagnosis not present

## 2016-09-18 DIAGNOSIS — R0602 Shortness of breath: Secondary | ICD-10-CM | POA: Diagnosis not present

## 2016-09-18 DIAGNOSIS — M791 Myalgia: Secondary | ICD-10-CM

## 2016-09-18 DIAGNOSIS — Z79899 Other long term (current) drug therapy: Secondary | ICD-10-CM | POA: Diagnosis not present

## 2016-09-18 DIAGNOSIS — R110 Nausea: Secondary | ICD-10-CM

## 2016-09-18 DIAGNOSIS — R51 Headache: Secondary | ICD-10-CM | POA: Diagnosis not present

## 2016-09-18 LAB — POCT URINALYSIS DIP (DEVICE)
GLUCOSE, UA: NEGATIVE mg/dL
Hgb urine dipstick: NEGATIVE
Leukocytes, UA: NEGATIVE
NITRITE: NEGATIVE
PH: 6 (ref 5.0–8.0)
PROTEIN: 30 mg/dL — AB
Specific Gravity, Urine: 1.025 (ref 1.005–1.030)
Urobilinogen, UA: 1 mg/dL (ref 0.0–1.0)

## 2016-09-18 LAB — POCT I-STAT, CHEM 8
BUN: 7 mg/dL (ref 6–20)
CREATININE: 1 mg/dL (ref 0.44–1.00)
Calcium, Ion: 1.18 mmol/L (ref 1.15–1.40)
Chloride: 101 mmol/L (ref 101–111)
GLUCOSE: 91 mg/dL (ref 65–99)
HEMATOCRIT: 46 % (ref 36.0–46.0)
HEMOGLOBIN: 15.6 g/dL — AB (ref 12.0–15.0)
Potassium: 4.2 mmol/L (ref 3.5–5.1)
Sodium: 138 mmol/L (ref 135–145)
TCO2: 27 mmol/L (ref 0–100)

## 2016-09-18 MED ORDER — ONDANSETRON 8 MG PO TBDP: 8.0000 mg | ORAL_TABLET | Freq: Three times a day (TID) | ORAL | 0 refills | Status: DC | PRN

## 2016-09-18 MED ORDER — ACETAMINOPHEN 325 MG PO TABS
ORAL_TABLET | ORAL | Status: AC
  Filled 2016-09-18: qty 3

## 2016-09-18 MED ORDER — DOXYCYCLINE HYCLATE 100 MG PO CAPS: 100.0000 mg | ORAL_CAPSULE | Freq: Two times a day (BID) | ORAL | 0 refills | Status: AC

## 2016-09-18 MED ORDER — ACETAMINOPHEN 325 MG PO TABS
975.0000 mg | ORAL_TABLET | Freq: Once | ORAL | Status: AC
  Administered 2016-09-18: 975 mg via ORAL

## 2016-09-18 NOTE — ED Provider Notes (Signed)
HPI  SUBJECTIVE:  Madeline Larsen is a 31 y.o. female who presents with body aches, fevers Tmax 100, chills for the past 6 days. He reports right ear pain but denies change in hearing or otorrhea. He reports sharp intermittent substernal chest pain that lasts for seconds and states that his back feels tight when he gets this. He reports shortness of breath described as can't get enough air in. He reports a frontal right-sided headache for the past several days but is not the first or worst headache of his life. He denies change in physical activity, change in color of his urine. No nasal congestion, sinus pain or pressure, postnasal drip. No coughing, wheezing, dyspnea on exertion. No sore throat. No photophobia, neck stiffness, rash. No tick bite. He does not go out in the woods. No abdominal pain. Reports nausea during this time and 1 episode of emesis this morning. Also reports loose stools this morning. Reports decreased appetite but is tolerating by mouth since of vomiting earlier today. No urinary urgency, frequency, cloudy or odorous urine, hematuria. No sores, boils, sick contacts. He has been taking TheraFlu, ibuprofen 800 mg, DayQuil, NyQuil without improvement in his symptoms. Last his ibuprofen was 6 hours ago. There are no aggravating or alleviating factors. No surgery in the past 4 weeks, calf swelling, hemoptysis, immobilization, prolonged trip, exogenous estrogen. Patient is transgendered. No history of diabetes, hypertension, HIV, immunocompromise, cancer, steroid use, asthma, emphysema, COPD, smoking, pneumonia, PE, DVT, UTI, pyelonephritis. No history of hypercholesterolemia, not on any statins. Family history significant for cancer. LMP: Amenorrheic. PMD: None.   Past Medical History:  Diagnosis Date  . MRSA (methicillin resistant staph aureus) culture positive     History reviewed. No pertinent surgical history.  History reviewed. No pertinent family history.  Social History   Substance Use Topics  . Smoking status: Never Smoker  . Smokeless tobacco: Never Used  . Alcohol use No    No current facility-administered medications for this encounter.   Current Outpatient Prescriptions:  .  ibuprofen (ADVIL,MOTRIN) 200 MG tablet, Take 800 mg by mouth every 6 (six) hours as needed for moderate pain., Disp: , Rfl:  .  testosterone cypionate (DEPOTESTOTERONE CYPIONATE) 100 MG/ML injection, Inject 100 mg into the muscle every 14 (fourteen) days. For IM use only, Disp: , Rfl:  .  doxycycline (VIBRAMYCIN) 100 MG capsule, Take 1 capsule (100 mg total) by mouth 2 (two) times daily., Disp: 20 capsule, Rfl: 0 .  ondansetron (ZOFRAN ODT) 8 MG disintegrating tablet, Take 1 tablet (8 mg total) by mouth every 8 (eight) hours as needed for nausea or vomiting., Disp: 20 tablet, Rfl: 0  No Known Allergies   ROS  As noted in HPI.   Physical Exam  BP 136/88 (BP Location: Right Arm)   Pulse 71   Temp 100 F (37.8 C) (Oral)   Resp 18   SpO2 100%   Constitutional: Well developed, well nourished, no acute distress. Diaphoretic. Eyes: PERRL, EOMI, conjunctiva normal bilaterally no photophobia HENT: Normocephalic, atraumatic,mucus membranes mois.t TMs normal bilaterally. No nasal congestion. No sinus tenderness. Oropharynx normal. Neck: No cervical lymphadenopathy, meningismus Respiratory: Clear to auscultation bilaterally, no rales, no wheezing, no rhonchi Cardiovascular: Normal rate and rhythm, no murmurs, no gallops, no rubs GI: Soft, nondistended, normal bowel sounds, mild left flank/ lower quadrant tenderness, no rebound, no guarding. negative Murphy, negative McBurney. No suprapubic tenderness. Back: no CVAT skin: No rash, skin intact Musculoskeletal: No edema, no tenderness, no deformities Neurologic: Alert &  oriented x 3, CN II-XII grossly intact, no motor deficits, sensation grossly intact Psychiatric: Speech and behavior appropriate   ED Course   Medications   acetaminophen (TYLENOL) tablet 975 mg (975 mg Oral Given 09/18/16 1747)    Orders Placed This Encounter  Procedures  . DG Chest 2 View    Standing Status:   Standing    Number of Occurrences:   1    Order Specific Question:   Reason for Exam (SYMPTOM  OR DIAGNOSIS REQUIRED)    Answer:   Bodyaches, fever, shortness of breath r/o PNA, effusion, pulmonary edema  . Rocky mtn spotted fvr abs pnl(IgG+IgM)    Standing Status:   Standing    Number of Occurrences:   1  . B. burgdorfi antibodies    Standing Status:   Standing    Number of Occurrences:   1  . I-STAT, chem 8    Standing Status:   Standing    Number of Occurrences:   1  . POCT urinalysis dip (device)    Standing Status:   Standing    Number of Occurrences:   1   Results for orders placed or performed during the hospital encounter of 09/18/16 (from the past 24 hour(s))  I-STAT, chem 8     Status: Abnormal   Collection Time: 09/18/16  5:58 PM  Result Value Ref Range   Sodium 138 135 - 145 mmol/L   Potassium 4.2 3.5 - 5.1 mmol/L   Chloride 101 101 - 111 mmol/L   BUN 7 6 - 20 mg/dL   Creatinine, Ser 1.61 0.44 - 1.00 mg/dL   Glucose, Bld 91 65 - 99 mg/dL   Calcium, Ion 0.96 0.45 - 1.40 mmol/L   TCO2 27 0 - 100 mmol/L   Hemoglobin 15.6 (H) 12.0 - 15.0 g/dL   HCT 40.9 81.1 - 91.4 %  POCT urinalysis dip (device)     Status: Abnormal   Collection Time: 09/18/16  6:00 PM  Result Value Ref Range   Glucose, UA NEGATIVE NEGATIVE mg/dL   Bilirubin Urine SMALL (A) NEGATIVE   Ketones, ur TRACE (A) NEGATIVE mg/dL   Specific Gravity, Urine 1.025 1.005 - 1.030   Hgb urine dipstick NEGATIVE NEGATIVE   pH 6.0 5.0 - 8.0   Protein, ur 30 (A) NEGATIVE mg/dL   Urobilinogen, UA 1.0 0.0 - 1.0 mg/dL   Nitrite NEGATIVE NEGATIVE   Leukocytes, UA NEGATIVE NEGATIVE   Dg Chest 2 View  Result Date: 09/18/2016 CLINICAL DATA:  Sick since last Friday, fatigue and chest tightness with fever EXAM: CHEST  2 VIEW COMPARISON:  08/30/2013 FINDINGS:  The heart size and mediastinal contours are within normal limits. Both lungs are clear. The visualized skeletal structures demonstrate minimal degenerative changes of the spine. IMPRESSION: No active cardiopulmonary disease. Electronically Signed   By: Jasmine Pang M.D.   On: 09/18/2016 18:28    ED Clinical Impression  Body aches  Fever, unspecified   ED Assessment/Plan  Giving 975 mg Tylenol. We'll check chest x-ray, i-STAT, UA to rule out pneumonia, kidney damage, UTI. No evidence of pyelonephritis. Doubt rhabdomyolysis. Patient does have some tenderness in left lower quadrant but feel that this is from the vomiting and diarrhea this morning. He had negative McBurney, negative Murphy. No evidence of surgical abdomen. No evidence of otitis, meningitis, sinusitis, pharyngitis. Concern for tick borne disease, so we'll check Rocky spotted fever and Lyme titers.  UA negative for UTI. He has proteinuria, trace ketones and small bilirubin.  His i-STAT is unremarkable.   Pt PERC neg. Doubt PE or cardiac cause of his symptoms.  plan to send home with doxycycline for 10 days for empiric treatment of tickborne illness, Zofran, push fluids. Patient states that he does not need a prescription of ibuprofen. Gave patient strict ER return precautions, he is to go to the ER if not improving after 48 hours. Providing primary care referral for routine care.  Discussed labs, imaging, MDM, plan and followup with patient. Discussed sn/sx that should prompt return to the ED. Patient agrees with plan.   Meds ordered this encounter  Medications  . acetaminophen (TYLENOL) tablet 975 mg  . doxycycline (VIBRAMYCIN) 100 MG capsule    Sig: Take 1 capsule (100 mg total) by mouth 2 (two) times daily.    Dispense:  20 capsule    Refill:  0  . ondansetron (ZOFRAN ODT) 8 MG disintegrating tablet    Sig: Take 1 tablet (8 mg total) by mouth every 8 (eight) hours as needed for nausea or vomiting.    Dispense:  20 tablet     Refill:  0    *This clinic note was created using Scientist, clinical (histocompatibility and immunogenetics)Dragon dictation software. Therefore, there may be occasional mistakes despite careful proofreading.  ?   Domenick GongMortenson, Nylee Barbuto, MD 09/18/16 [phone]

## 2016-09-18 NOTE — Discharge Instructions (Signed)
Continue 600 mg of ibuprofen with 1 g of Tylenol 3-4 times a day. This is an effective combination for fever and pain. Make sure you push plenty of extra fluids. Finish the doxycycline, even if you feel better. Give us a working phone number so that we can contact you if we need to change your management. Go immediately to the ER for any of the signs and symptoms we discussed or if you're not getting better after being on the doxycycline for 48 hours. You'll need to have your urine rechecked when you're feeling better. You have some protein in it today.   Below is a list of primary care practices who are taking new patients for you to follow-up with. [facility] [address] [place], [place] [address] [phone]  [facility] [phone] [address] [place] [address]  [facility]: [address] [place] [address]  [phone]  [facility]: [address] [place] [id number]   [phone]  [facility]: [address] [place] [address]  [phone]  [facility] : [address] [place] [address] [phone]  [facility]: [address] [place] [phone]  [facility]: [address] [place] [phone]  Dr. Oneal GroutMahima Pandey [address] [facility] [facility] [place] [address]  [phone]  Dr. Jackie PlumGeorge Osei-Bonsu, [facility]. [address] [place], [place] [address]  [phone]  Go to www.goodrx.com to look up your medications. This will give you a list of where you can find your prescriptions at the most affordable prices. Or ask the pharmacist what the cash price is. This can be less expensive than  what you would pay with insurance.

## 2016-09-18 NOTE — ED Triage Notes (Signed)
Here for generalized body aches onset 7 days associated w/fevers, CP, chills  Denies cold sx  A&O x4... NAD... Ambulatory

## 2016-09-19 LAB — B. BURGDORFI ANTIBODIES: B burgdorferi Ab IgG+IgM: <0.91 {ISR} (ref 0.00–0.90)

## 2016-09-19 LAB — ROCKY MTN SPOTTED FVR ABS PNL(IGG+IGM)
RMSF IgG: NEGATIVE
RMSF IgM: 0.23 index (ref 0.00–0.89)

## 2016-09-25 ENCOUNTER — Ambulatory Visit (INDEPENDENT_AMBULATORY_CARE_PROVIDER_SITE_OTHER): Payer: No Typology Code available for payment source

## 2016-09-25 ENCOUNTER — Encounter: Payer: Self-pay | Admitting: Podiatry

## 2016-09-25 ENCOUNTER — Ambulatory Visit (INDEPENDENT_AMBULATORY_CARE_PROVIDER_SITE_OTHER): Payer: No Typology Code available for payment source | Admitting: Podiatry

## 2016-09-25 VITALS — BP 121/72 | HR 63

## 2016-09-25 DIAGNOSIS — Z79899 Other long term (current) drug therapy: Secondary | ICD-10-CM | POA: Diagnosis not present

## 2016-09-25 DIAGNOSIS — M205X2 Other deformities of toe(s) (acquired), left foot: Secondary | ICD-10-CM | POA: Diagnosis not present

## 2016-09-25 DIAGNOSIS — M722 Plantar fascial fibromatosis: Secondary | ICD-10-CM | POA: Diagnosis not present

## 2016-09-25 DIAGNOSIS — B351 Tinea unguium: Secondary | ICD-10-CM | POA: Diagnosis not present

## 2016-09-25 LAB — CBC WITH DIFFERENTIAL/PLATELET
BASOS ABS: 90 {cells}/uL (ref 0–200)
Basophils Relative: 3 %
EOS ABS: 30 {cells}/uL (ref 15–500)
Eosinophils Relative: 1 %
HEMATOCRIT: 42.7 % (ref 35.0–45.0)
HEMOGLOBIN: 13.9 g/dL (ref 11.7–15.5)
Lymphocytes Relative: 62 %
Lymphs Abs: 1860 cells/uL (ref 850–3900)
MCH: 28.4 pg (ref 27.0–33.0)
MCHC: 32.6 g/dL (ref 32.0–36.0)
MCV: 87.3 fL (ref 80.0–100.0)
MONO ABS: 300 {cells}/uL (ref 200–950)
MONOS PCT: 10 %
MPV: 9.8 fL (ref 7.5–12.5)
NEUTROS ABS: 720 {cells}/uL — AB (ref 1500–7800)
Neutrophils Relative %: 24 %
PLATELETS: 275 10*3/uL (ref 140–400)
RBC: 4.89 MIL/uL (ref 3.80–5.10)
RDW: 13.2 % (ref 11.0–15.0)
WBC: 3 10*3/uL — ABNORMAL LOW (ref 3.8–10.8)

## 2016-09-25 LAB — HEPATIC FUNCTION PANEL
ALK PHOS: 54 U/L (ref 33–115)
ALT: 15 U/L (ref 6–29)
AST: 24 U/L (ref 10–30)
Albumin: 3.9 g/dL (ref 3.6–5.1)
BILIRUBIN INDIRECT: 0.5 mg/dL (ref 0.2–1.2)
Bilirubin, Direct: 0.1 mg/dL (ref <=0.2)
TOTAL PROTEIN: 7.2 g/dL (ref 6.1–8.1)
Total Bilirubin: 0.6 mg/dL (ref 0.2–1.2)

## 2016-09-25 NOTE — Progress Notes (Signed)
Subjective: Lodema PilotKaisin presents to the office today for concerns of her right big toenail becoming to color which does get tender at times. She says that his discomfort with shoes and socks at times. She also said that she has a callus in the left foot in the ball of the foot which does help with pressure. She states left side was doing very well up until about one week ago when she starts there is a small amount of swelling to the foot along the surgical site. Unfortunately I did not see Tija back after the last appointment. They state that they for doing very well and was having no problems of the foot. She denies any recent injury or trauma. No other complaints. Denies any systemic complaints such as fevers, chills, nausea, vomiting. No acute changes since last appointment, and no other complaints at this time.   Objective: AAO x3, NAD DP/PT pulses palpable bilaterally, CRT less than 3 seconds On the left foot is hyperkeratotic lesion submetatarsal 2. Upon debridement there is no underlying ulceration, drainage or any clinical signs of infection present. There does appear to be hallux abductus present for the prominence from the actual bunion appears to be improved compared to what it was prior to surgery and are reviewed. Toe and the position. There is minimal discomfort of the first MTPJ. There is decreased range of motion in dorsiflexion the first MPJ. There is no area pinpoint tenderness identified at this time. There is no overlying edema, erythema, increase in warmth. On the right foot the hallux appears to be dystrophic, discolored with relative brown discoloration. There is no surrounding edema, erythema, drainage or pus and there is mild hypertrophy of the nail. There is no other areas of tenderness identified the right side. There is a decrease in medial arch height upon weightbearing bilaterally. Mild discomfort on the medial band of the plantar fascia bilaterally.  No open lesions or  pre-ulcerative lesions.  No pain with calf compression, swelling, warmth, erythema  Assessment: Hyperkeratotic lesion submetatarsal to the biomechanical changes onychodystrophy right hallux   Plan: -All treatment options discussed with the patient including all alternatives, risks, complications.  -Sharply debrided the hallux toenail without any complications or bleeding. Discussed onychomycosis is likely. Discussed reaming options for this and Mychaela wishes to proceed with oral therapy. Discussed Lamisil as well as side effects and they wish to proceed. Prescription for Lamisil was given as well as checking blood work prior to starting medication. There not to start the medications I call with results of the blood work and verbally understood.  -Hyperkeratotic lesion sharply debrided 1 without complications or bleeding. -At this point I do recommend custom was orthotic. Given their flatfoot as well as spine mechanical changes up with us be beneficial for them. Molded for inserts today. -RTC 3 weeks or sooner if needed.  -Patient encouraged to call the office with any questions, concerns, change in symptoms.   Ovid CurdMatthew Pelham Hennick, DPM

## 2016-09-25 NOTE — Patient Instructions (Signed)

## 2016-09-29 ENCOUNTER — Telehealth: Payer: Self-pay | Admitting: *Deleted

## 2016-09-29 DIAGNOSIS — Z79899 Other long term (current) drug therapy: Secondary | ICD-10-CM

## 2016-09-29 NOTE — Telephone Encounter (Addendum)
-----   Message from Vivi BarrackMatthew R Wagoner, DPM sent at 09/26/2016  3:23 PM EDT ----- Please recheck the CBC. The neutrophils are low and lamsil can make them go lower. Do not start Lamisil.09/29/2016-Left message with pt's wife to call for results.09/30/2016-I informed pt of Dr. Gabriel RungWagoner's review of blood work and orders. I told pt I would order labs to be repeated around 10[facility]26/2016 at [facility] where [facility] lab [facility] performed before.

## 2016-11-06 ENCOUNTER — Ambulatory Visit: Payer: No Typology Code available for payment source | Admitting: Podiatry

## 2017-02-16 ENCOUNTER — Other Ambulatory Visit: Payer: Self-pay

## 2017-02-16 ENCOUNTER — Emergency Department (HOSPITAL_COMMUNITY): Payer: PRIVATE HEALTH INSURANCE

## 2017-02-16 ENCOUNTER — Emergency Department (HOSPITAL_COMMUNITY)
Admission: EM | Admit: 2017-02-16 | Discharge: 2017-02-16 | Disposition: A | Discharge status: Home / Self Care | Payer: PRIVATE HEALTH INSURANCE | Attending: Emergency Medicine | Admitting: Emergency Medicine

## 2017-02-16 ENCOUNTER — Encounter (HOSPITAL_COMMUNITY): Payer: Self-pay

## 2017-02-16 DIAGNOSIS — R1030 Lower abdominal pain, unspecified: Secondary | ICD-10-CM | POA: Diagnosis not present

## 2017-02-16 DIAGNOSIS — Z79899 Other long term (current) drug therapy: Secondary | ICD-10-CM | POA: Insufficient documentation

## 2017-02-16 DIAGNOSIS — R1031 Right lower quadrant pain: Secondary | ICD-10-CM | POA: Diagnosis present

## 2017-02-16 HISTORY — DX: Other specified health status: Z78.9

## 2017-02-16 HISTORY — DX: Transsexualism: F64.0

## 2017-02-16 LAB — COMPREHENSIVE METABOLIC PANEL
ALBUMIN: 3.5 g/dL (ref 3.5–5.0)
ALK PHOS: 42 U/L (ref 38–126)
ALT: 21 U/L (ref 14–54)
AST: 45 U/L — AB (ref 15–41)
Anion gap: 7 (ref 5–15)
CALCIUM: 8.9 mg/dL (ref 8.9–10.3)
CHLORIDE: 103 mmol/L (ref 101–111)
CO2: 25 mmol/L (ref 22–32)
CREATININE: 0.81 mg/dL (ref 0.44–1.00)
GFR calc Af Amer: >60 mL/min (ref >60)
GFR calc non Af Amer: >60 mL/min (ref >60)
GLUCOSE: 97 mg/dL (ref 65–99)
Potassium: 3.9 mmol/L (ref 3.5–5.1)
SODIUM: 135 mmol/L (ref 135–145)
Total Bilirubin: 0.3 mg/dL (ref 0.3–1.2)
Total Protein: 7 g/dL (ref 6.5–8.1)

## 2017-02-16 LAB — CBC
HCT: 40.8 % (ref 36.0–46.0)
Hemoglobin: 13.4 g/dL (ref 12.0–15.0)
MCH: 28.8 pg (ref 26.0–34.0)
MCHC: 32.8 g/dL (ref 30.0–36.0)
MCV: 87.7 fL (ref 78.0–100.0)
PLATELETS: 231 10*3/uL (ref 150–400)
RBC: 4.65 MIL/uL (ref 3.87–5.11)
RDW: 12.6 % (ref 11.5–15.5)
WBC: 3.6 10*3/uL — ABNORMAL LOW (ref 4.0–10.5)

## 2017-02-16 LAB — URINALYSIS, ROUTINE W REFLEX MICROSCOPIC
BILIRUBIN URINE: NEGATIVE
Glucose, UA: NEGATIVE mg/dL
HGB URINE DIPSTICK: NEGATIVE
KETONES UR: NEGATIVE mg/dL
Nitrite: NEGATIVE
PH: 7 (ref 5.0–8.0)
PROTEIN: NEGATIVE mg/dL
Specific Gravity, Urine: 1.012 (ref 1.005–1.030)

## 2017-02-16 LAB — WET PREP, GENITAL
CLUE CELLS WET PREP: NONE SEEN
Sperm: NONE SEEN
Trich, Wet Prep: NONE SEEN
Yeast Wet Prep HPF POC: NONE SEEN

## 2017-02-16 LAB — I-STAT BETA HCG BLOOD, ED (MC, WL, AP ONLY)

## 2017-02-16 LAB — LIPASE, BLOOD: LIPASE: 23 U/L (ref 11–51)

## 2017-02-16 MED ORDER — KETOROLAC TROMETHAMINE 30 MG/ML IJ SOLN
30.0000 mg | Freq: Once | INTRAMUSCULAR | Status: AC
  Administered 2017-02-16: 30 mg via INTRAVENOUS
  Filled 2017-02-16: qty 1

## 2017-02-16 MED ORDER — ONDANSETRON HCL 4 MG/2ML IJ SOLN
4.0000 mg | Freq: Once | INTRAMUSCULAR | Status: AC
  Administered 2017-02-16: 4 mg via INTRAVENOUS
  Filled 2017-02-16: qty 2

## 2017-02-16 MED ORDER — SODIUM CHLORIDE 0.9 % IV BOLUS (SEPSIS)
500.0000 mL | Freq: Once | INTRAVENOUS | Status: AC
  Administered 2017-02-16: 500 mL via INTRAVENOUS

## 2017-02-16 MED ORDER — HYDROMORPHONE HCL 1 MG/ML IJ SOLN
1.0000 mg | Freq: Once | INTRAMUSCULAR | Status: AC
  Administered 2017-02-16: 1 mg via INTRAVENOUS
  Filled 2017-02-16: qty 1

## 2017-02-16 NOTE — ED Notes (Signed)
Pt voices understanding of discharge instructions. NAD. Ambulatory. Pain 2/10 on departure.

## 2017-02-16 NOTE — ED Notes (Signed)
Pelvic cart setup at bedside. Pt informed about procedure, pt understood.

## 2017-02-16 NOTE — ED Triage Notes (Signed)
Per [facility], Pt is coming from home with complaints of RLQ pain with rebound tenderness. Pt attempted OTC medication with no relief. Radiates up the right side. Denies N/V. Vitals per EMS: 110 Palpated BP, 70 HR, 98% on RA, 18 RR.

## 2017-02-16 NOTE — ED Provider Notes (Addendum)
[facility] Provider Note   CSN: [id number] Arrival date & time: 02/16/17  [id number]     History   Chief Complaint Chief Complaint  Patient presents with  . Abdominal Pain    HPI Madeline Larsen is a 31 y.o. female.  HPI  31 year old transgender female, born female, genitals remain female but is taking testosterone weekly who presents today complaining of right lower quadrant pain that began 1 week ago.  It has radiated from the right flank into the right lower quadrant coming and going intermittently over the past week.  When it occurs it is severe.  Since yesterday it has worsened and has moved into the right lower quadrant.  He has taken Tylenol, ibuprofen, and over-the-counter Gas-X without relief.  He continues to have severe pain with associated nausea and vomiting.  Bowel move been normal.  Is not had similar pain in the past.  He denies UTI symptoms, frequency, discolored urine, hematuria, fever, or chills.  Past Medical History:  Diagnosis Date  . Female-to-female transgender person   . MRSA (methicillin resistant staph aureus) culture positive     Patient Active Problem List   Diagnosis Date Noted  . 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 Status post left foot surgery 04/09/2015  . 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 FOOT SURGERY      OB History    No data available       Home Medications    Prior to Admission medications   Medication Sig Start Date End Date Taking? Authorizing Provider  ibuprofen (ADVIL,MOTRIN) 200 MG tablet Take 800 mg by mouth every 6 (six) hours as needed for moderate pain.    [provider]  ondansetron (ZOFRAN ODT) 8 MG disintegrating tablet Take 1 tablet (8 mg total) by mouth every 8 (eight) hours as needed for nausea or vomiting. 09/18/16   Domenick GongMortenson, Ashley, MD  testosterone cypionate (DEPOTESTOTERONE CYPIONATE) 100 MG/ML injection  Inject 100 mg into the muscle every 14 (fourteen) days. For IM use only    [provider]    Family History No family history on file.  Social History Social History   Tobacco Use  . Smoking status: Never Smoker  . Smokeless tobacco: Never Used  Substance Use Topics  . Alcohol use: No    Alcohol/week: 0.0 oz  . Drug use: No     Allergies   Patient has no known allergies.   Review of Systems Review of Systems  All other systems reviewed and are negative.    Physical Exam Updated Vital Signs BP 124/81 (BP Location: Right Arm)   Pulse 69   Temp 97.8 F (36.6 C) (Oral)   Resp 18   Ht 1.575 m (5\' 2" )   Wt 99.8 kg (220 lb)   SpO2 100%   BMI 40.24 kg/m   Physical Exam  Constitutional: She appears well-developed and well-nourished.  HENT:  Head: Normocephalic and atraumatic.  Eyes: EOM are normal. Pupils are equal, round, and reactive to light.  Cardiovascular: Normal rate and regular rhythm.  Pulmonary/Chest: Effort normal and breath sounds normal.  Abdominal: Soft. Normal appearance and bowel sounds are normal. There is tenderness in the right lower quadrant.  Genitourinary: Vagina normal. Uterus is tender. Cervix exhibits friability. No foreign body in the vagina. No vaginal discharge found.  Skin: Skin is warm and dry. Capillary refill takes less than 2 seconds.  Nursing note  and vitals reviewed.    ED Treatments / Results  Labs (all labs ordered are listed, but only abnormal results are displayed) Labs Reviewed  CBC - Abnormal; Notable for the following components:      Result Value   WBC 3.6 (*)    All other components within normal limits  LIPASE, BLOOD  COMPREHENSIVE METABOLIC PANEL  URINALYSIS, ROUTINE W REFLEX MICROSCOPIC  I-STAT BETA HCG BLOOD, ED ([facility], [facility], [facility] ONLY)    EKG  EKG Interpretation None       Radiology No results found.  Procedures Procedures (including critical care time)  Medications Ordered in ED Medications  - No data to display   Initial Impression / Assessment and Plan / ED Course  I have reviewed the triage vital signs and the nursing notes.  Pertinent labs & imaging results that were available during my care of the patient were reviewed by me and considered in my medical decision making (see chart for details).    9:12 AM Patient with no abnormalities seen on ct, but continues with pain after toradol.  Dilaudid 1 mg ordered.  12:51 PM Patient continues improved.  CT and us without source of pain.   Plan f/u with pmd.  Patient advised re return precautions and need for f/u and voices understanding. Transgender female Final Clinical Impressions(s) / ED Diagnoses   Final diagnoses:  Lower abdominal pain    ED Discharge Orders    None       Margarita Grizzleay, Korbyn Vanes, MD 02/16/17 1615    Margarita Grizzleay, Kenlee Maler, MD 02/16/17 [phone]

## 2017-02-17 LAB — GC/CHLAMYDIA PROBE AMP (~~LOC~~) NOT AT ARMC
Chlamydia: NEGATIVE
NEISSERIA GONORRHEA: NEGATIVE

## 2017-04-12 ENCOUNTER — Emergency Department (HOSPITAL_COMMUNITY): Payer: No Typology Code available for payment source

## 2017-04-12 ENCOUNTER — Emergency Department (HOSPITAL_COMMUNITY)
Admission: EM | Admit: 2017-04-12 | Discharge: 2017-04-12 | Disposition: A | Discharge status: Home / Self Care | Payer: No Typology Code available for payment source | Attending: Emergency Medicine | Admitting: Emergency Medicine

## 2017-04-12 ENCOUNTER — Other Ambulatory Visit: Payer: Self-pay

## 2017-04-12 ENCOUNTER — Encounter (HOSPITAL_COMMUNITY): Payer: Self-pay | Admitting: Emergency Medicine

## 2017-04-12 DIAGNOSIS — Z79899 Other long term (current) drug therapy: Secondary | ICD-10-CM | POA: Insufficient documentation

## 2017-04-12 DIAGNOSIS — R200 Anesthesia of skin: Secondary | ICD-10-CM | POA: Diagnosis present

## 2017-04-12 DIAGNOSIS — R202 Paresthesia of skin: Secondary | ICD-10-CM

## 2017-04-12 LAB — I-STAT CHEM 8, ED
BUN: 9 mg/dL (ref 6–20)
CALCIUM ION: 1.17 mmol/L (ref 1.15–1.40)
Chloride: 103 mmol/L (ref 101–111)
Creatinine, Ser: 0.8 mg/dL (ref 0.44–1.00)
GLUCOSE: 94 mg/dL (ref 65–99)
HCT: 48 % — ABNORMAL HIGH (ref 36.0–46.0)
HEMOGLOBIN: 16.3 g/dL — AB (ref 12.0–15.0)
POTASSIUM: 4.8 mmol/L (ref 3.5–5.1)
Sodium: 139 mmol/L (ref 135–145)
TCO2: 27 mmol/L (ref 22–32)

## 2017-04-12 LAB — COMPREHENSIVE METABOLIC PANEL
ALT: 15 U/L (ref 14–54)
AST: 23 U/L (ref 15–41)
Albumin: 3.4 g/dL — ABNORMAL LOW (ref 3.5–5.0)
Alkaline Phosphatase: 39 U/L (ref 38–126)
Anion gap: 8 (ref 5–15)
BUN: 7 mg/dL (ref 6–20)
CALCIUM: 8.3 mg/dL — AB (ref 8.9–10.3)
CHLORIDE: 108 mmol/L (ref 101–111)
CO2: 22 mmol/L (ref 22–32)
CREATININE: 0.76 mg/dL (ref 0.44–1.00)
Glucose, Bld: 89 mg/dL (ref 65–99)
Potassium: 3.7 mmol/L (ref 3.5–5.1)
Sodium: 138 mmol/L (ref 135–145)
Total Bilirubin: 0.8 mg/dL (ref 0.3–1.2)
Total Protein: 6.4 g/dL — ABNORMAL LOW (ref 6.5–8.1)

## 2017-04-12 LAB — CBC
HEMATOCRIT: 45.4 % (ref 36.0–46.0)
Hemoglobin: 14.8 g/dL (ref 12.0–15.0)
MCH: 28.7 pg (ref 26.0–34.0)
MCHC: 32.6 g/dL (ref 30.0–36.0)
MCV: 88.2 fL (ref 78.0–100.0)
Platelets: 243 10*3/uL (ref 150–400)
RBC: 5.15 MIL/uL — ABNORMAL HIGH (ref 3.87–5.11)
RDW: 12.4 % (ref 11.5–15.5)
WBC: 2.7 10*3/uL — ABNORMAL LOW (ref 4.0–10.5)

## 2017-04-12 LAB — DIFFERENTIAL
BASOS PCT: 0 %
Basophils Absolute: 0 10*3/uL (ref 0.0–0.1)
Eosinophils Absolute: 0 10*3/uL (ref 0.0–0.7)
Eosinophils Relative: 1 %
Lymphocytes Relative: 43 %
Lymphs Abs: 1.1 10*3/uL (ref 0.7–4.0)
MONO ABS: 0.3 10*3/uL (ref 0.1–1.0)
MONOS PCT: 11 %
NEUTROS ABS: 1.2 10*3/uL — AB (ref 1.7–7.7)
Neutrophils Relative %: 45 %

## 2017-04-12 LAB — I-STAT BETA HCG BLOOD, ED (MC, WL, AP ONLY)

## 2017-04-12 LAB — I-STAT TROPONIN, ED: TROPONIN I, POC: 0.01 ng/mL (ref 0.00–0.08)

## 2017-04-12 MED ORDER — NAPROXEN 500 MG PO TABS: 500.0000 mg | ORAL_TABLET | Freq: Two times a day (BID) | ORAL | 0 refills | Status: DC

## 2017-04-12 MED ORDER — CYCLOBENZAPRINE HCL 10 MG PO TABS: 10.0000 mg | ORAL_TABLET | Freq: Two times a day (BID) | ORAL | 0 refills | Status: DC | PRN

## 2017-04-12 NOTE — ED Triage Notes (Signed)
Pt  C/o pain/numbness/tingling to L side of head, neck, arm and torso. Pt c/o stabbing pain in L ear with reports of general malaise and fatigue x 2 weeks.+nausea.  Pt denies fever, denies emesis or diarrhea.

## 2017-04-12 NOTE — Discharge Instructions (Signed)
Take the medications as prescribed, follow up with a primary care doctor if the symptoms have not improved in the next week, return as needed for worsening symptoms

## 2017-04-12 NOTE — ED Provider Notes (Signed)
[facility] EMERGENCY DEPARTMENT Provider Note   CSN: [id number] Arrival date & time: 04/12/17  0605     History   Chief Complaint Chief Complaint  Patient presents with  . Numbness    HPI Madeline Larsen is a 32 y.o. female.  HPI Pt has a pins and needles sensation from the left side of the head to the waist area.  It feels like a pins and needles sensation.  Pt is also having pain in the left ear.  No rashes.  Lying on the left side increases the left pain.  No fevers.  No pain with movement.  No recent falls or injuries. Past Medical History:  Diagnosis Date  . Female-to-female transgender person   . MRSA (methicillin resistant staph aureus) culture positive     Patient Active Problem List   Diagnosis Date Noted  . 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 Status post left foot surgery 04/09/2015  . 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 FOOT SURGERY      OB History    No data available       Home Medications    Prior to Admission medications   Medication Sig Start Date End Date Taking? Authorizing Provider  acetaminophen (TYLENOL) 500 MG tablet Take 1,000 mg by mouth every 6 (six) hours as needed for pain.    [provider]  ALPRAZolam Prudy Feeler) 1 MG tablet Take 1 mg by mouth at bedtime as needed. for sleep 02/03/17   [provider]  cyclobenzaprine (FLEXERIL) 10 MG tablet Take 1 tablet (10 mg total) by mouth 2 (two) times daily as needed for muscle spasms. 04/12/17   Linwood Dibbles, MD  naproxen (NAPROSYN) 500 MG tablet Take 1 tablet (500 mg total) by mouth 2 (two) times daily. 04/12/17   Linwood Dibbles, MD  ondansetron (ZOFRAN ODT) 8 MG disintegrating tablet Take 1 tablet (8 mg total) by mouth every 8 (eight) hours as needed for nausea or vomiting. Patient not taking: Reported on 02/16/2017 09/18/16   Domenick Gong, MD  testosterone cypionate (DEPOTESTOSTERONE CYPIONATE) 200  MG/ML injection Inject 0.4 mLs into the muscle every Thursday. 02/03/17   [provider]    Family History No family history on file.  Social History Social History   Tobacco Use  . Smoking status: Never Smoker  . Smokeless tobacco: Never Used  Substance Use Topics  . Alcohol use: No    Alcohol/week: 0.0 oz  . Drug use: No     Allergies   Patient has no known allergies.   Review of Systems Review of Systems  All other systems reviewed and are negative.    Physical Exam Updated Vital Signs BP 125/79   Pulse (!) 51   Temp 98.2 F (36.8 C) (Oral)   Resp (!) 0   Ht 1.575 m (5\' 2" )   Wt 95.3 kg (210 lb)   SpO2 100%   BMI 38.41 kg/m   Physical Exam  Constitutional: She appears well-developed and well-nourished. No distress.  Female gender  identity  HENT:  Head: Normocephalic and atraumatic.  Right Ear: External ear normal.  Left Ear: External ear normal.  Eyes: Conjunctivae are normal. Right eye exhibits no discharge. Left eye exhibits no discharge. No scleral icterus.  Neck: Neck supple. Muscular tenderness (left) present. No neck rigidity. No tracheal deviation present.  Cardiovascular: Normal rate, regular rhythm and intact distal pulses.  Pulmonary/Chest: Effort normal and  breath sounds normal. No stridor. No respiratory distress. She has no wheezes. She has no rales.  Abdominal: Soft. Bowel sounds are normal. She exhibits no distension. There is no tenderness. There is no rebound and no guarding.  Musculoskeletal: She exhibits no edema or tenderness.  Neurological: She is alert. She has normal strength. No cranial nerve deficit (no facial droop, extraocular movements intact, no slurred speech) or sensory deficit. She exhibits normal muscle tone. She displays no seizure activity. Coordination normal.  Normal strength and sensation in all extremities,  Skin: Skin is warm and dry. No rash noted. She is not diaphoretic.  Psychiatric: She has a normal mood  and affect.  Nursing note and vitals reviewed.    ED Treatments / Results  Labs (all labs ordered are listed, but only abnormal results are displayed) Labs Reviewed  CBC - Abnormal; Notable for the following components:      Result Value   WBC 2.7 (*)    RBC 5.15 (*)    All other components within normal limits  DIFFERENTIAL - Abnormal; Notable for the following components:   Neutro Abs 1.2 (*)    All other components within normal limits  COMPREHENSIVE METABOLIC PANEL - Abnormal; Notable for the following components:   Calcium 8.3 (*)    Total Protein 6.4 (*)    Albumin 3.4 (*)    All other components within normal limits  I-STAT CHEM 8, ED - Abnormal; Notable for the following components:   Hemoglobin 16.3 (*)    HCT 48.0 (*)    All other components within normal limits  I-STAT TROPONIN, ED  I-STAT BETA HCG BLOOD, ED ([facility], [facility], [facility] ONLY)    EKG  EKG Interpretation  Date/Time:  Sunday April 12 2017 07:55:05 EST Ventricular Rate:  54 PR Interval:    QRS Duration: 78 QT Interval:  413 QTC Calculation: 392 R Axis:   56 Text Interpretation:  Sinus rhythm Borderline short PR interval Consider left atrial enlargement Baseline wander in lead(s) V2 No significant change since last tracing Confirmed by Linwood Dibbles [phone]) on 04/12/2017 8:01:10 AM Also confirmed by Linwood Dibbles [phone]), editor Barbette Hair [phone])  on 04/12/2017 8:22:08 AM       Radiology Dg Cervical Spine Complete  Result Date: 04/12/2017 CLINICAL DATA:  Left neck pain/numbness EXAM: CERVICAL SPINE - COMPLETE 4+ VIEW COMPARISON:  None. FINDINGS: Normal cervical lordosis. No evidence of fracture or dislocation. Vertebral body heights and intervertebral disc spaces are maintained. Dens appears intact.  Lateral masses of C1 are symmetric. Prevertebral soft tissues are within normal limits. Right neural foramina are patent. Left neural foramina are likely patent, but are partially obscured due to obliquity. Visualized  lung apices are clear. IMPRESSION: Negative cervical spine radiographs. Electronically Signed   By: Charline Bills M.D.   On: 04/12/2017 07:48    Procedures Procedures (including critical care time)  Medications Ordered in ED Medications - No data to display   Initial Impression / Assessment and Plan / ED Course  I have reviewed the triage vital signs and the nursing notes.  Pertinent labs & imaging results that were available during my care of the patient were reviewed by me and considered in my medical decision making (see chart for details).   Labs are unremarkable.  Chronic low WBC noted but I do not think that is clinically significant.  I discussed outpatient follow-up with a primary care doctor.  Mild decreased calcium and protein levels but again I do not think  that is clinically significant.  X-rays without any significant abnormalities.  Neurologic exam is normal.  I doubt stroke, TIA or brain mass.  Sx may be related to cervical impingement.  Plan on discharge home with muscle relaxant anti-inflammatory medications.  Discussed follow-up with primary care doctor.  Final Clinical Impressions(s) / ED Diagnoses   Final diagnoses:  Paresthesia    ED Discharge Orders        Ordered    cyclobenzaprine (FLEXERIL) 10 MG tablet  2 times daily PRN     04/12/17 0831    naproxen (NAPROSYN) 500 MG tablet  2 times daily     04/12/17 0831       Linwood DibblesKnapp, Kambri Dismore, MD 04/12/17 [phone]

## 2017-05-27 ENCOUNTER — Telehealth: Payer: Self-pay | Admitting: Endocrinology

## 2017-05-27 NOTE — Telephone Encounter (Signed)
OK (I assume this is for transgender hormonal rx).

## 2017-05-27 NOTE — Telephone Encounter (Signed)
Patient was seeing Dr. Rosario Jacksonald Pitt[place] [facility] without giving patient a referral or his records. Patient would like to become your patient for hormones therapy (patient has a little bit of the medication(hormone) left at this time. Patient's ph# is [phone]

## 2017-05-28 NOTE — Telephone Encounter (Signed)
FYI, Dr. Everardo AllEllison I made appointment for 06/18/17. Yes this is a transgender patient.

## 2017-06-18 ENCOUNTER — Ambulatory Visit (INDEPENDENT_AMBULATORY_CARE_PROVIDER_SITE_OTHER): Payer: No Typology Code available for payment source | Admitting: Endocrinology

## 2017-06-18 DIAGNOSIS — R51 Headache: Secondary | ICD-10-CM | POA: Diagnosis not present

## 2017-06-18 DIAGNOSIS — F640 Transsexualism: Secondary | ICD-10-CM

## 2017-06-18 DIAGNOSIS — R519 Headache, unspecified: Secondary | ICD-10-CM | POA: Insufficient documentation

## 2017-06-18 DIAGNOSIS — Z789 Other specified health status: Secondary | ICD-10-CM

## 2017-06-18 LAB — CBC WITH DIFFERENTIAL/PLATELET
Basophils Absolute: 0 10*3/uL (ref 0.0–0.1)
Basophils Relative: 0.4 % (ref 0.0–3.0)
EOS PCT: 1.1 % (ref 0.0–5.0)
Eosinophils Absolute: 0 10*3/uL (ref 0.0–0.7)
HCT: 44.3 % (ref 36.0–46.0)
Hemoglobin: 14.6 g/dL (ref 12.0–15.0)
Lymphocytes Relative: 38.6 % (ref 12.0–46.0)
Lymphs Abs: 1.1 10*3/uL (ref 0.7–4.0)
MCHC: 33 g/dL (ref 30.0–36.0)
MCV: 86.4 fl (ref 78.0–100.0)
MONO ABS: 0.4 10*3/uL (ref 0.1–1.0)
MONOS PCT: 13.6 % — AB (ref 3.0–12.0)
NEUTROS PCT: 46.3 % (ref 43.0–77.0)
Neutro Abs: 1.3 10*3/uL — ABNORMAL LOW (ref 1.4–7.7)
Platelets: 241 10*3/uL (ref 150.0–400.0)
RBC: 5.13 Mil/uL — AB (ref 3.87–5.11)
RDW: 12.2 % (ref 11.5–15.5)
WBC: 2.8 10*3/uL — AB (ref 4.0–10.5)

## 2017-06-18 LAB — LIPID PANEL
CHOL/HDL RATIO: 4
Cholesterol: 146 mg/dL (ref 0–200)
HDL: 39.1 mg/dL (ref >39.00)
LDL Cholesterol: 86 mg/dL (ref 0–99)
NONHDL: 107.27
Triglycerides: 106 mg/dL (ref 0.0–149.0)
VLDL: 21.2 mg/dL (ref 0.0–40.0)

## 2017-06-18 LAB — SEDIMENTATION RATE: SED RATE: 26 mm/h — AB (ref 0–20)

## 2017-06-18 LAB — TSH: TSH: 0.6 u[IU]/mL (ref 0.35–4.50)

## 2017-06-18 NOTE — Progress Notes (Signed)
Subjective:    Patient ID: Madeline Larsen, female    DOB: 1985/12/29, 32 y.o.   MRN: [id number]  HPI New pt today, for transgender state (F to M).  Pt reports he first suspected the transgender state at age 89.  He had puberty at the age of 32.  He has never had surgery for this, but he has been to counseling in the past.  He has no children.  He has no h/o DVT, dyslipidemia, liver disease, kidney disease, migraine, sleep apnea, heart disease, CVA, acne, diabetes, smoking, HTN, epilepsy, violent behavior, gallbladder disease, blood disorders, osteoporosis, or cancer.  He has been on testosterone rx since 2015.  He will have bilat mastectomies later this year ([place] Festus Barren[place]arramone[place]  He has slight headache, and assoc nausea. Last dose of IM testosterone was 1 week ago.   Past Medical History:  Diagnosis Date  . Female-to-female transgender person   . MRSA (methicillin resistant staph aureus) culture positive     Past Surgical History:  Procedure Laterality Date  . FOOT SURGERY      Social History   Socioeconomic History  . Marital status: Married    Spouse name: Not on file  . Number of children: Not on file  . Years of education: Not on file  . Highest education level: Not on file  Occupational History  . Not on file  Social Needs  . Financial resource strain: Not on file  . Food insecurity:    Worry: Not on file    Inability: Not on file  . Transportation needs:    Medical: Not on file    Non-medical: Not on file  Tobacco Use  . Smoking status: Never Smoker  . Smokeless tobacco: Never Used  Substance and Sexual Activity  . Alcohol use: No    Alcohol/week: 0.0 oz  . Drug use: No  . Sexual activity: Not on file  Lifestyle  . Physical activity:    Days per week: Not on file    Minutes per session: Not on file  . Stress: Not on file  Relationships  . Social connections:    Talks on phone: Not on file    Gets together: Not on file    Attends religious service:  Not on file    Active member of club or organization: Not on file    Attends meetings of clubs or organizations: Not on file    Relationship status: Not on file  . Intimate partner violence:    Fear of current or ex partner: Not on file    Emotionally abused: Not on file    Physically abused: Not on file    Forced sexual activity: Not on file  Other Topics Concern  . Not on file  Social History Narrative  . Not on file    Current Outpatient Medications on File Prior to Visit  Medication Sig Dispense Refill  . ALPRAZolam (XANAX) 1 MG tablet Take 1 mg by mouth at bedtime as needed. for sleep  0  . testosterone cypionate (DEPOTESTOSTERONE CYPIONATE) 200 MG/ML injection Inject 0.4 mLs into the muscle every Thursday.  0  . acetaminophen (TYLENOL) 500 MG tablet Take 1,000 mg by mouth every 6 (six) hours as needed for pain.    . cyclobenzaprine (FLEXERIL) 10 MG tablet Take 1 tablet (10 mg total) by mouth 2 (two) times daily as needed for muscle spasms. (Patient not taking: Reported on 06/18/2017) 20 tablet 0  . naproxen (NAPROSYN)  500 MG tablet Take 1 tablet (500 mg total) by mouth 2 (two) times daily. (Patient not taking: Reported on 06/18/2017) 30 tablet 0  . ondansetron (ZOFRAN ODT) 8 MG disintegrating tablet Take 1 tablet (8 mg total) by mouth every 8 (eight) hours as needed for nausea or vomiting. (Patient not taking: Reported on 02/16/2017) 20 tablet 0   No current facility-administered medications on file prior to visit.     No Known Allergies  No family history on file.  BP 104/72 (BP Location: Left Arm, Patient Position: Sitting, Cuff Size: Normal)   Pulse 71   Wt 217 lb 3.2 oz (98.5 kg)   SpO2 96%   BMI 39.73 kg/m    Review of Systems Denies weight change, visual loss, chest pain, sob, vomiting, hematuria, acne, easy bruising, seizure, edema, heat intolerance, and rhinorrhea.  He has no menses.  He has myalgias, diaphoresis, and anxiety.      Objective:   Physical  Exam VS: see vs page GEN: no distress HEAD: head: no deformity eyes: no periorbital swelling, no proptosis external nose and ears are normal mouth: no lesion seen NECK: supple, thyroid is not enlarged CHEST WALL: no deformity LUNGS:  Clear to auscultation BREASTS:  Tanner 4 CV: reg rate and rhythm, no murmur ABD: abdomen is soft, nontender.  no hepatosplenomegaly.  not distended.  no hernia GENITALIA:  Normal external female, except for 2 cm clitoris.   MUSCULOSKELETAL: muscle bulk and strength are grossly normal.  no obvious joint swelling.  gait is normal and steady EXTEMITIES: no deformity.  no ulcer on the feet.  feet are of normal color and temp.  no leg edema PULSES: dorsalis pedis intact bilat.  no carotid bruit NEURO:  cn 2-12 grossly intact.   readily moves all 4's.  sensation is intact to touch on the feet SKIN:  Normal texture and temperature.  No rash or suspicious lesion is visible.  Normal female terminal hair distribution.  No acne NODES:  None palpable at the neck PSYCH: alert, well-oriented.  Does not appear anxious nor depressed.  I have reviewed outside records, and summarized: Pt was recently seen in ER for paresthesias, but no cause was found     Assessment & Plan:  Transgender state, new to me: he declines genital surgery.  Please continue the same medication for now.  Patient Instructions  blood tests are requested for you today.  We'll let you know about the results.  Testosterone treatment has risks, including increased hair loss, blood clots, liver problems, lower hdl ("good cholesterol"), polycythemia (opposite of anemia), sleep apnea, and behavior changes.  We always have to weigh the treatment benefits against these side effects.   Please come back for a follow-up appointment in 1 year.

## 2017-06-18 NOTE — Patient Instructions (Addendum)
blood tests are requested for you today.  We'll let you know about the results.  Testosterone treatment has risks, including increased hair loss, blood clots, liver problems, lower hdl ("good cholesterol"), polycythemia (opposite of anemia), sleep apnea, and behavior changes.  We always have to weigh the treatment benefits against these side effects.   Please come back for a follow-up appointment in 1 year.

## 2017-06-20 LAB — TESTOSTERONE,FREE AND TOTAL
TESTOSTERONE FREE: 3 pg/mL (ref 0.0–4.2)
TESTOSTERONE: 141 ng/dL — AB (ref 8–48)

## 2017-06-24 LAB — ESTRADIOL, FREE
Estradiol, Free: 0.79 pg/mL
Estradiol: 36 pg/mL

## 2017-07-02 ENCOUNTER — Encounter: Payer: Self-pay | Admitting: Nurse Practitioner

## 2017-07-02 ENCOUNTER — Ambulatory Visit: Payer: No Typology Code available for payment source | Admitting: Nurse Practitioner

## 2017-07-02 VITALS — BP 120/78 | HR 66 | Temp 97.9°F | Ht 62.0 in | Wt 218.2 lb

## 2017-07-02 DIAGNOSIS — K219 Gastro-esophageal reflux disease without esophagitis: Secondary | ICD-10-CM

## 2017-07-02 DIAGNOSIS — R1084 Generalized abdominal pain: Secondary | ICD-10-CM | POA: Diagnosis not present

## 2017-07-02 DIAGNOSIS — K5900 Constipation, unspecified: Secondary | ICD-10-CM | POA: Diagnosis not present

## 2017-07-02 MED ORDER — OMEPRAZOLE 20 MG PO CPDR: 20.0000 mg | DELAYED_RELEASE_CAPSULE | Freq: Every day | ORAL | 3 refills | Status: DC

## 2017-07-02 MED ORDER — SENNOSIDES-DOCUSATE SODIUM 8.6-50 MG PO TABS: 1.0000 | ORAL_TABLET | Freq: Every day | ORAL | 0 refills | Status: DC

## 2017-07-02 MED ORDER — SACCHAROMYCES BOULARDII 250 MG PO CAPS: 250.0000 mg | ORAL_CAPSULE | Freq: Two times a day (BID) | ORAL | Status: DC

## 2017-07-02 MED ORDER — LACTULOSE 10 GM/15ML PO SOLN: 20.0000 g | Freq: Every day | ORAL | 0 refills | Status: DC | PRN

## 2017-07-02 NOTE — Progress Notes (Addendum)
Subjective:  Patient ID: Madeline Larsen, female    DOB: 06-20-1985  Age: 32 y.o. MRN: [id number]  CC: Establish Care (est care.stomach issues, nauseous all the time , difficult bowel movements. Body aches all the time.difficulty sleeping.) and Headache (has been getting terrible headaches at least once a week.)  Madeline Larsen is a transgender female. She is transitioning to female with help of hormonal treatment. Managed by Dr. Everardo AllEllison. Previous endocrinology: Dr. Dahlia B[facility]iffPettiway [facility]. She is here to establish care with pcp. No previous pcp.  Abdominal Pain  This is a chronic problem. The current episode started more than 1 year ago. The onset quality is gradual. The problem occurs constantly. The problem has been gradually worsening. The pain is located in the generalized abdominal region. The quality of the pain is colicky, a sensation of fullness and cramping. The abdominal pain does not radiate. Associated symptoms include constipation, frequency, headaches and nausea. Pertinent negatives include no anorexia, arthralgias, belching, diarrhea, dysuria, fever, flatus, hematochezia, hematuria, melena, myalgias, vomiting or weight loss. Nothing aggravates the pain. The pain is relieved by nothing. She has tried antacids (miralax, colace, high fiber diet and adequate oral hydration) for the symptoms. Prior diagnostic workup includes CT scan and ultrasound.   ABD pain: Onset several years. Constipation, nausea, Stomach fullness. Use of zofran, miralax, colace, peppermint. Report balanced diet with high fiber, and 3-20oz bottles of water. No rectal pain, no hemorrhoids.  Outpatient Medications Prior to Visit  Medication Sig Dispense Refill  . acetaminophen (TYLENOL) 500 MG tablet Take 1,000 mg by mouth every 6 (six) hours as needed for pain.    Marland Kitchen. ALPRAZolam (XANAX) 1 MG tablet Take 1 mg by mouth at bedtime as needed. for sleep  0  . testosterone cypionate (DEPOTESTOSTERONE CYPIONATE) 200 MG/ML  injection Inject 0.4 mLs into the muscle every Thursday.  0  . cyclobenzaprine (FLEXERIL) 10 MG tablet Take 1 tablet (10 mg total) by mouth 2 (two) times daily as needed for muscle spasms. (Patient not taking: Reported on 06/18/2017) 20 tablet 0  . ibuprofen (ADVIL,MOTRIN) 200 MG tablet Take by mouth.    . naproxen (NAPROSYN) 500 MG tablet Take 1 tablet (500 mg total) by mouth 2 (two) times daily. (Patient not taking: Reported on 06/18/2017) 30 tablet 0  . ondansetron (ZOFRAN ODT) 8 MG disintegrating tablet Take 1 tablet (8 mg total) by mouth every 8 (eight) hours as needed for nausea or vomiting. (Patient not taking: Reported on 02/16/2017) 20 tablet 0   No facility-administered medications prior to visit.     ROS Review of Systems  Constitutional: Negative for fever, malaise/fatigue and weight loss.  HENT: Negative.   Respiratory: Negative.   Cardiovascular: Negative.   Gastrointestinal: Positive for abdominal pain, constipation and nausea. Negative for anorexia, diarrhea, flatus, hematochezia, melena and vomiting.  Genitourinary: Positive for frequency. Negative for dysuria and hematuria.  Musculoskeletal: Negative for arthralgias, falls and myalgias.  Skin: Negative.   Neurological: Positive for headaches.  Endo/Heme/Allergies: Negative for polydipsia.  Psychiatric/Behavioral: The patient has insomnia.    Social History   Socioeconomic History  . Marital status: Married    Spouse name: Not on file  . Number of children: Not on file  . Years of education: Not on file  . Highest education level: Not on file  Occupational History  . Not on file  Social Needs  . Financial resource strain: Not on file  . Food insecurity:    Worry: Not on file  Inability: Not on file  . Transportation needs:    Medical: Not on file    Non-medical: Not on file  Tobacco Use  . Smoking status: Never Smoker  . Smokeless tobacco: Never Used  Substance and Sexual Activity  . Alcohol use: Yes     Alcohol/week: 0.0 oz    Comment: socially  . Drug use: Yes    Types: Marijuana  . Sexual activity: Yes    Birth control/protection: None    Comment: female partner  Lifestyle  . Physical activity:    Days per week: Not on file    Minutes per session: Not on file  . Stress: Not on file  Relationships  . Social connections:    Talks on phone: Not on file    Gets together: Not on file    Attends religious service: Not on file    Active member of club or organization: Not on file    Attends meetings of clubs or organizations: Not on file    Relationship status: Not on file  . Intimate partner violence:    Fear of current or ex partner: Not on file    Emotionally abused: Not on file    Physically abused: Not on file    Forced sexual activity: Not on file  Other Topics Concern  . Not on file  Social History Narrative  . Not on file   Family History  Problem Relation Age of Onset  . Alcohol abuse Mother   . Cancer Mother   . Drug abuse Mother   . Early death Mother   . HIV/AIDS Mother   . Alcohol abuse Father   . Drug abuse Father   . Early death Father   . Muscular dystrophy Father   . Alcohol abuse Sister   . Depression Sister   . Drug abuse Sister   . Depression Brother   . Kidney disease Maternal Grandmother        ESRD on dialysis  . Hypertension Maternal Grandmother    Objective:  BP 120/78 (BP Location: Left Arm, Patient Position: Sitting, Cuff Size: Large)   Pulse 66   Temp 97.9 F (36.6 C) (Oral)   Ht 5\' 2"  (1.575 m)   Wt 218 lb 3.2 oz (99 kg)   SpO2 100%   BMI 39.91 kg/m   BP Readings from Last 3 Encounters:  07/02/17 120/78  06/18/17 104/72  04/12/17 120/69    Wt Readings from Last 3 Encounters:  07/02/17 218 lb 3.2 oz (99 kg)  06/18/17 217 lb 3.2 oz (98.5 kg)  04/12/17 210 lb (95.3 kg)    Physical Exam  Constitutional: She is oriented to person, place, and time. No distress.  Neck: Normal range of motion. Neck supple.  Cardiovascular:  Normal rate.  Pulmonary/Chest: Effort normal.  Abdominal: Soft. Bowel sounds are normal. She exhibits no distension and no mass. There is tenderness. There is no guarding.  Musculoskeletal: Normal range of motion.  Neurological: She is alert and oriented to person, place, and time.  Skin: Skin is warm.  Vitals reviewed.   Lab Results  Component Value Date   WBC 2.8 (L) 06/18/2017   HGB 14.6 06/18/2017   HCT 44.3 06/18/2017   PLT 241.0 06/18/2017   GLUCOSE 89 04/12/2017   CHOL 146 06/18/2017   TRIG 106.0 06/18/2017   HDL 39.10 06/18/2017   LDLCALC 86 06/18/2017   ALT 15 04/12/2017   AST 23 04/12/2017   NA 138 04/12/2017   K  3.7 04/12/2017   CL 108 04/12/2017   CREATININE 0.76 04/12/2017   BUN 7 04/12/2017   CO2 22 04/12/2017   TSH 0.60 06/18/2017    Dg Cervical Spine Complete  Result Date: 04/12/2017 CLINICAL DATA:  Left neck pain/numbness EXAM: CERVICAL SPINE - COMPLETE 4+ VIEW COMPARISON:  None. FINDINGS: Normal cervical lordosis. No evidence of fracture or dislocation. Vertebral body heights and intervertebral disc spaces are maintained. Dens appears intact.  Lateral masses of C1 are symmetric. Prevertebral soft tissues are within normal limits. Right neural foramina are patent. Left neural foramina are likely patent, but are partially obscured due to obliquity. Visualized lung apices are clear. IMPRESSION: Negative cervical spine radiographs. Electronically Signed   By: Charline Bills M.D.   On: 04/12/2017 07:48    Assessment & Plan:   Madeline Larsen was seen today for establish care and headache.  Diagnoses and all orders for this visit:  Generalized abdominal pain -     H. pylori antigen, stool -     saccharomyces boulardii (FLORASTOR) 250 MG capsule; Take 1 capsule (250 mg total) by mouth 2 (two) times daily.  Gastroesophageal reflux disease without esophagitis -     H. pylori antigen, stool -     omeprazole (PRILOSEC) 20 MG capsule; Take 1 capsule (20 mg total) by  mouth daily.  Constipation, unspecified constipation type -     lactulose (CHRONULAC) 10 GM/15ML solution; Take 30 mLs (20 g total) by mouth daily as needed for mild constipation. -     senna-docusate (SENOKOT-S) 8.6-50 MG tablet; Take 1 tablet by mouth at bedtime.   I am having Madeline Larsen start on lactulose, senna-docusate, omeprazole, and saccharomyces boulardii. I am also having Madeline Larsen maintain Madeline Larsen ondansetron, testosterone cypionate, ALPRAZolam, acetaminophen, cyclobenzaprine, naproxen, and ibuprofen.  Meds ordered this encounter  Medications  . lactulose (CHRONULAC) 10 GM/15ML solution    Sig: Take 30 mLs (20 g total) by mouth daily as needed for mild constipation.    Dispense:  240 mL    Refill:  0    Order Specific Question:   Supervising Provider    Answer:   MATTHEWS, CODY [[id number]]  . senna-docusate (SENOKOT-S) 8.6-50 MG tablet    Sig: Take 1 tablet by mouth at bedtime.    Dispense:  30 tablet    Refill:  0    Order Specific Question:   Supervising Provider    Answer:   MATTHEWS, CODY [[id number]]  . omeprazole (PRILOSEC) 20 MG capsule    Sig: Take 1 capsule (20 mg total) by mouth daily.    Dispense:  30 capsule    Refill:  3    Order Specific Question:   Supervising Provider    Answer:   MATTHEWS, CODY [[id number]]  . saccharomyces boulardii (FLORASTOR) 250 MG capsule    Sig: Take 1 capsule (250 mg total) by mouth 2 (two) times daily.    Order Specific Question:   Supervising Provider    Answer:   Everrett Coombe [[id number]]    Follow-up: Return in about 2 weeks (around 07/16/2017) for constipation, needs PHQ and GAD-7 completed.Alysia Penna, NP

## 2017-07-02 NOTE — Patient Instructions (Signed)

## 2017-07-04 LAB — H. PYLORI ANTIGEN, STOOL: H pylori Ag, Stl: NEGATIVE

## 2017-07-15 ENCOUNTER — Telehealth: Payer: Self-pay | Admitting: Endocrinology

## 2017-07-15 ENCOUNTER — Other Ambulatory Visit: Payer: Self-pay | Admitting: Endocrinology

## 2017-07-15 MED ORDER — TESTOSTERONE CYPIONATE 200 MG/ML IM SOLN: 80.0000 mg | INTRAMUSCULAR | 0 refills | Status: DC

## 2017-07-15 NOTE — Telephone Encounter (Signed)
testosterone cypionate (DEPOTESTOSTERONE CYPIONATE) 200 MG/ML injection [[id number]]  [id number] - [place],  - [address] AT [facility] [address] DEA #:  [id number]

## 2017-07-15 NOTE — Telephone Encounter (Signed)
I sent refill 

## 2017-07-17 ENCOUNTER — Ambulatory Visit (INDEPENDENT_AMBULATORY_CARE_PROVIDER_SITE_OTHER): Payer: No Typology Code available for payment source | Admitting: Nurse Practitioner

## 2017-07-17 ENCOUNTER — Encounter: Payer: Self-pay | Admitting: Nurse Practitioner

## 2017-07-17 VITALS — BP 118/70 | HR 73 | Temp 97.6°F | Ht 62.0 in | Wt 223.8 lb

## 2017-07-17 DIAGNOSIS — R1084 Generalized abdominal pain: Secondary | ICD-10-CM | POA: Diagnosis not present

## 2017-07-17 DIAGNOSIS — K5900 Constipation, unspecified: Secondary | ICD-10-CM

## 2017-07-17 DIAGNOSIS — K219 Gastro-esophageal reflux disease without esophagitis: Secondary | ICD-10-CM

## 2017-07-17 LAB — HEPATIC FUNCTION PANEL
ALK PHOS: 45 U/L (ref 39–117)
ALT: 13 U/L (ref 0–35)
AST: 17 U/L (ref 0–37)
Albumin: 3.9 g/dL (ref 3.5–5.2)
Bilirubin, Direct: 0.1 mg/dL (ref 0.0–0.3)
Total Bilirubin: 0.5 mg/dL (ref 0.2–1.2)
Total Protein: 6.9 g/dL (ref 6.0–8.3)

## 2017-07-17 MED ORDER — HYOSCYAMINE SULFATE 0.125 MG PO TABS: 0.1250 mg | ORAL_TABLET | Freq: Four times a day (QID) | ORAL | 1 refills | Status: DC | PRN

## 2017-07-17 NOTE — Patient Instructions (Addendum)
Go to lab for blood draw and urine collection.  Let me know if no improvement in 2weeks. I will then enter referral to GI.  Discuss need for total hysterectomy with Dr. Lanna Poche. Let me know if you need referral to GYN.  Irritable Bowel Syndrome, Adult Irritable bowel syndrome (IBS) is not one specific disease. It is a group of symptoms that affects the organs responsible for digestion (gastrointestinal or GI tract). To regulate how your GI tract works, your body sends signals back and forth between your intestines and your brain. If you have IBS, there may be a problem with these signals. As a result, your GI tract does not function normally. Your intestines may become more sensitive and overreact to certain things. This is especially true when you eat certain foods or when you are under stress. There are four types of IBS. These may be determined based on the consistency of your stool:  IBS with diarrhea.  IBS with constipation.  Mixed IBS.  Unsubtyped IBS.  It is important to know which type of IBS you have. Some treatments are more likely to be helpful for certain types of IBS. What are the causes? The exact cause of IBS is not known. What increases the risk? You may have a higher risk of IBS if:  You are a woman.  You are younger than 34 years old.  You have a family history of IBS.  You have mental health problems.  You have had bacterial infection of your GI tract.  What are the signs or symptoms? Symptoms of IBS vary from person to person. The main symptom is abdominal pain or discomfort. Additional symptoms usually include one or more of the following:  Diarrhea, constipation, or both.  Abdominal swelling or bloating.  Feeling full or sick after eating a small or regular-size meal.  Frequent gas.  Mucus in the stool.  A feeling of having more stool left after a bowel movement.  Symptoms tend to come and go. They may be associated with stress, psychiatric  conditions, or nothing at all. How is this diagnosed? There is no specific test to diagnose IBS. Your health care provider will make a diagnosis based on a physical exam, medical history, and your symptoms. You may have other tests to rule out other conditions that may be causing your symptoms. These may include:  Blood tests.  X-rays.  CT scan.  Endoscopy and colonoscopy. This is a test in which your GI tract is viewed with a long, thin, flexible tube.  How is this treated? There is no cure for IBS, but treatment can help relieve symptoms. IBS treatment often includes:  Changes to your diet, such as: ? Eating more fiber. ? Avoiding foods that cause symptoms. ? Drinking more water. ? Eating regular, medium-sized portioned meals.  Medicines. These may include: ? Fiber supplements if you have constipation. ? Medicine to control diarrhea (antidiarrheal medicines). ? Medicine to help control muscle spasms in your GI tract (antispasmodic medicines). ? Medicines to help with any mental health issues, such as antidepressants or tranquilizers.  Therapy. ? Talk therapy may help with anxiety, depression, or other mental health issues that can make IBS symptoms worse.  Stress reduction. ? Managing your stress can help keep symptoms under control.  Follow these instructions at home:  Take medicines only as directed by your health care provider.  Eat a healthy diet. ? Avoid foods and drinks with added sugar. ? Include more whole grains, fruits, and vegetables gradually  into your diet. This may be especially helpful if you have IBS with constipation. ? Avoid any foods and drinks that make your symptoms worse. These may include dairy products and caffeinated or carbonated drinks. ? Do not eat large meals. ? Drink enough fluid to keep your urine clear or pale yellow.  Exercise regularly. Ask your health care provider for recommendations of good activities for you.  Keep all follow-up  visits as directed by your health care provider. This is important. Contact a health care provider if:  You have constant pain.  You have trouble or pain with swallowing.  You have worsening diarrhea. Get help right away if:  You have severe and worsening abdominal pain.  You have diarrhea and: ? You have a rash, stiff neck, or severe headache. ? You are irritable, sleepy, or difficult to awaken. ? You are weak, dizzy, or extremely thirsty.  You have bright red blood in your stool or you have black tarry stools.  You have unusual abdominal swelling that is painful.  You vomit continuously.  You vomit blood (hematemesis).  You have both abdominal pain and a fever. This information is not intended to replace advice given to you by your health care provider. Make sure you discuss any questions you have with your health care provider. Document Released: 02/24/2005 Document Revised: 07/27/2015 Document Reviewed: 11/11/2013 Elsevier Interactive Patient Education  2018 [employer].

## 2017-07-17 NOTE — Progress Notes (Signed)
Subjective:  Patient ID: Madeline Larsen, female    DOB: May 11, 1985  Age: 31 y.o. MRN: [id number]  CC: Follow-up (BM still better 3x daily/abd pain,near belly button area/denie tdap)  HPI   ABD pain: Chronic, onset over 1year ago, waxing and waning, worse at night. Not affected by food intake.  Constipation: Improved with use of florastor, metamucil and sennakot.  GERD: Improved with omeprazole.  Outpatient Medications Prior to Visit  Medication Sig Dispense Refill  . acetaminophen (TYLENOL) 500 MG tablet Take 1,000 mg by mouth every 6 (six) hours as needed for pain.    Marland Kitchen ALPRAZolam (XANAX) 1 MG tablet Take 1 mg by mouth at bedtime as needed. for sleep  0  . ibuprofen (ADVIL,MOTRIN) 200 MG tablet Take by mouth.    . naproxen (NAPROSYN) 500 MG tablet Take 1 tablet (500 mg total) by mouth 2 (two) times daily. 30 tablet 0  . omeprazole (PRILOSEC) 20 MG capsule Take 1 capsule (20 mg total) by mouth daily. 30 capsule 3  . saccharomyces boulardii (FLORASTOR) 250 MG capsule Take 1 capsule (250 mg total) by mouth 2 (two) times daily.    Marland Kitchen senna-docusate (SENOKOT-S) 8.6-50 MG tablet Take 1 tablet by mouth at bedtime. 30 tablet 0  . testosterone cypionate (DEPOTESTOSTERONE CYPIONATE) 200 MG/ML injection Inject 0.4 mLs (80 mg total) into the muscle every Thursday. 10 mL 0  . lactulose (CHRONULAC) 10 GM/15ML solution Take 30 mLs (20 g total) by mouth daily as needed for mild constipation. 240 mL 0  . cyclobenzaprine (FLEXERIL) 10 MG tablet Take 1 tablet (10 mg total) by mouth 2 (two) times daily as needed for muscle spasms. (Patient not taking: Reported on 06/18/2017) 20 tablet 0  . ondansetron (ZOFRAN ODT) 8 MG disintegrating tablet Take 1 tablet (8 mg total) by mouth every 8 (eight) hours as needed for nausea or vomiting. (Patient not taking: Reported on 02/16/2017) 20 tablet 0   No facility-administered medications prior to visit.     ROS See HPI  Objective:  BP 118/70   Pulse 73    Temp 97.6 F (36.4 C) (Oral)   Ht  (1.575 m)   Wt 223 lb 12.8 oz (101.5 kg)   SpO2 100%   BMI 40.93 kg/m   BP Readings from Last 3 Encounters:  07/17/17 118/70  07/02/17 120/78  06/18/17 104/72    Wt Readings from Last 3 Encounters:  07/17/17 223 lb 12.8 oz (101.5 kg)  07/02/17 218 lb 3.2 oz (99 kg)  06/18/17 217 lb 3.2 oz (98.5 kg)    Physical Exam  Constitutional: She is oriented to person, place, and time.  Cardiovascular: Normal rate.  Pulmonary/Chest: Effort normal.  Abdominal: Soft. Bowel sounds are normal. She exhibits no distension. There is tenderness in the periumbilical area and suprapubic area. There is no guarding.  Neurological: She is alert and oriented to person, place, and time.  Skin: Skin is warm and dry. No erythema.  Vitals reviewed.   Lab Results  Component Value Date   WBC 2.8 (L) 06/18/2017   HGB 14.6 06/18/2017   HCT 44.3 06/18/2017   PLT 241.0 06/18/2017   GLUCOSE 89 04/12/2017   CHOL 146 06/18/2017   TRIG 106.0 06/18/2017   HDL 39.10 06/18/2017   LDLCALC 86 06/18/2017   ALT 15 04/12/2017   AST 23 04/12/2017   NA 138 04/12/2017   K 3.7 04/12/2017   CL 108 04/12/2017   CREATININE 0.76 04/12/2017   BUN 7 04/12/2017  CO2 22 04/12/2017   TSH 0.60 06/18/2017    Dg Cervical Spine Complete  Result Date: 04/12/2017 CLINICAL DATA:  Left neck pain/numbness EXAM: CERVICAL SPINE - COMPLETE 4+ VIEW COMPARISON:  None. FINDINGS: Normal cervical lordosis. No evidence of fracture or dislocation. Vertebral body heights and intervertebral disc spaces are maintained. Dens appears intact.  Lateral masses of C1 are symmetric. Prevertebral soft tissues are within normal limits. Right neural foramina are patent. Left neural foramina are likely patent, but are partially obscured due to obliquity. Visualized lung apices are clear. IMPRESSION: Negative cervical spine radiographs. Electronically Signed   By: Charline Bills M.D.   On: 04/12/2017 07:48     Assessment & Plan:   Alizea was seen today for follow-up.  Diagnoses and all orders for this visit:  Generalized abdominal pain -     Urinalysis w microscopic + reflex cultur -     hyoscyamine (LEVSIN, ANASPAZ) 0.125 MG tablet; Take 1 tablet (0.125 mg total) by mouth every 6 (six) hours as needed for cramping. -     Cancel: Ambulatory referral to Gastroenterology -     Hepatic function panel  Gastroesophageal reflux disease without esophagitis -     Urinalysis w microscopic + reflex cultur -     hyoscyamine (LEVSIN, ANASPAZ) 0.125 MG tablet; Take 1 tablet (0.125 mg total) by mouth every 6 (six) hours as needed for cramping. -     Cancel: Ambulatory referral to Gastroenterology  Constipation, unspecified constipation type   I have discontinued Zenaya A. Durante's ondansetron, cyclobenzaprine, and lactulose. I am also having her start on hyoscyamine. Additionally, I am having her maintain her ALPRAZolam, acetaminophen, naproxen, ibuprofen, senna-docusate, omeprazole, saccharomyces boulardii, and testosterone cypionate.  Meds ordered this encounter  Medications  . hyoscyamine (LEVSIN, ANASPAZ) 0.125 MG tablet    Sig: Take 1 tablet (0.125 mg total) by mouth every 6 (six) hours as needed for cramping.    Dispense:  30 tablet    Refill:  1    Order Specific Question:   Supervising Provider    Answer:   Dianne Dun [[id number]]    Follow-up: Return in about 8 weeks (around 09/11/2017) for IBS-constipation.  Alysia Penna, NP

## 2017-07-19 LAB — URINALYSIS W MICROSCOPIC + REFLEX CULTURE
BACTERIA UA: NONE SEEN /HPF
BILIRUBIN URINE: NEGATIVE
GLUCOSE, UA: NEGATIVE
HGB URINE DIPSTICK: NEGATIVE
HYALINE CAST: NONE SEEN /LPF
KETONES UR: NEGATIVE
Nitrites, Initial: NEGATIVE
Protein, ur: NEGATIVE
RBC / HPF: NONE SEEN /HPF (ref 0–2)
SPECIFIC GRAVITY, URINE: 1.019 (ref 1.001–1.03)
SQUAMOUS EPITHELIAL / LPF: NONE SEEN /HPF (ref <=5)
pH: 6 (ref 5.0–8.0)

## 2017-07-19 LAB — URINE CULTURE
MICRO NUMBER:: 90577752
SPECIMEN QUALITY:: ADEQUATE

## 2017-07-19 LAB — CULTURE INDICATED

## 2017-10-12 ENCOUNTER — Other Ambulatory Visit: Payer: Self-pay | Admitting: Endocrinology

## 2017-10-13 MED ORDER — TESTOSTERONE CYPIONATE 200 MG/ML IM SOLN: 80.0000 mg | INTRAMUSCULAR | 0 refills | Status: DC

## 2017-10-23 ENCOUNTER — Telehealth: Payer: Self-pay | Admitting: Endocrinology

## 2017-10-23 NOTE — Telephone Encounter (Signed)
testosterone cypionate (DEPOTESTOSTERONE CYPIONATE) 200 MG/ML injection   Patient stated that this medication was suppose to be sent into the pharmacy but the pharmacy is stating that they have no received it     [id number] - [place], [place] - [address]

## 2017-10-26 MED ORDER — TESTOSTERONE CYPIONATE 200 MG/ML IM SOLN: 80.0000 mg | INTRAMUSCULAR | 0 refills | Status: DC

## 2017-10-26 NOTE — Telephone Encounter (Signed)
[facility] & they didn't have prescription from 8/8 only the one from today.

## 2017-10-26 NOTE — Telephone Encounter (Signed)
I have resent. Also, please [facility], and cancel the 10/15/17 refill Thank you.

## 2018-01-26 ENCOUNTER — Other Ambulatory Visit (HOSPITAL_COMMUNITY)
Admission: RE | Admit: 2018-01-26 | Discharge: 2018-01-26 | Disposition: A | Discharge status: Home / Self Care | Payer: PRIVATE HEALTH INSURANCE | Source: Ambulatory Visit | Attending: Nurse Practitioner | Admitting: Nurse Practitioner

## 2018-01-26 ENCOUNTER — Ambulatory Visit (INDEPENDENT_AMBULATORY_CARE_PROVIDER_SITE_OTHER): Payer: No Typology Code available for payment source | Admitting: Nurse Practitioner

## 2018-01-26 ENCOUNTER — Encounter: Payer: Self-pay | Admitting: Nurse Practitioner

## 2018-01-26 VITALS — BP 124/80 | HR 72 | Temp 98.9°F | Ht 62.0 in | Wt 240.2 lb

## 2018-01-26 DIAGNOSIS — Z23 Encounter for immunization: Secondary | ICD-10-CM

## 2018-01-26 DIAGNOSIS — D709 Neutropenia, unspecified: Secondary | ICD-10-CM

## 2018-01-26 DIAGNOSIS — Z124 Encounter for screening for malignant neoplasm of cervix: Secondary | ICD-10-CM | POA: Diagnosis not present

## 2018-01-26 DIAGNOSIS — Z01818 Encounter for other preprocedural examination: Secondary | ICD-10-CM | POA: Diagnosis not present

## 2018-01-26 LAB — CBC WITH DIFFERENTIAL/PLATELET
BASOS PCT: 1.1 % (ref 0.0–3.0)
Basophils Absolute: 0 10*3/uL (ref 0.0–0.1)
EOS PCT: 1.1 % (ref 0.0–5.0)
Eosinophils Absolute: 0 10*3/uL (ref 0.0–0.7)
HEMATOCRIT: 42.8 % (ref 36.0–46.0)
HEMOGLOBIN: 14.3 g/dL (ref 12.0–15.0)
Lymphocytes Relative: 46.8 % — ABNORMAL HIGH (ref 12.0–46.0)
Lymphs Abs: 1.1 10*3/uL (ref 0.7–4.0)
MCHC: 33.5 g/dL (ref 30.0–36.0)
MCV: 88.5 fl (ref 78.0–100.0)
MONOS PCT: 14.3 % — AB (ref 3.0–12.0)
Monocytes Absolute: 0.3 10*3/uL (ref 0.1–1.0)
Neutro Abs: 0.9 10*3/uL — ABNORMAL LOW (ref 1.4–7.7)
Neutrophils Relative %: 36.7 % — ABNORMAL LOW (ref 43.0–77.0)
Platelets: 219 10*3/uL (ref 150.0–400.0)
RBC: 4.84 Mil/uL (ref 3.87–5.11)
RDW: 13.1 % (ref 11.5–15.5)

## 2018-01-26 LAB — COMPREHENSIVE METABOLIC PANEL
ALT: 16 U/L (ref 0–35)
AST: 21 U/L (ref 0–37)
Albumin: 4.3 g/dL (ref 3.5–5.2)
Alkaline Phosphatase: 44 U/L (ref 39–117)
BUN: 7 mg/dL (ref 6–23)
CHLORIDE: 106 meq/L (ref 96–112)
CO2: 23 meq/L (ref 19–32)
CREATININE: 0.88 mg/dL (ref 0.40–1.20)
Calcium: 9.3 mg/dL (ref 8.4–10.5)
GFR: 94.65 mL/min (ref >60.00)
GLUCOSE: 93 mg/dL (ref 70–99)
POTASSIUM: 4.1 meq/L (ref 3.5–5.1)
SODIUM: 137 meq/L (ref 135–145)
Total Bilirubin: 0.4 mg/dL (ref 0.2–1.2)
Total Protein: 7.7 g/dL (ref 6.0–8.3)

## 2018-01-26 LAB — LIPID PANEL
CHOL/HDL RATIO: 4
Cholesterol: 125 mg/dL (ref 0–200)
HDL: 34.8 mg/dL — ABNORMAL LOW (ref >39.00)
LDL CALC: 75 mg/dL (ref 0–99)
NONHDL: 90.27
Triglycerides: 76 mg/dL (ref 0.0–149.0)
VLDL: 15.2 mg/dL (ref 0.0–40.0)

## 2018-01-26 LAB — PROTIME-INR
INR: 1.1 ratio — AB (ref 0.8–1.0)
Prothrombin Time: 12.8 s (ref 9.6–13.1)

## 2018-01-26 NOTE — Progress Notes (Signed)
Subjective:  Patient ID: Madeline Larsen, female    DOB: Dec 18, 1985  Age: 32 y.o. 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 need lab work and paper fill out. tdap?)  HPI  Madeline Larsen is here for surgical clearance. He is scheduled to have bilateral breast tissue removed 02/16/2018 by plastic surgeon in [place]: Dr. Marianna Payment (Plastic Surgeon in [place]). He plans to stay in [place] with wife for 1week post surgery. Plans to have additional f/up with surgeon via video consultation.  no Fhx pf premature CAD. No Fhx of complications from anesthesia.  No menstrual cycle for last 72yrs with use of testostrone. Last PAP done 14yrs ago (normal per patient). Fhx of uterine cancer(mother). pelvic US 2018: normal.  He denies any breast mass or pain or nipple discharge.   Depression screen [facility] 2/9 01/26/2018 07/17/2017 07/02/2017  Decreased Interest 0 0 0  Down, Depressed, Hopeless 0 0 0  PHQ - 2 Score 0 0 0  Altered sleeping 2 - -  Tired, decreased energy 0 - -  Change in appetite 0 - -  Feeling bad or failure about yourself  0 - -  Trouble concentrating 0 - -  Moving slowly or fidgety/restless 0 - -  Suicidal thoughts 0 - -  PHQ-9 Score 2 - -   GAD 7 : Generalized Anxiety Score 01/26/2018  Nervous, Anxious, on Edge 1  Control/stop worrying 1  Worry too much - different things 1  Trouble relaxing 1  Restless 1  Easily annoyed or irritable 0  Afraid - awful might happen 0  Total GAD 7 Score 5    Reviewed past Medical, Social and Family history today.  Outpatient Medications Prior to Visit  Medication Sig Dispense Refill  . acetaminophen (TYLENOL) 500 MG tablet Take 1,000 mg by mouth every 6 (six) hours as needed for pain.    Marland Kitchen ALPRAZolam (XANAX) 1 MG tablet Take 1 mg by mouth at bedtime as needed. for sleep  0  . hyoscyamine (LEVSIN, ANASPAZ) 0.125 MG tablet Take 1 tablet (0.125 mg total) by mouth every 6 (six) hours as needed for  cramping. 30 tablet 1  . ibuprofen (ADVIL,MOTRIN) 200 MG tablet Take by mouth.    . naproxen (NAPROSYN) 500 MG tablet Take 1 tablet (500 mg total) by mouth 2 (two) times daily. 30 tablet 0  . omeprazole (PRILOSEC) 20 MG capsule Take 1 capsule (20 mg total) by mouth daily. 30 capsule 3  . saccharomyces boulardii (FLORASTOR) 250 MG capsule Take 1 capsule (250 mg total) by mouth 2 (two) times daily.    Marland Kitchen senna-docusate (SENOKOT-S) 8.6-50 MG tablet Take 1 tablet by mouth at bedtime. 30 tablet 0  . testosterone cypionate (DEPOTESTOSTERONE CYPIONATE) 200 MG/ML injection Inject 0.4 mLs (80 mg total) into the muscle every Thursday. 10 mL 0   No facility-administered medications prior to visit.     ROS See HPI  Objective:  BP 124/80   Pulse 72   Temp 98.9 F (37.2 C) (Oral)   Ht 5\' 2"  (1.575 m)   Wt 240 lb 3.2 oz (109 kg)   SpO2 100%   BMI 43.93 kg/m   BP Readings from Last 3 Encounters:  01/26/18 124/80  07/17/17 118/70  07/02/17 120/78    Wt Readings from Last 3 Encounters:  01/26/18 240 lb 3.2 oz (109 kg)  07/17/17 223 lb 12.8 oz (101.5 kg)  07/02/17 218 lb 3.2 oz (99 kg)    Physical Exam  Constitutional: She is oriented to person, place, and time. She appears well-developed and well-nourished.  Neck: Normal range of motion. Neck supple. No thyromegaly present.  Cardiovascular: Normal rate, regular rhythm, normal heart sounds and intact distal pulses.  Pulmonary/Chest: Effort normal and breath sounds normal.  Abdominal: Soft. Bowel sounds are normal.  Genitourinary: Vagina normal. Cervix exhibits no motion tenderness, no discharge and no friability. No erythema in the vagina. No vaginal discharge found.  Genitourinary Comments: Chaperone present.  Musculoskeletal: She exhibits no edema.  Lymphadenopathy:    She has no cervical adenopathy.  Neurological: She is alert and oriented to person, place, and time.  Skin: Skin is warm and dry.  Psychiatric: She has a normal mood  and affect. Her behavior is normal. Thought content normal.  Vitals reviewed.  Lab Results  Component Value Date   WBC 2.4 Repeated and verified X2. (L) 01/26/2018   HGB 14.3 01/26/2018   HCT 42.8 01/26/2018   PLT 219.0 01/26/2018   GLUCOSE 93 01/26/2018   CHOL 125 01/26/2018   TRIG 76.0 01/26/2018   HDL 34.80 (L) 01/26/2018   LDLCALC 75 01/26/2018   ALT 16 01/26/2018   AST 21 01/26/2018   NA 137 01/26/2018   K 4.1 01/26/2018   CL 106 01/26/2018   CREATININE 0.88 01/26/2018   BUN 7 01/26/2018   CO2 23 01/26/2018   TSH 0.60 06/18/2017   INR 1.1 (H) 01/26/2018    Dg Cervical Spine Complete  Result Date: 04/12/2017 CLINICAL DATA:  Left neck pain/numbness EXAM: CERVICAL SPINE - COMPLETE 4+ VIEW COMPARISON:  None. FINDINGS: Normal cervical lordosis. No evidence of fracture or dislocation. Vertebral body heights and intervertebral disc spaces are maintained. Dens appears intact.  Lateral masses of C1 are symmetric. Prevertebral soft tissues are within normal limits. Right neural foramina are patent. Left neural foramina are likely patent, but are partially obscured due to obliquity. Visualized lung apices are clear. IMPRESSION: Negative cervical spine radiographs. Electronically Signed   By: Charline BillsSriyesh  Krishnan M.D.   On: 04/12/2017 07:48    ECG: Sinus Bradycardia, no change from previous ECG done 04/2017.  Assessment & Plan:   Madeline PilotKaisin was seen today for follow-up.  Diagnoses and all orders for this visit:  Pre-operative clearance -     CBC w/Diff -     Lipid panel -     Comprehensive metabolic panel -     EKG 12-Lead -     Protime-INR  Cervical cancer screening -     Cytology - PAP  Need for diphtheria-tetanus-pertussis (Tdap) vaccine -     Tdap vaccine greater than or equal to 7yo IM  Neutropenia, unspecified type (HCC) -     HIV antibody (with reflex); Future -     B12 and Folate Panel; Future -     CBC (INCLUDES DIFF/PLT) WITH PATHOLOGIST REVIEW; Future   I am  having Madeline Larsen "Madeline Larsen" maintain her ALPRAZolam, acetaminophen, naproxen, ibuprofen, senna-docusate, omeprazole, saccharomyces boulardii, hyoscyamine, and testosterone cypionate.  No orders of the defined types were placed in this encounter.   Problem List Items Addressed This Visit    None    Visit Diagnoses    Pre-operative clearance    -  Primary   Relevant Orders   CBC w/Diff (Completed)   Lipid panel (Completed)   Comprehensive metabolic panel (Completed)   EKG 12-Lead (Completed)   Protime-INR (Completed)   Cervical cancer screening       Relevant Orders   Cytology -  PAP   Need for diphtheria-tetanus-pertussis (Tdap) vaccine       Relevant Orders   Tdap vaccine greater than or equal to 7yo IM (Completed)   Neutropenia, unspecified type (HCC)       Relevant Orders   HIV antibody (with reflex)   B12 and Folate Panel   CBC (INCLUDES DIFF/PLT) WITH PATHOLOGIST REVIEW       Follow-up: Return if symptoms worsen or fail to improve.  Alysia Penna, NP

## 2018-01-26 NOTE — Patient Instructions (Addendum)
  ECG: Sinus Bradycardia, no change from previous ECG done 04/2017.  Your form will be completed once I review lab results.  Lipid panel normal except need to increase exercise to improve HDL. Normal CMP, PT/INR and TSH. CBC indicates persistent low WBC/neutropenia. Normal platelet count and clotting time. Need to do additional labs to determine cause. He needs to return to lab for additional blood draw.

## 2018-01-27 ENCOUNTER — Encounter: Payer: Self-pay | Admitting: Nurse Practitioner

## 2018-01-27 ENCOUNTER — Telehealth: Payer: Self-pay | Admitting: Nurse Practitioner

## 2018-01-27 DIAGNOSIS — D709 Neutropenia, unspecified: Secondary | ICD-10-CM | POA: Insufficient documentation

## 2018-01-27 LAB — CYTOLOGY - PAP
Diagnosis: NEGATIVE
HPV: NOT DETECTED

## 2018-01-27 NOTE — Telephone Encounter (Signed)
Noted  

## 2018-01-27 NOTE — Telephone Encounter (Signed)
Copied from CRM [phone]. Topic: Quick Communication - See Telephone Encounter >> Jan 27, 2018 11:46 AM Trula SladeWalter, Linda F wrote: CRM for notification. See Telephone encounter for: 01/27/18. Patient was in to see the provider yesterday and brought a surgical clearance form to be filled out.  She wants the provider to know that the hospital will only accept the form if it's signed by a MD.

## 2018-01-28 ENCOUNTER — Other Ambulatory Visit (INDEPENDENT_AMBULATORY_CARE_PROVIDER_SITE_OTHER): Payer: No Typology Code available for payment source

## 2018-01-28 DIAGNOSIS — D709 Neutropenia, unspecified: Secondary | ICD-10-CM

## 2018-01-28 NOTE — Telephone Encounter (Signed)
Form completed,faxed. Left detail message inform the pt.

## 2018-01-29 LAB — CBC (INCLUDES DIFF/PLT) WITH PATHOLOGIST REVIEW
Basophils Absolute: 19 cells/uL (ref 0–200)
Basophils Relative: 0.6 %
EOS PCT: 0.9 %
Eosinophils Absolute: 29 cells/uL (ref 15–500)
HEMATOCRIT: 41.1 % (ref 35.0–45.0)
Hemoglobin: 13.6 g/dL (ref 11.7–15.5)
Lymphs Abs: 1184 cells/uL (ref 850–3900)
MCH: 28.9 pg (ref 27.0–33.0)
MCHC: 33.1 g/dL (ref 32.0–36.0)
MCV: 87.3 fL (ref 80.0–100.0)
MONOS PCT: 11.1 %
MPV: 11.2 fL (ref 7.5–12.5)
NEUTROS PCT: 50.4 %
Neutro Abs: 1613 cells/uL (ref 1500–7800)
Platelets: 237 10*3/uL (ref 140–400)
RBC: 4.71 10*6/uL (ref 3.80–5.10)
RDW: 12.3 % (ref 11.0–15.0)
TOTAL LYMPHOCYTE: 37 %
WBC mixed population: 355 cells/uL (ref 200–950)
WBC: 3.2 10*3/uL — ABNORMAL LOW (ref 3.8–10.8)

## 2018-01-29 LAB — B12 AND FOLATE PANEL
FOLATE: 10.3 ng/mL
Vitamin B-12: 471 pg/mL (ref 200–1100)

## 2018-01-29 LAB — HIV ANTIBODY (ROUTINE TESTING W REFLEX): HIV: NONREACTIVE

## 2018-02-05 ENCOUNTER — Encounter: Payer: Self-pay | Admitting: Nurse Practitioner

## 2018-02-07 HISTORY — PX: OTHER SURGICAL HISTORY: SHX169

## 2018-02-28 ENCOUNTER — Other Ambulatory Visit: Payer: Self-pay | Admitting: Endocrinology

## 2018-03-01 MED ORDER — TESTOSTERONE CYPIONATE 200 MG/ML IM SOLN: 80.0000 mg | INTRAMUSCULAR | 0 refills | Status: DC

## 2018-03-01 NOTE — Telephone Encounter (Signed)
Ok, I have sent a prescription to your pharmacy, to refill 

## 2018-03-01 NOTE — Telephone Encounter (Signed)
Please refill if appropriate

## 2018-05-02 ENCOUNTER — Encounter: Payer: Self-pay | Admitting: Nurse Practitioner

## 2018-05-02 DIAGNOSIS — K5900 Constipation, unspecified: Secondary | ICD-10-CM

## 2018-05-03 MED ORDER — LACTULOSE 10 GM/15ML PO SOLN: 20.0000 g | Freq: Every day | ORAL | 0 refills | Status: DC | PRN

## 2018-05-25 ENCOUNTER — Other Ambulatory Visit: Payer: Self-pay | Admitting: Endocrinology

## 2018-05-25 ENCOUNTER — Encounter: Payer: Self-pay | Admitting: Endocrinology

## 2018-05-25 NOTE — Telephone Encounter (Signed)
Please advise 

## 2018-05-25 NOTE — Telephone Encounter (Signed)
Not due yet

## 2018-05-25 NOTE — Telephone Encounter (Signed)
Please refill if appropriate

## 2018-06-21 ENCOUNTER — Ambulatory Visit: Payer: PRIVATE HEALTH INSURANCE | Admitting: Endocrinology

## 2018-07-05 ENCOUNTER — Encounter: Payer: Self-pay | Admitting: Nurse Practitioner

## 2018-07-06 ENCOUNTER — Encounter (HOSPITAL_COMMUNITY): Payer: Self-pay

## 2018-07-06 ENCOUNTER — Encounter: Payer: Self-pay | Admitting: Nurse Practitioner

## 2018-07-06 ENCOUNTER — Ambulatory Visit (INDEPENDENT_AMBULATORY_CARE_PROVIDER_SITE_OTHER): Payer: No Typology Code available for payment source | Admitting: Nurse Practitioner

## 2018-07-06 ENCOUNTER — Other Ambulatory Visit: Payer: Self-pay

## 2018-07-06 ENCOUNTER — Ambulatory Visit (HOSPITAL_COMMUNITY)
Admission: EM | Admit: 2018-07-06 | Discharge: 2018-07-06 | Disposition: A | Discharge status: Home / Self Care | Payer: PRIVATE HEALTH INSURANCE | Attending: Family Medicine | Admitting: Family Medicine

## 2018-07-06 VITALS — BP 132/80 | Temp 97.8°F | Ht 62.0 in

## 2018-07-06 DIAGNOSIS — R5383 Other fatigue: Secondary | ICD-10-CM

## 2018-07-06 DIAGNOSIS — R0789 Other chest pain: Secondary | ICD-10-CM

## 2018-07-06 DIAGNOSIS — R5381 Other malaise: Secondary | ICD-10-CM

## 2018-07-06 DIAGNOSIS — R531 Weakness: Secondary | ICD-10-CM

## 2018-07-06 DIAGNOSIS — R768 Other specified abnormal immunological findings in serum: Secondary | ICD-10-CM | POA: Diagnosis present

## 2018-07-06 LAB — SEDIMENTATION RATE: Sed Rate: 8 mm/hr (ref 0–22)

## 2018-07-06 MED ORDER — METHYLPREDNISOLONE 4 MG PO TBPK: ORAL_TABLET | ORAL | 0 refills | Status: DC

## 2018-07-06 NOTE — ED Notes (Signed)
Pt also states that was seen last year to be tested for lupus

## 2018-07-06 NOTE — ED Triage Notes (Signed)
Pt states has increased fatigue for past week, describes having chest tightness that is worse when lying down, here today because symptoms worse for past 2 days

## 2018-07-06 NOTE — ED Provider Notes (Signed)
[facility]    CSN: [id number] Arrival date & time: 07/06/18  1110     History   Chief Complaint Chief Complaint  Patient presents with  . Weakness    HPI Madeline Larsen is a 33 y.o. adult.   HPI  records are reviewed He had an E visit with PCP this morning and was advised to come for exam Has vague symptoms of achiness, weakness, chest tightness, feeling SOB when lies down for a week. Worse the last 2 d.  No known exposure to illness.  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  Checks temp daily and has not had fever, chills.  No cough or sputum.  NO sore throat, runny nose, sinus or ear pain.  No NVD or change bowels.  Not eating and drinking as much.  No joint pain or rash. Went to his orthopedic who reminded him that he is ANA positive on prior testing that was never followed up.  Has a lot of auto immune disease in family.    Past Medical History:  Diagnosis Date  . Depression   . Female-to-female transgender person   . GERD (gastroesophageal reflux disease)   . MRSA (methicillin resistant staph aureus) culture positive     Patient Active Problem List   Diagnosis Date Noted  . Neutropenia (HCC) 01/27/2018  . 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 Headache 06/18/2017  . 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 Status post left foot surgery 04/09/2015  . 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 FOOT SURGERY Left     OB History   No obstetric history on file.      Home Medications    Prior to Admission medications   Medication Sig Start Date End Date Taking? Authorizing Provider  acetaminophen (TYLENOL) 500 MG tablet Take 1,000 mg by mouth every 6 (six) hours as needed for pain.    [provider]  ALPRAZolam Prudy Feeler(XANAX) 1 MG tablet Take 1 mg by mouth at bedtime as needed. for sleep 02/03/17   [provider]  hyoscyamine (LEVSIN, ANASPAZ) 0.125 MG tablet Take 1 tablet (0.125 mg  total) by mouth every 6 (six) hours as needed for cramping. 07/17/17   Nche, Bonna Gainsharlotte Lum, NP  ibuprofen (ADVIL,MOTRIN) 200 MG tablet Take by mouth.    [provider]  lactulose (CHRONULAC) 10 GM/15ML solution Take 30 mLs (20 g total) by mouth daily as needed for severe constipation. 05/03/18   Anne NgNche, Charlotte Lum, NP  methylPREDNISolone (MEDROL DOSEPAK) 4 MG TBPK tablet tad 07/06/18   Eustace MooreNelson, Shana Zavaleta Sue, MD  omeprazole (PRILOSEC) 20 MG capsule Take 1 capsule (20 mg total) by mouth daily. 07/02/17   Nche, Bonna Gainsharlotte Lum, NP  saccharomyces boulardii (FLORASTOR) 250 MG capsule Take 1 capsule (250 mg total) by mouth 2 (two) times daily. 07/02/17   Nche, Bonna Gainsharlotte Lum, NP  senna-docusate (SENOKOT-S) 8.6-50 MG tablet Take 1 tablet by mouth at bedtime. 07/02/17   Nche, Bonna Gainsharlotte Lum, NP  testosterone cypionate (DEPOTESTOSTERONE CYPIONATE) 200 MG/ML injection Inject 0.4 mLs (80 mg total) into the muscle every Thursday. 03/04/18   Romero BellingEllison, Sean, MD    Family History Family History  Problem Relation Age of Onset  . Alcohol abuse Mother   . Cancer Mother [id number]       Uterine cancer  . Drug abuse Mother   . HIV/AIDS Mother   . Early death Mother 4338  uterine cancer  . Alcohol abuse Father   . Drug abuse Father   . Early death Father 9       muscular dystrophy  . Muscular dystrophy Father   . Alcohol abuse Sister   . Depression Sister   . Drug abuse Sister   . Depression Brother   . Kidney disease Maternal Grandmother        ESRD on dialysis  . Hypertension Maternal Grandmother     Social History Social History   Tobacco Use  . Smoking status: Never Smoker  . Smokeless tobacco: Never Used  Substance Use Topics  . Alcohol use: Yes    Alcohol/week: 0.0 standard drinks    Comment: socially  . Drug use: Yes    Types: Marijuana     Allergies   Patient has no known allergies.   Review of Systems Review of Systems  Constitutional: Positive for activity change, appetite change  and fatigue. Negative for chills and fever.  HENT: Negative for congestion, ear pain, postnasal drip, rhinorrhea and sore throat.   Eyes: Negative for pain and visual disturbance.  Respiratory: Positive for chest tightness. Negative for cough and shortness of breath.   Cardiovascular: Negative for chest pain and palpitations.  Gastrointestinal: Negative for abdominal pain, diarrhea, nausea and vomiting.  Genitourinary: Negative for dysuria and hematuria.  Musculoskeletal: Positive for myalgias. Negative for arthralgias and back pain.  Skin: Negative for color change and rash.  Neurological: Negative for seizures, syncope and headaches.  All other systems reviewed and are negative.    Physical Exam Triage Vital Signs ED Triage Vitals  Enc Vitals Group     BP 07/06/18 1126 (!) 145/95     Pulse Rate 07/06/18 1126 64     Resp 07/06/18 1126 16     Temp 07/06/18 1126 98 F (36.7 C)     Temp Source 07/06/18 1126 Oral     SpO2 07/06/18 1126 100 %     Weight --      Height --      Head Circumference --      Peak Flow --      Pain Score 07/06/18 1127 2     Pain Loc --      Pain Edu? --      Excl. in GC? --    No data found.  Updated Vital Signs BP (!) 145/95 (BP Location: Left Arm)   Pulse 64   Temp 98 F (36.7 C) (Oral)   Resp 16   SpO2 100%      Physical Exam Constitutional:      General: He is not in acute distress.    Appearance: He is well-developed. He is obese. He is ill-appearing.     Comments: Appears tired  HENT:     Head: Normocephalic and atraumatic.     Right Ear: Tympanic membrane, ear canal and external ear normal.     Left Ear: Tympanic membrane, ear canal and external ear normal.     Nose: Nose normal. No congestion.     Mouth/Throat:     Mouth: Mucous membranes are moist.     Pharynx: No posterior oropharyngeal erythema.  Eyes:     Conjunctiva/sclera: Conjunctivae normal.     Pupils: Pupils are equal, round, and reactive to light.  Neck:      Musculoskeletal: Normal range of motion.  Cardiovascular:     Rate and Rhythm: Normal rate and regular rhythm.     Heart sounds: Normal heart  sounds.  Pulmonary:     Effort: Pulmonary effort is normal. No respiratory distress.     Breath sounds: Normal breath sounds.  Abdominal:     General: Abdomen is flat. There is no distension.     Palpations: Abdomen is soft.     Tenderness: There is no abdominal tenderness.  Musculoskeletal: Normal range of motion.     Right lower leg: No edema.     Left lower leg: No edema.  Lymphadenopathy:     Cervical: No cervical adenopathy.  Skin:    General: Skin is warm and dry.  Neurological:     Mental Status: He is alert.  Psychiatric:        Mood and Affect: Mood normal.        Behavior: Behavior normal.      UC Treatments / Results  Labs (all labs ordered are listed, but only abnormal results are displayed) Labs Reviewed  SEDIMENTATION RATE  ANTINUCLEAR ANTIBODIES, IFA  LUPUS ANTICOAGULANT PANEL    EKG None  Radiology No results found.  Procedures Procedures (including critical care time)  Medications Ordered in UC Medications - No data to display  Initial Impression / Assessment and Plan / UC Course  I have reviewed the triage vital signs and the nursing notes.  Pertinent labs & imaging results that were available during my care of the patient were reviewed by me and considered in my medical decision making (see chart for details).     Discussed that I do not believe she has COVID 19.  No symptoms or PE findings to suggest.  IF she has lupus this could be a flare.  Will do testing an give her trial of pred.  Follow up with PCP. Final Clinical Impressions(s) / UC Diagnoses   Final diagnoses:  ANA positive  Generalized weakness     Discharge Instructions     I am doing lab tests to check your ANA and lupus titers.  We will notify you of your test results.  These will not be available for several days Home to rest.   Push fluids Take the Medrol pack as directed.  Take all of day 1 today. Follow-up with your primary care doctor.   ED Prescriptions    Medication Sig Dispense Auth. Provider   methylPREDNISolone (MEDROL DOSEPAK) 4 MG TBPK tablet tad 21 tablet Eustace Moore, MD     Controlled Substance Prescriptions [place] Controlled Substance Registry consulted? Not Applicable   Eustace Moore, MD 07/06/18 1221

## 2018-07-06 NOTE — Discharge Instructions (Addendum)
I am doing lab tests to check your ANA and lupus titers.  We will notify you of your test results.  These will not be available for several days Home to rest.  Push fluids Take the Medrol pack as directed.  Take all of day 1 today. Follow-up with your primary care doctor.

## 2018-07-06 NOTE — Progress Notes (Signed)
Virtual Visit via Video Note  I connected with Madailein A Lau on 07/06/18 at 10:30 AM EDT by a video enabled telemedicine application and verified that I am speaking with the correct person using two identifiers.   I discussed the limitations of evaluation and management by telemedicine and the availability of in person appointments. The patient expressed understanding and agreed to proceed.   Provider:[facility] Patient: Madeline Larsen.  CC: pt is complaining of weakness,tightness in chest area when lay down,tired,headache on and off,constipations/going on 1 wk but getting worse about 2 days/ tylenol and mucinex otc. FYI--vital sign from last night  History of Present Illness: Chest Pain   This is a new problem. The current episode started in the past 7 days. The onset quality is gradual. The problem occurs constantly. The problem has been unchanged. The pain is present in the substernal region. The quality of the pain is described as tightness. The pain does not radiate. Associated symptoms include headaches, malaise/fatigue and orthopnea. Pertinent negatives include no back pain, claudication, cough, diaphoresis, exertional chest pressure, fever, irregular heartbeat, nausea, palpitations, PND, shortness of breath, sputum production, syncope or weakness. Risk factors include lack of exercise, obesity and sedentary lifestyle.   Reviewed medication list with patient.  Observations/Objective: Physical Exam  Constitutional: He is oriented to person, place, and time. No distress.  Appear somnolent during video call  Pulmonary/Chest: Effort normal.  Neurological: He is alert and oriented to person, place, and time.  Psychiatric: He has a normal mood and affect. Thought content normal.  Vitals reviewed.  Assessment and Plan: Yassmin was seen today for fatigue.  Diagnoses and all orders for this visit:  Feeling of chest tightness  Malaise and fatigue   Follow Up Instructions: Advised to go to  urgent for additional evaluation. He is not to drive. He is to call 911 or have a friend or family member take him to urgent care clinic. He agreed to contact his sister to transport him to [facility] today.   I discussed the assessment and treatment plan with the patient. The patient was provided an opportunity to ask questions and all were answered. The patient agreed with the plan and demonstrated an understanding of the instructions.   The patient was advised to call back or seek an in-person evaluation if the symptoms worsen or if the condition fails to improve as anticipated.  Alysia Penna, NP

## 2018-07-07 ENCOUNTER — Encounter: Payer: Self-pay | Admitting: Nurse Practitioner

## 2018-07-07 LAB — ANTINUCLEAR ANTIBODIES, IFA: ANA Ab, IFA: POSITIVE — AB

## 2018-07-07 LAB — FANA STAINING PATTERNS: Homogeneous Pattern: 1:1280 {titer}

## 2018-07-07 LAB — LUPUS ANTICOAGULANT PANEL
DRVVT: 35.9 s (ref 0.0–47.0)
PTT Lupus Anticoagulant: 34.1 s (ref 0.0–51.9)

## 2018-07-09 ENCOUNTER — Ambulatory Visit (INDEPENDENT_AMBULATORY_CARE_PROVIDER_SITE_OTHER): Payer: No Typology Code available for payment source | Admitting: Endocrinology

## 2018-07-09 ENCOUNTER — Other Ambulatory Visit: Payer: Self-pay

## 2018-07-09 DIAGNOSIS — F640 Transsexualism: Secondary | ICD-10-CM | POA: Diagnosis not present

## 2018-07-09 DIAGNOSIS — Z789 Other specified health status: Secondary | ICD-10-CM

## 2018-07-09 NOTE — Progress Notes (Signed)
Subjective:    Patient ID: Madeline Larsen, adult    DOB: 10-Jul-1985, 33 y.o.   MRN: [id number]  HPI  telehealth visit today via doxy video visit.  Alternatives to telehealth are presented to this patient, and the patient agrees to the telehealth visit. Pt is advised of the cost of the visit, and agrees to this, also.   Patient is at home, and I am at the office.   Persons attending the telehealth visit: the patient and I Pt returns for f/u of transgender state (F to M).  Pt reports he first suspected the transgender state at age 15.  He had puberty at the age of 18.  he has been to counseling in the past.  He has no children.  He has no h/o DVT, dyslipidemia, liver disease, kidney disease, migraine, sleep apnea, heart disease, CVA, acne, diabetes, smoking, HTN, epilepsy, violent behavior, gallbladder disease, blood disorders, osteoporosis, or cancer.  He has been on testosterone rx since 2015.  He had bilat mastectomies (Dr Festus Barren, in [place], [place]) in 2019.  He takes testosterone as rx'ed.  Last dose of testosterone was yesterday.  He has slight hair loss on the head, and assoc swelling of the legs.  He has good facial and body hair growth.  Voice is deepening.   Past Medical History:  Diagnosis Date  . Depression   . Female-to-female transgender person   . GERD (gastroesophageal reflux disease)   . MRSA (methicillin resistant staph aureus) culture positive     Past Surgical History:  Procedure Laterality Date  . FOOT SURGERY Left     Social History   Socioeconomic History  . Marital status: Married    Spouse name: Not on file  . Number of children: Not on file  . Years of education: Not on file  . Highest education level: Not on file  Occupational History  . Not on file  Social Needs  . Financial resource strain: Not on file  . Food insecurity:    Worry: Not on file    Inability: Not on file  . Transportation needs:    Medical: Not on file    Non-medical: Not on file   Tobacco Use  . Smoking status: Never Smoker  . Smokeless tobacco: Never Used  Substance and Sexual Activity  . Alcohol use: Yes    Alcohol/week: 0.0 standard drinks    Comment: socially  . Drug use: Yes    Types: Marijuana  . Sexual activity: Yes    Birth control/protection: None    Comment: female partner  Lifestyle  . Physical activity:    Days per week: Not on file    Minutes per session: Not on file  . Stress: Not on file  Relationships  . Social connections:    Talks on phone: Not on file    Gets together: Not on file    Attends religious service: Not on file    Active member of club or organization: Not on file    Attends meetings of clubs or organizations: Not on file    Relationship status: Not on file  . Intimate partner violence:    Fear of current or ex partner: Not on file    Emotionally abused: Not on file    Physically abused: Not on file    Forced sexual activity: Not on file  Other Topics Concern  . Not on file  Social History Narrative  . Not on file    Current Outpatient  Medications on File Prior to Visit  Medication Sig Dispense Refill  . acetaminophen (TYLENOL) 500 MG tablet Take 1,000 mg by mouth every 6 (six) hours as needed for pain.    Marland Kitchen. ALPRAZolam (XANAX) 1 MG tablet Take 1 mg by mouth at bedtime as needed. for sleep  0  . hyoscyamine (LEVSIN, ANASPAZ) 0.125 MG tablet Take 1 tablet (0.125 mg total) by mouth every 6 (six) hours as needed for cramping. 30 tablet 1  . ibuprofen (ADVIL,MOTRIN) 200 MG tablet Take by mouth.    . lactulose (CHRONULAC) 10 GM/15ML solution Take 30 mLs (20 g total) by mouth daily as needed for severe constipation. 236 mL 0  . methylPREDNISolone (MEDROL DOSEPAK) 4 MG TBPK tablet tad 21 tablet 0  . omeprazole (PRILOSEC) 20 MG capsule Take 1 capsule (20 mg total) by mouth daily. 30 capsule 3  . saccharomyces boulardii (FLORASTOR) 250 MG capsule Take 1 capsule (250 mg total) by mouth 2 (two) times daily.    Marland Kitchen. senna-docusate  (SENOKOT-S) 8.6-50 MG tablet Take 1 tablet by mouth at bedtime. 30 tablet 0  . testosterone cypionate (DEPOTESTOSTERONE CYPIONATE) 200 MG/ML injection Inject 0.4 mLs (80 mg total) into the muscle every Thursday. 10 mL 0   No current facility-administered medications on file prior to visit.     No Known Allergies  Family History  Problem Relation Age of Onset  . Alcohol abuse Mother   . Cancer Mother [id number]       Uterine cancer  . Drug abuse Mother   . HIV/AIDS Mother   . Early death Mother 6938       uterine cancer  . Alcohol abuse Father   . Drug abuse Father   . Early death Father 7040       muscular dystrophy  . Muscular dystrophy Father   . Alcohol abuse Sister   . Depression Sister   . Drug abuse Sister   . Depression Brother   . Kidney disease Maternal Grandmother        ESRD on dialysis  . Hypertension Maternal Grandmother     Review of Systems denies mood swings.  He has minimal clitoral growth.      Objective:   Physical Exam  Lab Results  Component Value Date   WBC 3.2 (L) 01/28/2018   HGB 13.6 01/28/2018   HCT 41.1 01/28/2018   MCV 87.3 01/28/2018   PLT 237 01/28/2018      Assessment & Plan:  transgender state: due for recheck of labs. Edema: persistent. uncertain etiology.   Blood tests are requested for you today.  We'll let you know about the results.  Please come back for a follow-up appointment in 1 year.

## 2018-07-15 ENCOUNTER — Other Ambulatory Visit (INDEPENDENT_AMBULATORY_CARE_PROVIDER_SITE_OTHER): Payer: No Typology Code available for payment source

## 2018-07-15 ENCOUNTER — Other Ambulatory Visit: Payer: Self-pay

## 2018-07-15 DIAGNOSIS — Z789 Other specified health status: Secondary | ICD-10-CM

## 2018-07-15 DIAGNOSIS — F640 Transsexualism: Secondary | ICD-10-CM | POA: Diagnosis not present

## 2018-07-15 LAB — TSH: TSH: 0.53 u[IU]/mL (ref 0.35–4.50)

## 2018-07-16 ENCOUNTER — Encounter: Payer: Self-pay | Admitting: Endocrinology

## 2018-07-16 LAB — TESTOSTERONE,FREE AND TOTAL
Testosterone, Free: 6.2 pg/mL — ABNORMAL HIGH (ref 0.0–4.2)
Testosterone: 227 ng/dL — ABNORMAL HIGH (ref 8–48)

## 2018-07-19 NOTE — Telephone Encounter (Signed)
Please advise 

## 2018-07-21 ENCOUNTER — Encounter: Payer: Self-pay | Admitting: Nurse Practitioner

## 2018-07-22 ENCOUNTER — Encounter: Payer: Self-pay | Admitting: Endocrinology

## 2018-07-22 LAB — ESTRADIOL, FREE
Estradiol, Free: 1.63 pg/mL
Estradiol: 66 pg/mL

## 2018-07-23 ENCOUNTER — Other Ambulatory Visit: Payer: Self-pay | Admitting: Endocrinology

## 2018-07-23 DIAGNOSIS — F640 Transsexualism: Secondary | ICD-10-CM

## 2018-07-23 DIAGNOSIS — Z789 Other specified health status: Secondary | ICD-10-CM

## 2018-07-23 NOTE — Telephone Encounter (Signed)
Please advise. I assume this would need to come from PCP?

## 2018-07-26 ENCOUNTER — Encounter: Payer: Self-pay | Admitting: Nurse Practitioner

## 2018-07-26 DIAGNOSIS — R768 Other specified abnormal immunological findings in serum: Secondary | ICD-10-CM

## 2018-07-26 DIAGNOSIS — R5381 Other malaise: Secondary | ICD-10-CM

## 2018-08-03 ENCOUNTER — Encounter: Payer: Self-pay | Admitting: Nurse Practitioner

## 2018-08-18 NOTE — Progress Notes (Signed)
Office Visit Note  Patient: Madeline Larsen             Date of Birth: [birth year]           MRN: [id number]             PCP: Flossie Buffy, NP Referring: Flossie Buffy, NP Visit Date: 09/01/2018 Occupation: Transportation for rehab facility  Subjective:  Pain in multiple joints and positive ANA.   History of Present Illness: Madeline Larsen is a 33 y.o. adult seen in consultation per request of his PCP.  According to patient he has had pain in multiple joints over last several years.  He recalls having in 2016 bilateral knee joint effusion requiring aspiration.  He complains of pain and swelling in his bilateral knee joints, bilateral ankles and his bilateral wrist joints.  He also has discomfort in his bilateral hip joints.  He states his symptoms have gotten worse in the last 1-1/2 years with increased pain and difficulty with mobility.  He denies any discomfort in his cervical or lumbar spine.  He states about a month ago he was seen at the urgent care for tightness in his chest and swelling in his knees.  At that time he was given prednisone taper.  He did not notice any help with the prednisone taper.  There is positive family history of lupus and 3 paternal first cousins.  There is no history of oral ulcers nasal ulcers malar rash or raynauds phenomenon photosensitivity.  Activities of Daily Living:  Patient reports morning stiffness for 24 hours.   Patient Reports nocturnal pain.  Difficulty dressing/grooming: Reports Difficulty climbing stairs: Reports Difficulty getting out of chair: Reports Difficulty using hands for taps, buttons, cutlery, and/or writing: Denies  Review of Systems  Constitutional: Positive for fatigue. Negative for night sweats.  HENT: Positive for mouth dryness. Negative for mouth sores and nose dryness.   Eyes: Negative for redness, itching and dryness.  Respiratory: Negative for shortness of breath, wheezing and difficulty breathing.    Cardiovascular: Negative for chest pain, palpitations, hypertension, irregular heartbeat and swelling in legs/feet.  Gastrointestinal: Positive for constipation. Negative for blood in stool and diarrhea.  Endocrine: Negative for increased urination.  Genitourinary: Negative for painful urination and pelvic pain.  Musculoskeletal: Positive for arthralgias, joint pain, joint swelling, morning stiffness and muscle tenderness. Negative for myalgias, muscle weakness and myalgias.  Skin: Negative for color change, rash, hair loss, nodules/bumps, redness, skin tightness, ulcers and sensitivity to sunlight.  Allergic/Immunologic: Negative for susceptible to infections.  Neurological: Positive for headaches and weakness. Negative for dizziness, fainting, light-headedness, memory loss and night sweats.  Hematological: Negative for bruising/bleeding tendency and swollen glands.  Psychiatric/Behavioral: Positive for sleep disturbance. Negative for depressed mood and confusion. The patient is nervous/anxious.     PMFS History:  Patient Active Problem List   Diagnosis Date Noted  . Neutropenia ([place]) 01/27/2018  . 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 Headache 06/18/2017  . 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 Status post left foot surgery 04/09/2015  . 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 Depression   . Female-to-female transgender person   . GERD (gastroesophageal reflux disease)   . MRSA (methicillin resistant staph aureus) culture positive     Family History  Problem Relation Age of Onset  . Alcohol abuse Mother   . Cancer Mother 3       Uterine cancer  . Drug  abuse Mother   . HIV/AIDS Mother   . Early death Mother 49       uterine cancer  . Alcohol abuse Father   . Drug abuse Father   . Early death Father 63       muscular dystrophy  . Muscular dystrophy Father   . Alcohol abuse Sister   . Depression Sister   . Drug abuse  Sister   . Depression Brother   . Kidney disease Maternal Grandmother        ESRD on dialysis  . Hypertension Maternal Grandmother   . Rheum arthritis Paternal Grandmother   . Rheum arthritis Brother   . Depression Sister   . Depression Sister   . Depression Sister   . Lupus Cousin   . Lupus Cousin   . Lupus Cousin    Past Surgical History:  Procedure Laterality Date  . FOOT SURGERY Left   . top surgery  02/2018   Social History   Social History Narrative  . Not on file   Immunization History  Administered Date(s) Administered  . Tdap 01/26/2018     Objective: Vital Signs: BP (!) 159/102 (BP Location: Right Arm, Patient Position: Sitting, Cuff Size: Large)   Pulse (!) 58   Resp 14   Ht 5' 2.5" (1.588 m)   Wt 253 lb (114.8 kg)   BMI 45.54 kg/m    Physical Exam Vitals signs and nursing note reviewed.  Constitutional:      Appearance: He is well-developed.  HENT:     Head: Normocephalic and atraumatic.  Eyes:     Conjunctiva/sclera: Conjunctivae normal.     Pupils: Pupils are equal, round, and reactive to light.  Neck:     Musculoskeletal: Normal range of motion and neck supple.  Cardiovascular:     Rate and Rhythm: Normal rate and regular rhythm.     Heart sounds: Normal heart sounds.  Pulmonary:     Effort: Pulmonary effort is normal.     Breath sounds: Normal breath sounds.  Abdominal:     General: Bowel sounds are normal.     Palpations: Abdomen is soft.  Skin:    General: Skin is warm and dry.     Capillary Refill: Capillary refill takes less than 2 seconds.  Neurological:     Mental Status: He is alert and oriented to person, place, and time.  Psychiatric:        Behavior: Behavior normal.      Musculoskeletal Exam: C-spine thoracic and lumbar spine good range of motion.  He had good range of motion of his shoulders elbow joints wrist joint MCPs and PIPs.  He has tenderness on palpation of bilateral wrist joints with no synovitis.  He had painful  range of motion of bilateral hip joints.  He is not limited extension of bilateral knee joints without any effusion or warmth on palpation.  He had no tenderness or swelling over ankle joints.  He has some tenderness across MTPs but no swelling was noted.  CDAI Exam: CDAI Score: - Patient Global: -; Provider Global: - Swollen: -; Tender: - Joint Exam   No joint exam has been documented for this visit   There is currently no information documented on the homunculus. Go to the Rheumatology activity and complete the homunculus joint exam.  Investigation: No additional findings.  Imaging: Xr Hips Bilat W Or W/o Pelvis 3-4 Views  Result Date: 09/01/2018 No significant hip joint narrowing was noted.  No SI joint  changes were noted.  No chondrocalcinosis was noted. Impression: Unremarkable x-ray of the hip joints.  Xr Hand 2 View Left  Result Date: 09/01/2018 Minimal PIP narrowing was noted.  No MCP intercarpal radiocarpal joint space narrowing was noted.  No erosive changes were noted. Impression: Unremarkable x-ray of the left hand.  Xr Hand 2 View Right  Result Date: 09/01/2018 No MCP intercarpal radiocarpal joint space narrowing was noted.  No erosive changes were noted.  Minimal PIP narrowing was noted. Impression: Unremarkable x-ray of the hand.  Xr Knee 3 View Left  Result Date: 09/01/2018 Moderate patellofemoral narrowing was noted.  Moderate medial compartment narrowing was noted.  No chondrocalcinosis was noted. Impression: These findings are consistent with moderate osteoarthritis and moderate chondromalacia patella.  Xr Knee 3 View Right  Result Date: 09/01/2018 Moderate medial compartment narrowing with medial osteophyte was noted.  No chondrocalcinosis was noted.  Moderate patellofemoral narrowing was noted. Impression: These findings are consistent moderate osteoarthritis and moderate chondromalacia patella.   Recent Labs: Lab Results  Component Value Date   WBC 3.2  (L) 01/28/2018   HGB 13.6 01/28/2018   PLT 237 01/28/2018   NA 137 01/26/2018   K 4.1 01/26/2018   CL 106 01/26/2018   CO2 23 01/26/2018   GLUCOSE 93 01/26/2018   BUN 7 01/26/2018   CREATININE 0.88 01/26/2018   BILITOT 0.4 01/26/2018   ALKPHOS 44 01/26/2018   AST 21 01/26/2018   ALT 16 01/26/2018   PROT 7.7 01/26/2018   ALBUMIN 4.3 01/26/2018   CALCIUM 9.3 01/26/2018   GFRAA >60 04/12/2017    Speciality Comments: No specialty comments available. X-ray of the left knee joint January 31, 2015 showed large effusion Procedures:  No procedures performed Allergies: Patient has no known allergies.   Assessment / Plan:     Visit Diagnoses: Polyarthralgia -patient complains of pain in multiple joints.  Pain in both hands -he complains of pain in bilateral wrist joints and hands.  No synovitis was noted.  Plan: XR Hand 2 View Right, XR Hand 2 View Left, x-ray of bilateral hands were unremarkable.  Chronic pain of both hips -painful range of motion of bilateral hip joints.  Plan: XR HIPS BILAT W OR W/O PELVIS 2V, XR HIP UNILAT W OR W/O PELVIS 2-3 VIEWS RIGHT, x-ray of bilateral hip joints were unremarkable.  Chronic pain of both knees -he had and limited extension of bilateral knee joints but no warmth swelling or effusion was noted.  Patient states he has had aspiration of bilateral knee joints several years ago.  I also found x-ray of his knee joint from 2016 which showed effusion.  Plan: XR KNEE 3 VIEW RIGHT, XR KNEE 3 VIEW LEFT, bilateral moderate medial compartment and moderate chondromalacia patella was noted.  I will obtain following labs to evaluate this further.  Uric acid, Rheumatoid factor, HLA-B27 antigen, Angiotensin converting enzyme, Cyclic citrul peptide antibody, IgG,   Positive ANA (antinuclear antibody) - 7/19.18: sed rate 1, CRP 9.5, RF <14, ANA 1:320 Homogeneous -patient denies any history of oral ulcers nasal ulcers malar rash photosensitivity Raynaud's phenomenon.  I  will obtain following labs today.  Plan: Urinalysis, Routine w reflex microscopic, Sedimentation rate, Anti-scleroderma antibody, RNP Antibody, Anti-Smith antibody, Sjogrens syndrome-A extractable nuclear antibody, Sjogrens syndrome-B extractable nuclear antibody, Anti-DNA antibody, double-stranded, C3 and C4, Beta-2 glycoprotein antibodies, Cardiolipin antibodies, IgG, IgM, IgA, Lupus Anticoagulant Eval w/Reflex,  Other insomnia -he gives history of chronic insomnia.  Good sleep hygiene was discussed.  Other  fatigue -most likely related to chronic insomnia.  Plan: CBC with Differential/Platelet, COMPLETE METABOLIC PANEL WITH GFR, CK, TSH, Glucose 6 phosphate dehydrogenase, Hepatitis B core antibody, IgM, Hepatitis B surface antigen, Hepatitis C antibody,   Family history of lupus erythematosus -in 3 of his paternal first cousins.  Status post left foot surgery -he continues to have some discomfort.  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 XR Hand 2 View Right  . XR Hand 2 View Left  . XR KNEE 3 VIEW RIGHT  . XR KNEE 3 VIEW LEFT  . XR HIPS BILAT W OR W/O PELVIS 3-4 VIEWS  . CBC with Differential/Platelet  . COMPLETE METABOLIC PANEL WITH GFR  . Urinalysis, Routine w reflex microscopic  . CK  . TSH  . Sedimentation rate  . Uric acid  . Rheumatoid factor  . HLA-B27 antigen  . Angiotensin converting enzyme  . Cyclic citrul peptide antibody, IgG  . Anti-scleroderma antibody  . RNP Antibody  . Anti-Smith antibody  . Sjogrens syndrome-A extractable nuclear antibody  . Sjogrens syndrome-B extractable nuclear antibody  . Anti-DNA antibody, double-stranded  . C3 and C4  . Beta-2 glycoprotein antibodies  . Cardiolipin antibodies, IgG, IgM, IgA  . Lupus Anticoagulant Eval w/Reflex  . Glucose 6 phosphate dehydrogenase  . Hepatitis B core antibody, IgM  . Hepatitis B surface antigen  . Hepatitis C antibody    No orders of the defined types were placed in this encounter.   Face-to-face time spent with patient was 50 minutes. Greater than 50% of time was spent in counseling and coordination of care.  Follow-Up Instructions: Return for Pain in multiple joints.   Bo Merino, MD  Note - This record has been created using Editor, commissioning.  Chart creation errors have been sought, but may not always  have been located. Such creation errors do not reflect on  the standard of medical care.

## 2018-09-01 ENCOUNTER — Ambulatory Visit (INDEPENDENT_AMBULATORY_CARE_PROVIDER_SITE_OTHER): Payer: No Typology Code available for payment source

## 2018-09-01 ENCOUNTER — Ambulatory Visit: Payer: Self-pay

## 2018-09-01 ENCOUNTER — Encounter: Payer: Self-pay | Admitting: Rheumatology

## 2018-09-01 ENCOUNTER — Other Ambulatory Visit: Payer: Self-pay

## 2018-09-01 ENCOUNTER — Ambulatory Visit (INDEPENDENT_AMBULATORY_CARE_PROVIDER_SITE_OTHER): Payer: No Typology Code available for payment source | Admitting: Rheumatology

## 2018-09-01 VITALS — BP 159/102 | HR 58 | Resp 14 | Ht 62.5 in | Wt 253.0 lb

## 2018-09-01 DIAGNOSIS — Z789 Other specified health status: Secondary | ICD-10-CM

## 2018-09-01 DIAGNOSIS — G8929 Other chronic pain: Secondary | ICD-10-CM | POA: Diagnosis not present

## 2018-09-01 DIAGNOSIS — M79641 Pain in right hand: Secondary | ICD-10-CM

## 2018-09-01 DIAGNOSIS — M25552 Pain in left hip: Secondary | ICD-10-CM | POA: Diagnosis not present

## 2018-09-01 DIAGNOSIS — M25551 Pain in right hip: Secondary | ICD-10-CM

## 2018-09-01 DIAGNOSIS — M25561 Pain in right knee: Secondary | ICD-10-CM

## 2018-09-01 DIAGNOSIS — M79642 Pain in left hand: Secondary | ICD-10-CM

## 2018-09-01 DIAGNOSIS — Z9889 Other specified postprocedural states: Secondary | ICD-10-CM

## 2018-09-01 DIAGNOSIS — M25562 Pain in left knee: Secondary | ICD-10-CM

## 2018-09-01 DIAGNOSIS — G4709 Other insomnia: Secondary | ICD-10-CM

## 2018-09-01 DIAGNOSIS — M2550 Pain in unspecified joint: Secondary | ICD-10-CM | POA: Diagnosis not present

## 2018-09-01 DIAGNOSIS — R5383 Other fatigue: Secondary | ICD-10-CM

## 2018-09-01 DIAGNOSIS — Z840 Family history of diseases of the skin and subcutaneous tissue: Secondary | ICD-10-CM

## 2018-09-01 DIAGNOSIS — R768 Other specified abnormal immunological findings in serum: Secondary | ICD-10-CM

## 2018-09-01 DIAGNOSIS — F640 Transsexualism: Secondary | ICD-10-CM

## 2018-09-03 LAB — URINALYSIS, ROUTINE W REFLEX MICROSCOPIC
Bacteria, UA: NONE SEEN /HPF
Bilirubin Urine: NEGATIVE
Glucose, UA: NEGATIVE
Hgb urine dipstick: NEGATIVE
Hyaline Cast: NONE SEEN /LPF
Ketones, ur: NEGATIVE
Nitrite: NEGATIVE
Protein, ur: NEGATIVE
RBC / HPF: NONE SEEN /HPF (ref 0–2)
Specific Gravity, Urine: 1.016 (ref 1.001–1.03)
Squamous Epithelial / HPF: NONE SEEN /HPF (ref <=5)
WBC, UA: NONE SEEN /HPF (ref 0–5)
pH: 6 (ref 5.0–8.0)

## 2018-09-03 LAB — COMPLETE METABOLIC PANEL WITH GFR
AG Ratio: 1.4 (calc) (ref 1.0–2.5)
ALT: 12 U/L (ref 6–29)
AST: 15 U/L (ref 10–30)
Albumin: 4.2 g/dL (ref 3.6–5.1)
Alkaline phosphatase (APISO): 59 U/L (ref 31–125)
BUN: 12 mg/dL (ref 7–25)
CO2: 25 mmol/L (ref 20–32)
Calcium: 9.3 mg/dL (ref 8.6–10.2)
Chloride: 105 mmol/L (ref 98–110)
Creat: 0.73 mg/dL (ref 0.50–1.10)
GFR, Est African American: 125 mL/min/{1.73_m2} (ref >=60)
GFR, Est Non African American: 107 mL/min/{1.73_m2} (ref >=60)
Globulin: 3.1 g/dL (calc) (ref 1.9–3.7)
Glucose, Bld: 89 mg/dL (ref 65–99)
Potassium: 4.1 mmol/L (ref 3.5–5.3)
Sodium: 134 mmol/L — ABNORMAL LOW (ref 135–146)
Total Bilirubin: 0.5 mg/dL (ref 0.2–1.2)
Total Protein: 7.3 g/dL (ref 6.1–8.1)

## 2018-09-03 LAB — CBC WITH DIFFERENTIAL/PLATELET
Absolute Monocytes: 296 cells/uL (ref 200–950)
Basophils Absolute: 19 cells/uL (ref 0–200)
Basophils Relative: 0.5 %
Eosinophils Absolute: 30 cells/uL (ref 15–500)
Eosinophils Relative: 0.8 %
HCT: 42.7 % (ref 35.0–45.0)
Hemoglobin: 14.3 g/dL (ref 11.7–15.5)
Lymphs Abs: 1250 cells/uL (ref 850–3900)
MCH: 29.4 pg (ref 27.0–33.0)
MCHC: 33.5 g/dL (ref 32.0–36.0)
MCV: 87.9 fL (ref 80.0–100.0)
MPV: 10.7 fL (ref 7.5–12.5)
Monocytes Relative: 7.8 %
Neutro Abs: 2204 cells/uL (ref 1500–7800)
Neutrophils Relative %: 58 %
Platelets: 266 10*3/uL (ref 140–400)
RBC: 4.86 10*6/uL (ref 3.80–5.10)
RDW: 12.9 % (ref 11.0–15.0)
Total Lymphocyte: 32.9 %
WBC: 3.8 10*3/uL (ref 3.8–10.8)

## 2018-09-03 LAB — URIC ACID: Uric Acid, Serum: 7.8 mg/dL — ABNORMAL HIGH (ref 2.5–7.0)

## 2018-09-03 LAB — TSH: TSH: 0.31 mIU/L — ABNORMAL LOW

## 2018-09-03 LAB — GLUCOSE 6 PHOSPHATE DEHYDROGENASE: G-6PDH: 14.9 U/g Hgb (ref 7.0–20.5)

## 2018-09-03 LAB — ANTI-SMITH ANTIBODY: ENA SM Ab Ser-aCnc: <1 AI

## 2018-09-03 LAB — RHEUMATOID FACTOR: Rhuematoid fact SerPl-aCnc: <14 IU/mL (ref <14)

## 2018-09-03 LAB — BETA-2 GLYCOPROTEIN ANTIBODIES
Beta-2 Glyco 1 IgA: 10 SAU (ref <=20)
Beta-2 Glyco 1 IgM: <9 SMU (ref <=20)
Beta-2 Glyco I IgG: <9 SGU (ref <=20)

## 2018-09-03 LAB — ANTI-DNA ANTIBODY, DOUBLE-STRANDED: ds DNA Ab: 7 IU/mL — ABNORMAL HIGH

## 2018-09-03 LAB — SJOGRENS SYNDROME-A EXTRACTABLE NUCLEAR ANTIBODY: SSA (Ro) (ENA) Antibody, IgG: >8 AI — AB

## 2018-09-03 LAB — ANGIOTENSIN CONVERTING ENZYME: Angiotensin-Converting Enzyme: 87 U/L — ABNORMAL HIGH (ref 9–67)

## 2018-09-03 LAB — RNP ANTIBODY: Ribonucleic Protein(ENA) Antibody, IgG: <1 AI

## 2018-09-03 LAB — HLA-B27 ANTIGEN: HLA-B27 Antigen: NEGATIVE

## 2018-09-03 LAB — CARDIOLIPIN ANTIBODIES, IGG, IGM, IGA
Anticardiolipin IgA: <11 [APL'U]
Anticardiolipin IgG: <14 [GPL'U]
Anticardiolipin IgM: <12 [MPL'U]

## 2018-09-03 LAB — LUPUS ANTICOAGULANT EVAL W/ REFLEX
PTT-LA Screen: 35 s (ref <=40)
dRVVT: 43 s (ref <=45)

## 2018-09-03 LAB — C3 AND C4
C3 Complement: 124 mg/dL (ref 83–193)
C4 Complement: 29 mg/dL (ref 15–57)

## 2018-09-03 LAB — SJOGRENS SYNDROME-B EXTRACTABLE NUCLEAR ANTIBODY: SSB (La) (ENA) Antibody, IgG: <1 AI

## 2018-09-03 LAB — HEPATITIS C ANTIBODY
Hepatitis C Ab: NONREACTIVE
SIGNAL TO CUT-OFF: 0.02 (ref <1.00)

## 2018-09-03 LAB — ANTI-SCLERODERMA ANTIBODY: Scleroderma (Scl-70) (ENA) Antibody, IgG: <1 AI

## 2018-09-03 LAB — CYCLIC CITRUL PEPTIDE ANTIBODY, IGG: Cyclic Citrullin Peptide Ab: <16 UNITS

## 2018-09-03 LAB — CK: Total CK: 187 U/L — ABNORMAL HIGH (ref 29–143)

## 2018-09-03 LAB — SEDIMENTATION RATE: Sed Rate: 6 mm/h (ref 0–20)

## 2018-09-03 LAB — HEPATITIS B SURFACE ANTIGEN: Hepatitis B Surface Ag: NONREACTIVE

## 2018-09-03 LAB — HEPATITIS B CORE ANTIBODY, IGM: Hep B C IgM: NONREACTIVE

## 2018-09-24 NOTE — Progress Notes (Signed)
Office Visit Note  Patient: Madeline Larsen             Date of Birth: [birth year]           MRN: [id number]             PCP: Flossie Buffy, NP Referring: Flossie Buffy, NP Visit Date: 10/06/2018 Occupation: '@GUAROCC' @  Subjective:  Fatigue, pain in joints.   History of Present Illness: Madeline Larsen is a 33 y.o. adult with history of fatigue and joint pain.  According to him he has been having increased pain and discomfort in his bilateral knee joints and ankles.  He has been also experiencing increased fatigue.  He notices intermittent swelling in his ankles and knee joints.  Activities of Daily Living:  Patient reports morning stiffness for 20-30 minutes.   Patient Reports nocturnal pain.  Difficulty dressing/grooming: Denies Difficulty climbing stairs: Reports Difficulty getting out of chair: Reports Difficulty using hands for taps, buttons, cutlery, and/or writing: Denies  Review of Systems  Constitutional: Positive for fatigue. Negative for night sweats, weight gain and weight loss.  HENT: Positive for nose dryness. Negative for mouth sores, trouble swallowing, trouble swallowing and mouth dryness.   Eyes: Positive for dryness. Negative for pain, redness, itching and visual disturbance.  Respiratory: Negative for cough, shortness of breath, wheezing and difficulty breathing.   Cardiovascular: Negative for chest pain, palpitations, hypertension, irregular heartbeat and swelling in legs/feet.  Gastrointestinal: Positive for constipation. Negative for abdominal pain, blood in stool and diarrhea.  Endocrine: Negative for increased urination.  Genitourinary: Negative for painful urination and vaginal dryness.  Musculoskeletal: Positive for arthralgias, joint pain, joint swelling and morning stiffness. Negative for myalgias, muscle weakness, muscle tenderness and myalgias.  Skin: Positive for rash. Negative for color change, hair loss, skin tightness, ulcers and  sensitivity to sunlight.  Allergic/Immunologic: Negative for susceptible to infections.  Neurological: Positive for headaches and weakness. Negative for dizziness, light-headedness, memory loss and night sweats.  Hematological: Negative for swollen glands.  Psychiatric/Behavioral: Negative for depressed mood, confusion and sleep disturbance. The patient is not nervous/anxious.     PMFS History:  Patient Active Problem List   Diagnosis Date Noted  . Neutropenia ([place]) 01/27/2018  . 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 Headache 06/18/2017  . 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 Status post left foot surgery 04/09/2015  . 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 Depression   . Female-to-female transgender person   . GERD (gastroesophageal reflux disease)   . MRSA (methicillin resistant staph aureus) culture positive     Family History  Problem Relation Age of Onset  . Alcohol abuse Mother   . Cancer Mother 16       Uterine cancer  . Drug abuse Mother   . HIV/AIDS Mother   . Early death Mother 60       uterine cancer  . Alcohol abuse Father   . Drug abuse Father   . Early death Father 49       muscular dystrophy  . Muscular dystrophy Father   . Alcohol abuse Sister   . Depression Sister   . Drug abuse Sister   . Depression Brother   . Kidney disease Maternal Grandmother        ESRD on dialysis  . Hypertension Maternal Grandmother   . Rheum arthritis Paternal Grandmother   . Rheum arthritis Brother   . Depression Sister   .  Depression Sister   . Depression Sister   . Lupus Cousin   . Lupus Cousin   . Lupus Cousin    Past Surgical History:  Procedure Laterality Date  . FOOT SURGERY Left   . top surgery  02/2018   Social History   Social History Narrative  . Not on file   Immunization History  Administered Date(s) Administered  . Tdap 01/26/2018     Objective: Vital Signs: BP 134/87 (BP Location: Left  Wrist, Patient Position: Sitting, Cuff Size: Normal)   Pulse 68   Resp 14   Ht 5' 2.5" (1.588 m)   Wt 260 lb 6.4 oz (118.1 kg)   BMI 46.87 kg/m    Physical Exam Vitals signs and nursing note reviewed.  Constitutional:      Appearance: He is well-developed.  HENT:     Head: Normocephalic and atraumatic.  Eyes:     Conjunctiva/sclera: Conjunctivae normal.  Neck:     Musculoskeletal: Normal range of motion.  Cardiovascular:     Rate and Rhythm: Normal rate and regular rhythm.     Heart sounds: Normal heart sounds.  Pulmonary:     Effort: Pulmonary effort is normal.     Breath sounds: Normal breath sounds.  Abdominal:     General: Bowel sounds are normal.     Palpations: Abdomen is soft.  Lymphadenopathy:     Cervical: No cervical adenopathy.  Skin:    General: Skin is warm and dry.     Capillary Refill: Capillary refill takes less than 2 seconds.  Neurological:     Mental Status: He is alert and oriented to person, place, and time.  Psychiatric:        Behavior: Behavior normal.      Musculoskeletal Exam: C-spine thoracic and lumbar spine with good range of motion.  Shoulder joints, elbow joints, wrist joints, MCPs PIPs DIPs with good range of motion.  Hip joints with good range of motion.  He has discomfort range of motion of bilateral knee joints and ankle joints.  No warmth swelling or effusion was noted.  CDAI Exam: CDAI Score: - Patient Global: -; Provider Global: - Swollen: -; Tender: - Joint Exam   No joint exam has been documented for this visit   There is currently no information documented on the homunculus. Go to the Rheumatology activity and complete the homunculus joint exam.  Investigation: No additional findings.  Imaging: No results found.  Recent Labs: Lab Results  Component Value Date   WBC 3.8 09/01/2018   HGB 14.3 09/01/2018   PLT 266 09/01/2018   NA 134 (L) 09/01/2018   K 4.1 09/01/2018   CL 105 09/01/2018   CO2 25 09/01/2018    GLUCOSE 89 09/01/2018   BUN 12 09/01/2018   CREATININE 0.73 09/01/2018   BILITOT 0.5 09/01/2018   ALKPHOS 44 01/26/2018   AST 15 09/01/2018   ALT 12 09/01/2018   PROT 7.3 09/01/2018   ALBUMIN 4.3 01/26/2018   CALCIUM 9.3 09/01/2018   GFRAA 125 09/01/2018  UA negative, TSH 0.31, CK 187, hepatitis B-, hepatitis C negative, G6PD normal lupus anticoagulant negative, beta-2 negative, anticardiolipin negative, complements normal, SSA >8.0, double-stranded DNA 7 (SCL 70, RNP, Smith, SSB negative), RF negative, anti-CCP negative, HLA-B27 negative, ACE 87, uric acid 7.8, ESR 6  7/19.18: sed rate 1, CRP 9.5, RF <14, ANA 1:320 Homogeneous  Speciality Comments: No specialty comments available.  Procedures:  No procedures performed Allergies: Patient has no known allergies.  Assessment / Plan:     Visit Diagnoses: Positive ANA (antinuclear antibody) - ANA 1: 320 homogeneous, SSA> 8.0, dsDNA 7, rest the ENA was negative, complements normal, ESR 6 -patient has been experiencing increased fatigue and increased joint pain.  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  He does not have very many symptoms at this time despite sicca symptoms, arthralgias, fatigue.  I discussed the option of Plaquenil.  Indication side effects after indications were discussed.  He was in agreement.  We will start him on Plaquenil 200 mg p.o. twice daily.  We will check labs in 1 month, 3 months and then every 5 months.  He will need baseline eye examination and eye examination on yearly basis.    Polyarthralgia - X-ray of bilateral hands and bilateral hip joints at the last visit were unremarkable.   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  He has significant pain in his bilateral knee joints which could be due to underlying osteoarthritis.  Weight loss diet and exercise was discussed.  Status post left foot surgery -he has chronic  pain.  Other fatigue -probably due to underlying autoimmune disease.  Other insomnia -good sleep hygiene was discussed.  Family history of lupus erythematosus   Female-to-female transgender person   Orders: Orders Placed This Encounter  Procedures  . CBC with Differential/Platelet  . COMPLETE METABOLIC PANEL WITH GFR   Meds ordered this encounter  Medications  . hydroxychloroquine (PLAQUENIL) 200 MG tablet    Sig: Take 1 tablet (200 mg total) by mouth 2 (two) times daily.    Dispense:  180 tablet    Refill:  0    Face-to-face time spent with patient was 30 minutes. Greater than 50% of time was spent in counseling and coordination of care.  Follow-Up Instructions: Return in about 3 months (around 01/06/2019) for Autoimmune disease.   Bo Merino, MD  Note - This record has been created using Editor, commissioning.  Chart creation errors have been sought, but may not always  have been located. Such creation errors do not reflect on  the standard of medical care.

## 2018-10-06 ENCOUNTER — Other Ambulatory Visit: Payer: Self-pay

## 2018-10-06 ENCOUNTER — Encounter: Payer: Self-pay | Admitting: Rheumatology

## 2018-10-06 ENCOUNTER — Ambulatory Visit (INDEPENDENT_AMBULATORY_CARE_PROVIDER_SITE_OTHER): Payer: No Typology Code available for payment source | Admitting: Rheumatology

## 2018-10-06 VITALS — BP 134/87 | HR 68 | Resp 14 | Ht 62.5 in | Wt 260.4 lb

## 2018-10-06 DIAGNOSIS — M2550 Pain in unspecified joint: Secondary | ICD-10-CM

## 2018-10-06 DIAGNOSIS — R768 Other specified abnormal immunological findings in serum: Secondary | ICD-10-CM

## 2018-10-06 DIAGNOSIS — M170 Bilateral primary osteoarthritis of knee: Secondary | ICD-10-CM | POA: Diagnosis not present

## 2018-10-06 DIAGNOSIS — Z79899 Other long term (current) drug therapy: Secondary | ICD-10-CM

## 2018-10-06 DIAGNOSIS — Z840 Family history of diseases of the skin and subcutaneous tissue: Secondary | ICD-10-CM

## 2018-10-06 DIAGNOSIS — F640 Transsexualism: Secondary | ICD-10-CM

## 2018-10-06 DIAGNOSIS — G4709 Other insomnia: Secondary | ICD-10-CM

## 2018-10-06 DIAGNOSIS — Z9889 Other specified postprocedural states: Secondary | ICD-10-CM | POA: Diagnosis not present

## 2018-10-06 DIAGNOSIS — R5383 Other fatigue: Secondary | ICD-10-CM

## 2018-10-06 DIAGNOSIS — Z789 Other specified health status: Secondary | ICD-10-CM

## 2018-10-06 MED ORDER — HYDROXYCHLOROQUINE SULFATE 200 MG PO TABS: 200.0000 mg | ORAL_TABLET | Freq: Two times a day (BID) | ORAL | 0 refills | Status: DC

## 2018-10-06 NOTE — Patient Instructions (Addendum)
Standing Labs We placed an order today for your standing lab work.    Please come back and get your standing labs in 1 month, 3 months, then every 5 months.  We have open lab daily Monday through Thursday from 8:30-12:30 PM and 1:30-4:30 PM and Friday from 8:30-12:30 PM and 1:30 -4:00 PM at the office of Dr. Pollyann SavoyShaili Deveshwar.   You may experience shorter wait times on Monday and Friday afternoons. The office is located at [address], [address], [place], [place] [address] No appointment is necessary.   Labs are drawn by [facility].  You may receive a bill from [facility] for your lab work.  If you wish to have your labs drawn at another location, please call the office 24 hours in advance to send orders.  If you have any questions regarding directions or hours of operation,  please call [phone].   Just as a reminder please drink plenty of water prior to coming for your lab work. Thanks!  Vaccines You are taking a medication(s) that can suppress your immune system.  The following immunizations are recommended: . Flu annually Pneumonia (Pneumovax 23 and Prevnar 13 spaced at least 1 year apart)Hydroxychloroquine tablets What is this medicine? HYDROXYCHLOROQUINE (hye drox ee KLOR oh kwin) is used to treat rheumatoid arthritis and systemic lupus erythematosus. It is also used to treat malaria. This medicine may be used for other purposes; ask your health care provider or pharmacist if you have questions. COMMON BRAND NAME(S): Plaquenil, Quineprox What should I tell my health care provider before I take this medicine? They need to know if you have any of these conditions: diabetes eye disease, vision problems G6PD deficiency heart disease history of irregular heartbeat if you often drink alcohol kidney disease liver disease porphyria psoriasis an unusual or allergic reaction to chloroquine, hydroxychloroquine, other medicines, foods, dyes, or preservatives pregnant or trying to get  pregnant breast-feeding How should I use this medicine? Take this medicine by mouth with a glass of water. Follow the directions on the prescription label. Do not cut, crush or chew this medicine. Swallow the tablets whole. Take this medicine with food. Avoid taking antacids within 4 hours of taking this medicine. It is best to separate these medicines by at least 4 hours. Take your medicine at regular intervals. Do not take it more often than directed. Take all of your medicine as directed even if you think you are better. Do not skip doses or stop your medicine early. Talk to your pediatrician regarding the use of this medicine in children. While this drug may be prescribed for selected conditions, precautions do apply. Overdosage: If you think you have taken too much of this medicine contact a poison control center or emergency room at once. NOTE: This medicine is only for you. Do not share this medicine with others. What if I miss a dose? If you miss a dose, take it as soon as you can. If it is almost time for your next dose, take only that dose. Do not take double or extra doses. What may interact with this medicine? Do not take this medicine with any of the following medications: cisapride dronedarone pimozide thioridazine This medicine may also interact with the following medications: ampicillin antacids cimetidine cyclosporine digoxin kaolin medicines for diabetes, like insulin, glipizide, glyburide medicines for seizures like carbamazepine, phenobarbital, phenytoin mefloquine methotrexate other medicines that prolong the QT interval (cause an abnormal heart rhythm) praziquantel This list may not describe all possible interactions. Give your health  care provider a list of all the medicines, herbs, non-prescription drugs, or dietary supplements you use. Also tell them if you smoke, drink alcohol, or use illegal drugs. Some items may interact with your medicine. What should I watch  for while using this medicine? Visit your health care professional for regular checks on your progress. Tell your health care professional if your symptoms do not start to get better or if they get worse. You may need blood work done while you are taking this medicine. If you take other medicines that can affect heart rhythm, you may need more testing. Talk to your health care professional if you have questions. Your vision may be tested before and during use of this medicine. Tell your health care professional right away if you have any change in your eyesight. What side effects may I notice from receiving this medicine? Side effects that you should report to your doctor or health care professional as soon as possible: allergic reactions like skin rash, itching or hives, swelling of the face, lips, or tongue changes in vision decreased hearing or ringing of the ears muscle weakness redness, blistering, peeling or loosening of the skin, including inside the mouth sensitivity to light signs and symptoms of a dangerous change in heartbeat or heart rhythm like chest pain; dizziness; fast or irregular heartbeat; palpitations; feeling faint or lightheaded, falls; breathing problems signs and symptoms of liver injury like dark yellow or brown urine; general ill feeling or flu-like symptoms; light-colored stools; loss of appetite; nausea; right upper belly pain; unusually weak or tired; yellowing of the eyes or skin signs and symptoms of low blood sugar such as feeling anxious; confusion; dizziness; increased hunger; unusually weak or tired; sweating; shakiness; cold; irritable; headache; blurred vision; fast heartbeat; loss of consciousness suicidal thoughts uncontrollable head, mouth, neck, arm, or leg movements Side effects that usually do not require medical attention (report to your doctor or health care professional if they continue or are bothersome): diarrhea dizziness hair  loss headache irritable loss of appetite nausea, vomiting stomach pain This list may not describe all possible side effects. Call your doctor for medical advice about side effects. You may report side effects to FDA at 1-800-FDA-1088. Where should I keep my medicine? Keep out of the reach of children. Store at room temperature between 15 and 30 degrees C (59 and 86 degrees F). Protect from moisture and light. Throw away any unused medicine after the expiration date. NOTE: This sheet is a summary. It may not cover all possible information. If you have questions about this medicine, talk to your doctor, pharmacist, or health care provider.  2020 Elsevier/Gold Standard (2018-07-05 12:56:32) .   Please check with your PCP to make sure you are up to date.

## 2018-10-06 NOTE — Progress Notes (Signed)
Pharmacy Note  Subjective: Patient presents today to the [facility] to see Dr. Estanislado Pandy.  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  Objective: CMP     Component Value Date/Time   NA 134 (L) 09/01/2018 0959   K 4.1 09/01/2018 0959   CL 105 09/01/2018 0959   CO2 25 09/01/2018 0959   GLUCOSE 89 09/01/2018 0959   BUN 12 09/01/2018 0959   CREATININE 0.73 09/01/2018 0959   CALCIUM 9.3 09/01/2018 0959   PROT 7.3 09/01/2018 0959   ALBUMIN 4.3 01/26/2018 0901   AST 15 09/01/2018 0959   ALT 12 09/01/2018 0959   ALKPHOS 44 01/26/2018 0901   BILITOT 0.5 09/01/2018 0959   GFRNONAA 107 09/01/2018 0959   GFRAA 125 09/01/2018 0959    CBC    Component Value Date/Time   WBC 3.8 09/01/2018 0959   RBC 4.86 09/01/2018 0959   HGB 14.3 09/01/2018 0959   HCT 42.7 09/01/2018 0959   PLT 266 09/01/2018 0959   MCV 87.9 09/01/2018 0959   MCH 29.4 09/01/2018 0959   MCHC 33.5 09/01/2018 0959   RDW 12.9 09/01/2018 0959   LYMPHSABS 1,250 09/01/2018 0959   MONOABS 0.3 01/26/2018 0901   EOSABS 30 09/01/2018 0959   BASOSABS 19 09/01/2018 0959    Assessment/Plan: Patient was counseled on the purpose, proper use, and adverse effects of hydroxychloroquine including nausea/diarrhea, skin rash, headaches, and sun sensitivity.  Discussed importance of annual eye exams while on hydroxychloroquine to monitor to ocular toxicity and discussed importance of frequent laboratory monitoring.  Provided patient with eye exam form for baseline ophthalmologic exam and standing lab instructions.  Provided patient with educational materials on hydroxychloroquine and answered all questions.  Patient consented to hydroxychloroquine.  Will upload consent in the media tab.    Dose will be Plaquenil 200 mg twice daily based on weight of 118.1 kg.  [facility].  All questions encouraged and answered.   Instructed patient to call with any further questions or concerns.  Mariella Saa, PharmD, [place], [place] Clinical Specialty Pharmacist [phone]  10/06/2018 2:38 PM

## 2018-10-18 ENCOUNTER — Other Ambulatory Visit: Payer: Self-pay

## 2018-10-18 ENCOUNTER — Other Ambulatory Visit: Payer: Self-pay | Admitting: Endocrinology

## 2018-10-18 MED ORDER — TESTOSTERONE CYPIONATE 200 MG/ML IM SOLN: 80.0000 mg | INTRAMUSCULAR | 0 refills | Status: DC

## 2018-11-19 ENCOUNTER — Telehealth: Payer: Self-pay | Admitting: Endocrinology

## 2018-11-19 MED ORDER — TESTOSTERONE CYPIONATE 200 MG/ML IM SOLN: 80.0000 mg | INTRAMUSCULAR | 0 refills | Status: DC

## 2018-11-19 NOTE — Telephone Encounter (Signed)
I checked [place]-CSRS.  8/20 rx was not filled.   I sent refill.

## 2018-11-19 NOTE — Telephone Encounter (Signed)
MEDICATION: testosterone cypionate (DEPOTESTOSTERONE CYPIONATE) 200 MG/ML injection - patient stated the pharmacy informed the RX was dated wrong and will need a new RX  PHARMACY:  [id number] - [place], [place] - [address]  IS THIS A 90 DAY SUPPLY :   IS PATIENT OUT OF MEDICATION: YES  IF NOT; HOW MUCH IS LEFT:   LAST APPOINTMENT DATE: @8 /12/2018  NEXT APPOINTMENT DATE:@Visit  date not found  DO WE HAVE YOUR PERMISSION TO LEAVE A DETAILED MESSAGE:  OTHER COMMENTS:    **Let patient know to contact pharmacy at the end of the day to make sure medication is ready. **  ** Please notify patient to allow 48-72 hours to process**  **Encourage patient to contact the pharmacy for refills or they can request refills through [facility]**

## 2018-11-19 NOTE — Telephone Encounter (Signed)
Please review and advise.

## 2018-11-19 NOTE — Addendum Note (Signed)
Addended by: Renato Shin on: 11/19/2018 11:23 AM   Modules accepted: Orders

## 2018-12-02 ENCOUNTER — Other Ambulatory Visit: Payer: Self-pay | Admitting: Family Medicine

## 2018-12-02 ENCOUNTER — Other Ambulatory Visit: Payer: Self-pay

## 2018-12-02 ENCOUNTER — Encounter: Payer: Self-pay | Admitting: Nurse Practitioner

## 2018-12-02 DIAGNOSIS — Z79899 Other long term (current) drug therapy: Secondary | ICD-10-CM

## 2018-12-02 LAB — COMPLETE METABOLIC PANEL WITH GFR
AG Ratio: 1.5 (calc) (ref 1.0–2.5)
ALT: 14 U/L (ref 6–29)
AST: 18 U/L (ref 10–30)
Albumin: 4.3 g/dL (ref 3.6–5.1)
Alkaline phosphatase (APISO): 49 U/L (ref 31–125)
BUN: 9 mg/dL (ref 7–25)
CO2: 26 mmol/L (ref 20–32)
Calcium: 9.3 mg/dL (ref 8.6–10.2)
Chloride: 105 mmol/L (ref 98–110)
Creat: 0.89 mg/dL (ref 0.50–1.10)
GFR, Est African American: 98 mL/min/{1.73_m2} (ref >=60)
GFR, Est Non African American: 85 mL/min/{1.73_m2} (ref >=60)
Globulin: 2.9 g/dL (calc) (ref 1.9–3.7)
Glucose, Bld: 86 mg/dL (ref 65–99)
Potassium: 4.5 mmol/L (ref 3.5–5.3)
Sodium: 138 mmol/L (ref 135–146)
Total Bilirubin: 0.6 mg/dL (ref 0.2–1.2)
Total Protein: 7.2 g/dL (ref 6.1–8.1)

## 2018-12-02 LAB — CBC WITH DIFFERENTIAL/PLATELET
Absolute Monocytes: 432 cells/uL (ref 200–950)
Basophils Absolute: 20 cells/uL (ref 0–200)
Basophils Relative: 0.6 %
Eosinophils Absolute: 30 cells/uL (ref 15–500)
Eosinophils Relative: 0.9 %
HCT: 41.4 % (ref 35.0–45.0)
Hemoglobin: 13.6 g/dL (ref 11.7–15.5)
Lymphs Abs: 1399 cells/uL (ref 850–3900)
MCH: 28.4 pg (ref 27.0–33.0)
MCHC: 32.9 g/dL (ref 32.0–36.0)
MCV: 86.4 fL (ref 80.0–100.0)
MPV: 11.3 fL (ref 7.5–12.5)
Monocytes Relative: 13.1 %
Neutro Abs: 1419 cells/uL — ABNORMAL LOW (ref 1500–7800)
Neutrophils Relative %: 43 %
Platelets: 212 10*3/uL (ref 140–400)
RBC: 4.79 10*6/uL (ref 3.80–5.10)
RDW: 11.6 % (ref 11.0–15.0)
Total Lymphocyte: 42.4 %
WBC: 3.3 10*3/uL — ABNORMAL LOW (ref 3.8–10.8)

## 2018-12-02 MED ORDER — ONDANSETRON 8 MG PO TBDP: 8.0000 mg | ORAL_TABLET | Freq: Three times a day (TID) | ORAL | 0 refills | Status: DC | PRN

## 2018-12-02 NOTE — Telephone Encounter (Signed)
Has had zofran several times previously.  Rx renewed.

## 2018-12-03 ENCOUNTER — Other Ambulatory Visit: Payer: Self-pay

## 2018-12-03 DIAGNOSIS — Z79899 Other long term (current) drug therapy: Secondary | ICD-10-CM

## 2018-12-13 ENCOUNTER — Encounter: Payer: Self-pay | Admitting: Rheumatology

## 2018-12-13 NOTE — Progress Notes (Signed)
Office Visit Note  Patient: Madeline Larsen             Date of Birth: Jun 14, 1985           MRN: [id number]             PCP: Flossie Buffy, NP Referring: Flossie Buffy, NP Visit Date: 12/14/2018 Occupation: '@GUAROCC' @  Subjective:  Muscle spasms   History of Present Illness: Madeline Larsen is a 33 y.o. adult with history of positive ANA and osteoarthritis.  He is taking plaquenil 200 mg 1 tablet by mouth twice daily.   He has been tolerating PLQ without any side effects.  He states he initially noticed significant improvement after starting on PLQ.  He states he was able to do activities that he had not been able to do in months.  He states that 6 days ago he was at work and started having increased lower back pain and lower extremity muscle tenderness and muscle cramps.  He denies any inciting event.  He denies any recent injuries or falls.  He has been having severe muscle cramps and muscle spasms in bilateral lower extremities.  He is having pain in bilateral knee joints and bilateral ankle joints.  He states that the pain is been constant and rates it a 10 out of 10.  He has tried using a heating pad which does help temporarily.  He is also tried over-the-counter products for pain relief but has not noticed any improvement.  He states the pain is been so severe he has been experiencing nausea and was prescribed Zofran by his PCP. He denies any sores in his mouth or nose.  He denies any recent rashes or photosensitivity.  He denies any symptoms of Raynaud's.  He continues to have chronic sicca symptoms.   Activities of Daily Living:  Patient reports joint stiffness all day Patient Reports nocturnal pain.  Difficulty dressing/grooming: Reports Difficulty climbing stairs: Reports Difficulty getting out of chair: Reports Difficulty using hands for taps, buttons, cutlery, and/or writing: Denies  Review of Systems  Constitutional: Positive for fatigue.  HENT: Positive for  mouth dryness and nose dryness. Negative for mouth sores.   Eyes: Positive for dryness. Negative for pain, itching and visual disturbance.  Respiratory: Negative for cough, hemoptysis, shortness of breath and difficulty breathing.   Cardiovascular: Negative for chest pain, palpitations, hypertension and swelling in legs/feet.  Gastrointestinal: Positive for constipation and nausea. Negative for abdominal pain, blood in stool and diarrhea.  Endocrine: Negative for increased urination.  Genitourinary: Negative for difficulty urinating and painful urination.  Musculoskeletal: Positive for arthralgias, joint pain, joint swelling, morning stiffness and muscle tenderness. Negative for myalgias, muscle weakness and myalgias.  Skin: Negative for color change, pallor, rash, hair loss, nodules/bumps, skin tightness, ulcers and sensitivity to sunlight.  Allergic/Immunologic: Negative for susceptible to infections.  Neurological: Negative for dizziness, headaches and memory loss.  Hematological: Negative for swollen glands.  Psychiatric/Behavioral: Positive for sleep disturbance. Negative for depressed mood and confusion. The patient is not nervous/anxious.     PMFS History:  Patient Active Problem List   Diagnosis Date Noted  . Neutropenia ([place]) 01/27/2018  . 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 Headache 06/18/2017  . 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 Status post left foot surgery 04/09/2015  . 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 Depression   . Female-to-female transgender person   . GERD (gastroesophageal reflux disease)   .  MRSA (methicillin resistant staph aureus) culture positive     Family History  Problem Relation Age of Onset  . Alcohol abuse Mother   . Cancer Mother 52       Uterine cancer  . Drug abuse Mother   . HIV/AIDS Mother   . Early death Mother 71       uterine cancer  . Alcohol abuse Father   . Drug abuse  Father   . Early death Father 41       muscular dystrophy  . Muscular dystrophy Father   . Alcohol abuse Sister   . Depression Sister   . Drug abuse Sister   . Depression Brother   . Kidney disease Maternal Grandmother        ESRD on dialysis  . Hypertension Maternal Grandmother   . Rheum arthritis Paternal Grandmother   . Rheum arthritis Brother   . Depression Sister   . Depression Sister   . Depression Sister   . Lupus Cousin   . Lupus Cousin   . Lupus Cousin    Past Surgical History:  Procedure Laterality Date  . FOOT SURGERY Left   . top surgery  02/2018   Social History   Social History Narrative  . Not on file   Immunization History  Administered Date(s) Administered  . Tdap 01/26/2018     Objective: Vital Signs: BP (!) 155/99 (BP Location: Left Wrist, Patient Position: Sitting, Cuff Size: Normal)   Pulse 65   Resp 17   Ht 5' 2.5" (1.588 m)   Wt 249 lb 6.4 oz (113.1 kg)   BMI 44.89 kg/m    Physical Exam Vitals signs and nursing note reviewed.  Constitutional:      Appearance: He is well-developed.  HENT:     Head: Normocephalic and atraumatic.  Eyes:     Conjunctiva/sclera: Conjunctivae normal.     Pupils: Pupils are equal, round, and reactive to light.  Neck:     Musculoskeletal: Normal range of motion and neck supple.  Cardiovascular:     Rate and Rhythm: Normal rate and regular rhythm.     Heart sounds: Normal heart sounds.  Pulmonary:     Effort: Pulmonary effort is normal.     Breath sounds: Normal breath sounds.  Abdominal:     General: Bowel sounds are normal.     Palpations: Abdomen is soft.  Skin:    General: Skin is warm and dry.     Capillary Refill: Capillary refill takes less than 2 seconds.  Neurological:     Mental Status: He is alert and oriented to person, place, and time.  Psychiatric:        Behavior: Behavior normal.      Musculoskeletal Exam: Generalized hyperalgesia.  C-spine good ROM.  Limited ROM of lumbar spine  with discomfort.  Midline spinal tenderness in lumbar region.  Shoulder joints, elbow joints, wrist joints, MCPs, PIPs, and DIPs good ROM with no synovitis.  Complete fist formation bilaterally.  Hip joints, knee joints, ankle joints, MTPs, PIPs, and DIPs good ROM with no synovitis.  Discomfort with ROM of both knee joints.  Tenderness of both ankle joints.   CDAI Exam: CDAI Score: - Patient Global: -; Provider Global: - Swollen: -; Tender: - Joint Exam   No joint exam has been documented for this visit   There is currently no information documented on the homunculus. Go to the Rheumatology activity and complete the homunculus joint exam.  Investigation: No additional findings.  Imaging: Xr Lumbar Spine 2-3 Views  Result Date: 12/14/2018 Mild dextroscoliosis was noted.  No significant disc space narrowing was noted.  Mild facet joint arthropathy was noted.  No SI joint to sclerosis was noted. Impression: These findings are consistent with mild dextroscoliosis and mild facet joint arthropathy.   Recent Labs: Lab Results  Component Value Date   WBC 3.3 (L) 12/02/2018   HGB 13.6 12/02/2018   PLT 212 12/02/2018   NA 138 12/02/2018   K 4.5 12/02/2018   CL 105 12/02/2018   CO2 26 12/02/2018   GLUCOSE 86 12/02/2018   BUN 9 12/02/2018   CREATININE 0.89 12/02/2018   BILITOT 0.6 12/02/2018   ALKPHOS 44 01/26/2018   AST 18 12/02/2018   ALT 14 12/02/2018   PROT 7.2 12/02/2018   ALBUMIN 4.3 01/26/2018   CALCIUM 9.3 12/02/2018   GFRAA 98 12/02/2018    Speciality Comments: No specialty comments available.  Procedures:  No procedures performed Allergies: Patient has no known allergies.     Assessment / Plan:     Visit Diagnoses: Positive ANA (antinuclear antibody) - ANA 1: 320 homogeneous, SSA> 8.0, dsDNA 7, rest the ENA was negative, complements normal, ESR 6, history of neutropenia: He has noticed significant improvement since starting on Plaquenil in July 2020.  He is taking  Plaquenil 200 mg 1 tablet by mouth twice daily.  He continues to have pain in bilateral knee joints and bilateral ankle joints.  No warmth or effusion was noted.  He has not had any oral or nasal ulcerations.  No recent rashes or photosensitivity.  No symptoms of Raynaud's.  No digital ulcerations or signs of gangrene were noted.  He continues to have chronic sicca symptoms.  He has not had any other new or worsening symptoms.  He will continue taking Plaquenil as prescribed.  He does not need a refill at this time.  He will follow up in 5 months.  High risk medication use - PLQ 200 mg 1 tablet by mouth twice daily (started in July 2020).  White blood cell count was 3.3 on 12/02/2018.  Patient has a known history of neutropenia.  CMP was within normal limits on 12/02/2018.  Polyarthralgia -  X-ray of bilateral hands and bilateral hip joints at the last visit were unremarkable.  He has no tenderness or synovitis of the MCP or PIP joints.  He has complete fist formation bilaterally.  Hip joints have good range of motion with no discomfort.  Primary osteoarthritis of both knees -  Bilateral moderate osteoarthritis and moderate chondromalacia patella: He has good range of motion with discomfort bilaterally.  No warmth or effusion was noted.  He has been having increased pain in both knee joints for the past 6 days.   Muscle cramps -He has been experiencing intermittent muscle cramps for the past 6 days.  He has been having muscle spasms in his lower back and bilateral lower extremities.  He has muscle aches and muscle tenderness in his lower extremities.  He has not had any muscle weakness.  He currently rates his pain a 10 out of 10.  He has tried taking over-the-counter products for pain relief which have been ineffective.  He is also tried using a heating pad which provides temporary relief.  He states the pain is been so severe that he is experiencing nausea and his PCP prescribed Zofran which he has been  taking as needed.  We will check a CK and sed rate today.  We will also refer him to neurology for further evaluation.  Plan: CK, Sedimentation rate  Chronic midline low back pain with bilateral sciatica -He has been having increased lower back pain for the past 6 days.  He has not had any recent injuries or falls.  He states that the pain started while he was at work and has progressively been getting worse.  He has midline spinal tenderness in the lumbar region.  He has severe pain with range of motion.  X-rays of the lumbar spine were obtained today.  He has mild dextroscoliosis and mild facet joint arthropathy.  He declined an MRI of the lumbar spine at this time.  We will refer him to neurology for further evaluation.  Plan: XR Lumbar Spine 2-3 Views, Ambulatory referral to Neurology  Status post left foot surgery  Other fatigue: He has chronic fatigue related to insomnia.   Other insomnia: He has been experiencing nocturnal pain which has caused interrupted sleep at night.  Family history of lupus erythematosus  Female-to-female transgender person    Orders: Orders Placed This Encounter  Procedures  . XR Lumbar Spine 2-3 Views  . CK  . Sedimentation rate  . Ambulatory referral to Neurology   No orders of the defined types were placed in this encounter.   Face-to-face time spent with patient was 30 minutes. Greater than 50% of time was spent in counseling and coordination of care.  Follow-Up Instructions: Return in 5 months (on 05/14/2019), or if symptoms worsen or fail to improve, for Positive ANA, Osteoarthritis.   Ofilia Neas, PA-C   I examined and evaluated the patient with Hazel Sams PA.  The lumbar spine x-ray was unremarkable except for dextroscoliosis.  He was having severe lower back pain with lower extremity pain.  I discussed the option of obtaining MRI or neurological referral.  He is concerned that he may have something neurological year wrong.  We made a neurology  referral for him.  The plan of care was discussed as noted above.  Bo Merino, MD  Note - This record has been created using Editor, commissioning.  Chart creation errors have been sought, but may not always  have been located. Such creation errors do not reflect on  the standard of medical care.

## 2018-12-14 ENCOUNTER — Ambulatory Visit (INDEPENDENT_AMBULATORY_CARE_PROVIDER_SITE_OTHER): Payer: No Typology Code available for payment source | Admitting: Physician Assistant

## 2018-12-14 ENCOUNTER — Ambulatory Visit (INDEPENDENT_AMBULATORY_CARE_PROVIDER_SITE_OTHER): Payer: No Typology Code available for payment source

## 2018-12-14 ENCOUNTER — Encounter: Payer: Self-pay | Admitting: Physician Assistant

## 2018-12-14 ENCOUNTER — Other Ambulatory Visit: Payer: Self-pay

## 2018-12-14 VITALS — BP 155/99 | HR 65 | Resp 17 | Ht 62.5 in | Wt 249.4 lb

## 2018-12-14 DIAGNOSIS — G8929 Other chronic pain: Secondary | ICD-10-CM

## 2018-12-14 DIAGNOSIS — M5441 Lumbago with sciatica, right side: Secondary | ICD-10-CM

## 2018-12-14 DIAGNOSIS — Z9889 Other specified postprocedural states: Secondary | ICD-10-CM

## 2018-12-14 DIAGNOSIS — Z79899 Other long term (current) drug therapy: Secondary | ICD-10-CM

## 2018-12-14 DIAGNOSIS — Z789 Other specified health status: Secondary | ICD-10-CM

## 2018-12-14 DIAGNOSIS — R252 Cramp and spasm: Secondary | ICD-10-CM

## 2018-12-14 DIAGNOSIS — R768 Other specified abnormal immunological findings in serum: Secondary | ICD-10-CM

## 2018-12-14 DIAGNOSIS — M5442 Lumbago with sciatica, left side: Secondary | ICD-10-CM | POA: Diagnosis not present

## 2018-12-14 DIAGNOSIS — M170 Bilateral primary osteoarthritis of knee: Secondary | ICD-10-CM

## 2018-12-14 DIAGNOSIS — F640 Transsexualism: Secondary | ICD-10-CM

## 2018-12-14 DIAGNOSIS — M2550 Pain in unspecified joint: Secondary | ICD-10-CM

## 2018-12-14 DIAGNOSIS — R5383 Other fatigue: Secondary | ICD-10-CM

## 2018-12-14 DIAGNOSIS — Z840 Family history of diseases of the skin and subcutaneous tissue: Secondary | ICD-10-CM

## 2018-12-14 DIAGNOSIS — G4709 Other insomnia: Secondary | ICD-10-CM

## 2018-12-15 LAB — CK: Total CK: 266 U/L — ABNORMAL HIGH (ref 29–143)

## 2018-12-15 LAB — SEDIMENTATION RATE: Sed Rate: 11 mm/h (ref 0–20)

## 2018-12-15 NOTE — Progress Notes (Signed)
Reviewed labs with Dr. Estanislado Pandy.  CK is elevated-266 but sed rate is WNL. He was referred to neurology for further evaluation.  No muscle weakness was noted on exam yesterday.

## 2018-12-16 ENCOUNTER — Encounter: Payer: Self-pay | Admitting: Neurology

## 2018-12-16 ENCOUNTER — Ambulatory Visit: Payer: No Typology Code available for payment source | Admitting: Physician Assistant

## 2018-12-16 ENCOUNTER — Ambulatory Visit: Payer: No Typology Code available for payment source | Admitting: Neurology

## 2018-12-16 ENCOUNTER — Other Ambulatory Visit: Payer: Self-pay

## 2018-12-16 DIAGNOSIS — M791 Myalgia, unspecified site: Secondary | ICD-10-CM

## 2018-12-16 DIAGNOSIS — M2550 Pain in unspecified joint: Secondary | ICD-10-CM | POA: Diagnosis not present

## 2018-12-16 DIAGNOSIS — R768 Other specified abnormal immunological findings in serum: Secondary | ICD-10-CM | POA: Diagnosis not present

## 2018-12-16 MED ORDER — GABAPENTIN 300 MG PO CAPS: ORAL_CAPSULE | ORAL | 11 refills | Status: DC

## 2018-12-16 MED ORDER — ETODOLAC 400 MG PO TABS: 400.0000 mg | ORAL_TABLET | Freq: Two times a day (BID) | ORAL | 5 refills | Status: DC

## 2018-12-16 NOTE — Progress Notes (Signed)
[facility]  PATIENT: Madeline Larsen DOB: 05/26/1985  REFERRING DOCTOR OR PCP: Dr. Estanislado Pandy (rheumatology), Dr. Lorayne Marek (PCP) SOURCE: Patient, notes from Dr. Estanislado Pandy, laboratory reports  _________________________________   HISTORICAL  CHIEF COMPLAINT:  Chief Complaint  Patient presents with  . New Patient (Initial Visit)    back pain affecting lower extremities: he has had problems on and off but it's been this bad since thursday. his back pain is not as constant as his leg pain.   Marland Kitchen Referral    Dr. Estanislado Pandy  . Room 12    here alone, has mask on    HISTORY OF PRESENT ILLNESS:  I had the pleasure of seeing your patient,  Madeline Larsen, at [facility] for neurologic consultation regarding his muscle pain and back pain.    He is a 33 year old female to female transgender man with polyarthralgia and myalgias.  He has a long history of pain in his legs and ankles and this has progressed over the last year..    He has seen Dr. Estanislado Pandy of rheumatology.  Some of his rheumatologic tests have been abnormal but there is no definite diagnosis.Marland Kitchen   He notes his pain is worse in the joints than muscles,especially the knees.   He has mild shoulder pain but otherwise arms feel fine most of the time,  Dr. Estanislado Pandy placed him on hydroxychloroquine.  He takes 400 mg ibuprofen and acetaminophen 1000 mg every 4-6 hours.      He notes mild weakness in his thighs and calves.   Arms are strong.  He notes no change in bladder function.  He denies much back pain but he does report back pain.  There is no radiating pain from the back no numbness in the arms or legs.  He is sleeping poorly at night.  Many nights he just gets 4 hours.   He snores some but has never been told there are pauses or gasps.   He denies depression.     I reviewed labs:   ESR, CBC, CMP; RF; HLAB27; CCP; Scl-70/ENA; C3/C4, Beta-2 glycoprotein; anti-cardiolipin; lupus anticoagulant; G6PDH, HepB,  HepC were all negative or normal.     CK minimally elevated at 266; dsDNA Ab is indeterminate at 7; SSA (Ro) was positive at 8.0; TSH was mildly low at 0.31; uric acid mildly elevated at 7.8; ACE is mildly elevated at 87.  Xrays of knees show moderate osteoarthritis.     REVIEW OF SYSTEMS: Constitutional: No fevers, chills, sweats, or change in appetite.  Poor sleep Eyes: No visual changes, double vision, eye pain Ear, nose and throat: No hearing loss, ear pain, nasal congestion, sore throat Cardiovascular: No chest pain, palpitations Respiratory: No shortness of breath at rest or with exertion.   No wheezes GastrointestinaI: No nausea, vomiting, diarrhea, abdominal pain, fecal incontinence Genitourinary: No dysuria, urinary retention or frequency.  No nocturia. Musculoskeletal:As above Integumentary: No rash, pruritus, skin lesions Neurological: as above Psychiatric: No depression at this time.  No anxiety Endocrine: No palpitations, diaphoresis, change in appetite, change in weigh or increased thirst Hematologic/Lymphatic: No anemia, purpura, petechiae. Allergic/Immunologic: No itchy/runny eyes, nasal congestion, recent allergic reactions, rashes  ALLERGIES: No Known Allergies  HOME MEDICATIONS:  Current Outpatient Medications:  .  acetaminophen (TYLENOL) 500 MG tablet, Take 1,000 mg by mouth every 6 (six) hours as needed for pain., Disp: , Rfl:  .  ALPRAZolam (XANAX) 1 MG tablet, Take 1 mg by mouth at bedtime as needed. for sleep, Disp: ,  Rfl: 0 .  hydroxychloroquine (PLAQUENIL) 200 MG tablet, Take 1 tablet (200 mg total) by mouth 2 (two) times daily., Disp: 180 tablet, Rfl: 0 .  hyoscyamine (LEVSIN, ANASPAZ) 0.125 MG tablet, Take 1 tablet (0.125 mg total) by mouth every 6 (six) hours as needed for cramping., Disp: 30 tablet, Rfl: 1 .  ibuprofen (ADVIL,MOTRIN) 200 MG tablet, Take by mouth as needed. , Disp: , Rfl:  .  lactulose (CHRONULAC) 10 GM/15ML solution, Take 30 mLs (20  g total) by mouth daily as needed for severe constipation., Disp: 236 mL, Rfl: 0 .  omeprazole (PRILOSEC) 20 MG capsule, Take 1 capsule (20 mg total) by mouth daily. (Patient taking differently: Take 20 mg by mouth as needed. ), Disp: 30 capsule, Rfl: 3 .  ondansetron (ZOFRAN ODT) 8 MG disintegrating tablet, Take 1 tablet (8 mg total) by mouth every 8 (eight) hours as needed for nausea or vomiting., Disp: 20 tablet, Rfl: 0 .  senna-docusate (SENOKOT-S) 8.6-50 MG tablet, Take 1 tablet by mouth at bedtime., Disp: 30 tablet, Rfl: 0 .  testosterone cypionate (DEPOTESTOSTERONE CYPIONATE) 200 MG/ML injection, Inject 0.4 mLs (80 mg total) into the muscle once a week., Disp: 10 mL, Rfl: 0 .  etodolac (LODINE) 400 MG tablet, Take 1 tablet (400 mg total) by mouth 2 (two) times daily., Disp: 60 tablet, Rfl: 5 .  gabapentin (NEURONTIN) 300 MG capsule, One po qAM; one po qPM and two po qHS, Disp: 120 capsule, Rfl: 11  PAST MEDICAL HISTORY: Past Medical History:  Diagnosis Date  . Depression   . Female-to-female transgender person   . GERD (gastroesophageal reflux disease)   . MRSA (methicillin resistant staph aureus) culture positive     PAST SURGICAL HISTORY: Past Surgical History:  Procedure Laterality Date  . FOOT SURGERY Left   . top surgery  02/2018    FAMILY HISTORY: Family History  Problem Relation Age of Onset  . Alcohol abuse Mother   . Cancer Mother 84       Uterine cancer  . Drug abuse Mother   . HIV/AIDS Mother   . Early death Mother 25       uterine cancer  . Alcohol abuse Father   . Drug abuse Father   . Early death Father 81       muscular dystrophy  . Muscular dystrophy Father   . Alcohol abuse Sister   . Depression Sister   . Drug abuse Sister   . Depression Brother   . Kidney disease Maternal Grandmother        ESRD on dialysis  . Hypertension Maternal Grandmother   . Rheum arthritis Paternal Grandmother   . Rheum arthritis Brother   . Depression Sister   .  Depression Sister   . Depression Sister   . Lupus Cousin   . Lupus Cousin   . Lupus Cousin     SOCIAL HISTORY:  Social History   Socioeconomic History  . Marital status: Married    Spouse name: Not on file  . Number of children: 2  . Years of education: Not on file  . Highest education level: High school graduate  Occupational History  . Not on file  Social Needs  . Financial resource strain: Not on file  . Food insecurity    Worry: Not on file    Inability: Not on file  . Transportation needs    Medical: Not on file    Non-medical: Not on file  Tobacco Use  .  Smoking status: Never Smoker  . Smokeless tobacco: Never Used  Substance and Sexual Activity  . Alcohol use: Yes    Alcohol/week: 0.0 standard drinks    Comment: socially  . Drug use: Not Currently    Types: Marijuana  . Sexual activity: Yes    Birth control/protection: None    Comment: female partner  Lifestyle  . Physical activity    Days per week: Not on file    Minutes per session: Not on file  . Stress: Not on file  Relationships  . Social Herbalist on phone: Not on file    Gets together: Not on file    Attends religious service: Not on file    Active member of club or organization: Not on file    Attends meetings of clubs or organizations: Not on file    Relationship status: Not on file  . Intimate partner violence    Fear of current or ex partner: Not on file    Emotionally abused: Not on file    Physically abused: Not on file    Forced sexual activity: Not on file  Other Topics Concern  . Not on file  Social History Narrative   Lives at home with wife & kids   Right handed     PHYSICAL EXAM  Vitals:   12/16/18 1036  BP: (!) 129/91  Pulse: 60  Temp: 98.4 F (36.9 C)  Weight: 248 lb 8 oz (112.7 kg)  Height: 5' 2.5" (1.588 m)    Body mass index is 44.73 kg/m.   General: The patient is in no acute distress  HEENT:  Head is [place]/AT.  Sclera are anicteric.     Neck:  No carotid bruits are noted.  The neck is nontender.  Cardiovascular: The heart has a regular rate and rhythm with a normal S1 and S2. There were no murmurs, gallops or rubs.    Skin: Extremities are without rash or  edema.  Musculoskeletal:  Back is mildly tender.  Much more tenderness at the knees, left greater than right.  Mild ankle tenderness.  There is moderate tenderness over the proximal muscles of both legs and moderate tenderness over the trochanteric bursae.  Milder tenderness in the cervical paraspinal muscles.  No tenderness in the upper chest muscles.  Neurologic Exam  Mental status: The patient is alert and oriented x 3 at the time of the examination. The patient has apparent normal recent and remote memory, with an apparently normal attention span and concentration ability.   Speech is normal.  Cranial nerves: Extraocular movements are full. Pupils are equal, round, and reactive to light and accomodation.  Visual fields are full.  Facial symmetry is present. There is good facial sensation to soft touch bilaterally.Facial strength is normal.  Trapezius and sternocleidomastoid strength is normal. No dysarthria is noted.  The tongue is midline, and the patient has symmetric elevation of the soft palate. No obvious hearing deficits are noted.  Motor:  Muscle bulk is normal.   Tone is normal. Strength is  5 / 5 in all 4 extremities.   Sensory: Sensory testing is intact to pinprick, soft touch and vibration sensation in all 4 extremities.  Coordination: Cerebellar testing reveals good finger-nose-finger and heel-to-shin bilaterally.  Gait and station: Station is normal.   Gait is arthritic.  Tandem gait is mildly wide.  Romberg is negative.   Reflexes: Deep tendon reflexes are symmetric and normal bilaterally.   Plantar responses are flexor.  DIAGNOSTIC DATA (LABS, IMAGING, TESTING) - I reviewed patient records, labs, notes, testing and imaging myself where available.  Lab  Results  Component Value Date   WBC 3.3 (L) 12/02/2018   HGB 13.6 12/02/2018   HCT 41.4 12/02/2018   MCV 86.4 12/02/2018   PLT 212 12/02/2018      Component Value Date/Time   NA 138 12/02/2018 1019   K 4.5 12/02/2018 1019   CL 105 12/02/2018 1019   CO2 26 12/02/2018 1019   GLUCOSE 86 12/02/2018 1019   BUN 9 12/02/2018 1019   CREATININE 0.89 12/02/2018 1019   CALCIUM 9.3 12/02/2018 1019   PROT 7.2 12/02/2018 1019   ALBUMIN 4.3 01/26/2018 0901   AST 18 12/02/2018 1019   ALT 14 12/02/2018 1019   ALKPHOS 44 01/26/2018 0901   BILITOT 0.6 12/02/2018 1019   GFRNONAA 85 12/02/2018 1019   GFRAA 98 12/02/2018 1019   Lab Results  Component Value Date   CHOL 125 01/26/2018   HDL 34.80 (L) 01/26/2018   LDLCALC 75 01/26/2018   TRIG 76.0 01/26/2018   CHOLHDL 4 01/26/2018   No results found for: HGBA1C Lab Results  Component Value Date   OIZTIWPY09 983 01/28/2018   Lab Results  Component Value Date   TSH 0.31 (L) 09/01/2018       ASSESSMENT AND PLAN  Myalgia  Polyarthralgia  ANA positive   In summary, Madeline Larsen is a 33 year old with myalgias and arthralgias of unknown etiology.  His creatinine kinase was elevated but there is no weakness and a primary or inflammatory myopathy is not likely.  He initially felt there was a response to Plaquenil but now is not sure.  He does have significant osteoarthritis on x-rays of the knees which is likely playing some role.  The pain, I will place him on etodolac 400 mg twice daily at that might work better for him than the intermittent Motrin and Tylenol.  Additionally, since the pain could be due to fibromyalgia, I will have him try gabapentin.  This should also help with the insomnia.  He will titrate up to a dose of 300 mg in the morning, 300 mg in the p.m. and 600 mg at night.  I advised him that the medications could take a few weeks to help the pain.  If he is no better in 3 to 4 weeks he will give [place] a call and I will set up a  nerve conduction/EMG study to further evaluate for myopathy, radiculopathy or other neuromuscular disorder.  If he is better, he could continue the medications.  Thank you for asking me to see Madeline Larsen.  Please let me know if I can be of further assistance with him or other patients in the future.  Richard A. Felecia Shelling, MD, [facility] 38/04/5051, 9:76 PM Certified in Neurology, Clinical Neurophysiology, Sleep Medicine and Neuroimaging  [facility] [address] [place], [place] [address] [phone]

## 2018-12-28 ENCOUNTER — Other Ambulatory Visit: Payer: Self-pay | Admitting: Rheumatology

## 2018-12-28 ENCOUNTER — Inpatient Hospital Stay (HOSPITAL_COMMUNITY): Payer: No Typology Code available for payment source

## 2018-12-28 ENCOUNTER — Emergency Department (HOSPITAL_COMMUNITY): Payer: No Typology Code available for payment source

## 2018-12-28 ENCOUNTER — Other Ambulatory Visit: Payer: Self-pay | Admitting: *Deleted

## 2018-12-28 ENCOUNTER — Encounter (HOSPITAL_COMMUNITY): Payer: Self-pay | Admitting: Emergency Medicine

## 2018-12-28 ENCOUNTER — Other Ambulatory Visit: Payer: Self-pay

## 2018-12-28 ENCOUNTER — Inpatient Hospital Stay (HOSPITAL_COMMUNITY)
Admission: EM | Admit: 2018-12-28 | Discharge: 2018-12-30 | DRG: 093 | Disposition: A | Discharge status: Home / Self Care | Payer: No Typology Code available for payment source | Attending: Internal Medicine | Admitting: Internal Medicine

## 2018-12-28 DIAGNOSIS — M2550 Pain in unspecified joint: Secondary | ICD-10-CM | POA: Diagnosis not present

## 2018-12-28 DIAGNOSIS — Z79899 Other long term (current) drug therapy: Secondary | ICD-10-CM

## 2018-12-28 DIAGNOSIS — Z8614 Personal history of Methicillin resistant Staphylococcus aureus infection: Secondary | ICD-10-CM

## 2018-12-28 DIAGNOSIS — G92 Toxic encephalopathy: Secondary | ICD-10-CM | POA: Diagnosis not present

## 2018-12-28 DIAGNOSIS — Z789 Other specified health status: Secondary | ICD-10-CM | POA: Diagnosis present

## 2018-12-28 DIAGNOSIS — K219 Gastro-esophageal reflux disease without esophagitis: Secondary | ICD-10-CM | POA: Diagnosis present

## 2018-12-28 DIAGNOSIS — R4182 Altered mental status, unspecified: Secondary | ICD-10-CM | POA: Diagnosis present

## 2018-12-28 DIAGNOSIS — Z818 Family history of other mental and behavioral disorders: Secondary | ICD-10-CM | POA: Diagnosis not present

## 2018-12-28 DIAGNOSIS — M1990 Unspecified osteoarthritis, unspecified site: Secondary | ICD-10-CM | POA: Diagnosis present

## 2018-12-28 DIAGNOSIS — D709 Neutropenia, unspecified: Secondary | ICD-10-CM

## 2018-12-28 DIAGNOSIS — F640 Transsexualism: Secondary | ICD-10-CM | POA: Diagnosis present

## 2018-12-28 DIAGNOSIS — F329 Major depressive disorder, single episode, unspecified: Secondary | ICD-10-CM | POA: Diagnosis present

## 2018-12-28 DIAGNOSIS — G8929 Other chronic pain: Secondary | ICD-10-CM | POA: Diagnosis present

## 2018-12-28 DIAGNOSIS — R768 Other specified abnormal immunological findings in serum: Secondary | ICD-10-CM | POA: Diagnosis present

## 2018-12-28 DIAGNOSIS — Z20828 Contact with and (suspected) exposure to other viral communicable diseases: Secondary | ICD-10-CM | POA: Diagnosis present

## 2018-12-28 DIAGNOSIS — Z8269 Family history of other diseases of the musculoskeletal system and connective tissue: Secondary | ICD-10-CM

## 2018-12-28 DIAGNOSIS — R946 Abnormal results of thyroid function studies: Secondary | ICD-10-CM | POA: Diagnosis present

## 2018-12-28 DIAGNOSIS — M791 Myalgia, unspecified site: Secondary | ICD-10-CM | POA: Diagnosis present

## 2018-12-28 DIAGNOSIS — T426X5A Adverse effect of other antiepileptic and sedative-hypnotic drugs, initial encounter: Secondary | ICD-10-CM | POA: Diagnosis present

## 2018-12-28 DIAGNOSIS — Z8261 Family history of arthritis: Secondary | ICD-10-CM

## 2018-12-28 DIAGNOSIS — F1210 Cannabis abuse, uncomplicated: Secondary | ICD-10-CM | POA: Diagnosis present

## 2018-12-28 DIAGNOSIS — G9340 Encephalopathy, unspecified: Secondary | ICD-10-CM

## 2018-12-28 DIAGNOSIS — R109 Unspecified abdominal pain: Secondary | ICD-10-CM

## 2018-12-28 LAB — CBC WITH DIFFERENTIAL/PLATELET
Abs Immature Granulocytes: 0.01 10*3/uL (ref 0.00–0.07)
Basophils Absolute: 0 10*3/uL (ref 0.0–0.1)
Basophils Relative: 0 %
Eosinophils Absolute: 0 10*3/uL (ref 0.0–0.5)
Eosinophils Relative: 1 %
HCT: 43.8 % (ref 36.0–46.0)
Hemoglobin: 13.6 g/dL (ref 12.0–15.0)
Immature Granulocytes: 0 %
Lymphocytes Relative: 33 %
Lymphs Abs: 1.2 10*3/uL (ref 0.7–4.0)
MCH: 27.6 pg (ref 26.0–34.0)
MCHC: 31.1 g/dL (ref 30.0–36.0)
MCV: 89 fL (ref 80.0–100.0)
Monocytes Absolute: 0.5 10*3/uL (ref 0.1–1.0)
Monocytes Relative: 14 %
Neutro Abs: 1.9 10*3/uL (ref 1.7–7.7)
Neutrophils Relative %: 52 %
Platelets: 238 10*3/uL (ref 150–400)
RBC: 4.92 MIL/uL (ref 3.87–5.11)
RDW: 12.2 % (ref 11.5–15.5)
WBC: 3.6 10*3/uL — ABNORMAL LOW (ref 4.0–10.5)
nRBC: 0 % (ref 0.0–0.2)

## 2018-12-28 LAB — I-STAT BETA HCG BLOOD, ED (MC, WL, AP ONLY): I-stat hCG, quantitative: <5 m[IU]/mL (ref <5)

## 2018-12-28 LAB — VITAMIN B12: Vitamin B-12: 406 pg/mL (ref 180–914)

## 2018-12-28 LAB — URINALYSIS, ROUTINE W REFLEX MICROSCOPIC
Glucose, UA: NEGATIVE mg/dL
Hgb urine dipstick: NEGATIVE
Ketones, ur: NEGATIVE mg/dL
Nitrite: NEGATIVE
Protein, ur: NEGATIVE mg/dL
Specific Gravity, Urine: 1.009 (ref 1.005–1.030)
pH: 6 (ref 5.0–8.0)

## 2018-12-28 LAB — COMPREHENSIVE METABOLIC PANEL
ALT: 15 U/L (ref 0–44)
AST: 23 U/L (ref 15–41)
Albumin: 3.8 g/dL (ref 3.5–5.0)
Alkaline Phosphatase: 50 U/L (ref 38–126)
Anion gap: 10 (ref 5–15)
BUN: 7 mg/dL (ref 6–20)
CO2: 24 mmol/L (ref 22–32)
Calcium: 9.1 mg/dL (ref 8.9–10.3)
Chloride: 104 mmol/L (ref 98–111)
Creatinine, Ser: 0.88 mg/dL (ref 0.44–1.00)
GFR calc Af Amer: >60 mL/min (ref >60)
GFR calc non Af Amer: >60 mL/min (ref >60)
Glucose, Bld: 96 mg/dL (ref 70–99)
Potassium: 4 mmol/L (ref 3.5–5.1)
Sodium: 138 mmol/L (ref 135–145)
Total Bilirubin: 0.4 mg/dL (ref 0.3–1.2)
Total Protein: 7.3 g/dL (ref 6.5–8.1)

## 2018-12-28 LAB — LIPASE, BLOOD: Lipase: 31 U/L (ref 11–51)

## 2018-12-28 LAB — RAPID URINE DRUG SCREEN, HOSP PERFORMED
Amphetamines: NOT DETECTED
Barbiturates: NOT DETECTED
Benzodiazepines: NOT DETECTED
Cocaine: NOT DETECTED
Opiates: NOT DETECTED
Tetrahydrocannabinol: POSITIVE — AB

## 2018-12-28 LAB — HEMOGLOBIN A1C
Hgb A1c MFr Bld: 6.1 % — ABNORMAL HIGH (ref 4.8–5.6)
Mean Plasma Glucose: 128.37 mg/dL

## 2018-12-28 LAB — CBG MONITORING, ED: Glucose-Capillary: 88 mg/dL (ref 70–99)

## 2018-12-28 LAB — MRSA PCR SCREENING: MRSA by PCR: NEGATIVE

## 2018-12-28 LAB — AMMONIA: Ammonia: 31 umol/L (ref 9–35)

## 2018-12-28 LAB — MAGNESIUM: Magnesium: 2 mg/dL (ref 1.7–2.4)

## 2018-12-28 LAB — TSH: TSH: 0.203 u[IU]/mL — ABNORMAL LOW (ref 0.350–4.500)

## 2018-12-28 LAB — ETHANOL: Alcohol, Ethyl (B): <10 mg/dL (ref <10)

## 2018-12-28 LAB — LACTIC ACID, PLASMA: Lactic Acid, Venous: 0.8 mmol/L (ref 0.5–1.9)

## 2018-12-28 LAB — SARS CORONAVIRUS 2 (TAT 6-24 HRS): SARS Coronavirus 2: NEGATIVE

## 2018-12-28 LAB — PHOSPHORUS: Phosphorus: 3.2 mg/dL (ref 2.5–4.6)

## 2018-12-28 MED ORDER — ACETAMINOPHEN 325 MG PO TABS
650.0000 mg | ORAL_TABLET | Freq: Four times a day (QID) | ORAL | Status: DC | PRN
  Administered 2018-12-29 – 2018-12-30 (×2): 650 mg via ORAL
  Filled 2018-12-28 (×2): qty 2

## 2018-12-28 MED ORDER — HYDROXYCHLOROQUINE SULFATE 200 MG PO TABS
200.0000 mg | ORAL_TABLET | Freq: Two times a day (BID) | ORAL | Status: DC
  Administered 2018-12-29 – 2018-12-30 (×3): 200 mg via ORAL
  Filled 2018-12-28 (×4): qty 1

## 2018-12-28 MED ORDER — ENOXAPARIN SODIUM 60 MG/0.6ML ~~LOC~~ SOLN
50.0000 mg | SUBCUTANEOUS | Status: DC
  Administered 2018-12-28 – 2018-12-29 (×2): 50 mg via SUBCUTANEOUS
  Filled 2018-12-28 (×3): qty 0.5

## 2018-12-28 MED ORDER — ONDANSETRON HCL 4 MG/2ML IJ SOLN
4.0000 mg | Freq: Once | INTRAMUSCULAR | Status: AC
  Administered 2018-12-28: 4 mg via INTRAVENOUS
  Filled 2018-12-28: qty 2

## 2018-12-28 MED ORDER — ACETAMINOPHEN 650 MG RE SUPP
650.0000 mg | Freq: Four times a day (QID) | RECTAL | Status: DC | PRN
  Administered 2018-12-28 – 2018-12-29 (×2): 650 mg via RECTAL
  Filled 2018-12-28 (×2): qty 1

## 2018-12-28 MED ORDER — ONDANSETRON HCL 4 MG PO TABS
4.0000 mg | ORAL_TABLET | Freq: Four times a day (QID) | ORAL | Status: DC | PRN
  Administered 2018-12-29: 4 mg via ORAL
  Filled 2018-12-28: qty 1

## 2018-12-28 MED ORDER — ONDANSETRON HCL 4 MG/2ML IJ SOLN: 4.0000 mg | Freq: Four times a day (QID) | INTRAMUSCULAR | Status: DC | PRN

## 2018-12-28 MED ORDER — ENOXAPARIN SODIUM 40 MG/0.4ML ~~LOC~~ SOLN: 40.0000 mg | SUBCUTANEOUS | Status: DC

## 2018-12-28 NOTE — ED Notes (Signed)
RN attempted to call report; RN to call back-Monique,RN  

## 2018-12-28 NOTE — ED Notes (Signed)
Pt noted to no longer be shaking/sweating. Pt remains alert, oriented.

## 2018-12-28 NOTE — Procedures (Signed)
Patient Name: ZOLA RUNION  MRN: [id number]  Epilepsy Attending: Lora Havens  Referring Physician/Provider: Dr Early Osmond Date: 10/208/2020 Duration: 24.01 mins  Patient history: 33yo  Female to female patient with AMS. EEG to evaluate for seizure  Level of alertness: awake, drowsy  AEDs during EEG study: None  Technical aspects: This EEG study was done with scalp electrodes positioned according to the 10-20 International system of electrode placement. Electrical activity was acquired at a sampling rate of 500Hz  and reviewed with a high frequency filter of 70Hz  and a low frequency filter of 1Hz . EEG data were recorded continuously and digitally stored.   DESCRIPTION: he posterior dominant rhythm consists of 10-11 Hz activity of moderate voltage (25-35 uV) seen predominantly in posterior head regions, symmetric and reactive to eye opening and eye closing.    Drowsiness was characterized by attenuation of the posterior background rhythm. Hyperventilation and photic stimulation were not performed.  Multiple episodes of jerking were captured during which patient appeared to have bilateral shoulder jerks. Concomitant eeg didn't show any change to suggest seizure.  IMPRESSION: This study is within normal limits. No seizures or epileptiform discharges were seen throughout the recording.  Multiple episodes of jerking were captured during which patient appeared to have bilateral shoulder jerks. Concomitant eeg didn't show any change to suggest seizure.   Anthonny Schiller Barbra Sarks

## 2018-12-28 NOTE — ED Notes (Signed)
Spouse, Monique, going home but will be back later.  Her cell # updated in chart

## 2018-12-28 NOTE — ED Notes (Signed)
ED TO INPATIENT HANDOFF REPORT  ED Nurse Name and Phone #: Koleen Nimrod [phone]  S Name/Age/Gender Madeline Larsen 33 y.o. adult Room/Bed: 051C/051C  Code Status   Code Status: Full Code  Home/SNF/Other Home Patient oriented to: self, place, time and situation Is this baseline? Yes   Triage Complete: Triage complete  Chief Complaint tremors and htn  Triage Note Patient here with coworker who reports patient not acting right since she arrived at 0900, LKW unknown. Patient appears sleepy and denies pain. Patient states I just don't feel right   Allergies No Known Allergies  Level of Care/Admitting Diagnosis ED Disposition    ED Disposition Condition Comment   Admit  Hospital Area: [facility] [[id number]]  Level of Care: Med-Surg [16]  Covid Evaluation: Asymptomatic Screening Protocol (No Symptoms)  Diagnosis: Altered mental state [218921]  Admitting Physician: Ollen Bowl [[id number]]  Attending Physician: Ollen Bowl [phone]  Estimated length of stay: past midnight tomorrow  Certification:: I certify this patient will need inpatient services for at least 2 midnights  PT Class (Do Not Modify): Inpatient [101]  PT Acc Code (Do Not Modify): Private [1]       B Medical/Surgery History Past Medical History:  Diagnosis Date  . Depression   . Female-to-female transgender person   . GERD (gastroesophageal reflux disease)   . MRSA (methicillin resistant staph aureus) culture positive    Past Surgical History:  Procedure Laterality Date  . FOOT SURGERY Left   . top surgery  02/2018     A IV Location/Drains/Wounds Patient Lines/Drains/Airways Status   Active Line/Drains/Airways    Name:   Placement date:   Placement time:   Site:   Days:   Peripheral IV 12/28/18 Left Antecubital   12/28/18    1015    Antecubital   less than 1          Intake/Output Last 24 hours No intake or output data in the 24 hours ending 12/28/18  2023  Labs/Imaging Results for orders placed or performed during the hospital encounter of 12/28/18 (from the past 48 hour(s))  Comprehensive metabolic panel     Status: None   Collection Time: 12/28/18 10:13 AM  Result Value Ref Range   Sodium 138 135 - 145 mmol/L   Potassium 4.0 3.5 - 5.1 mmol/L   Chloride 104 98 - 111 mmol/L   CO2 24 22 - 32 mmol/L   Glucose, Bld 96 70 - 99 mg/dL   BUN 7 6 - 20 mg/dL   Creatinine, Ser 2.22 0.44 - 1.00 mg/dL   Calcium 9.1 8.9 - 97.9 mg/dL   Total Protein 7.3 6.5 - 8.1 g/dL   Albumin 3.8 3.5 - 5.0 g/dL   AST 23 15 - 41 U/L   ALT 15 0 - 44 U/L   Alkaline Phosphatase 50 38 - 126 U/L   Total Bilirubin 0.4 0.3 - 1.2 mg/dL   GFR calc non Af Amer >60 >60 mL/min   GFR calc Af Amer >60 >60 mL/min   Anion gap 10 5 - 15    Comment: Performed at [facility], [address] [place], [place] [address]  CBC WITH DIFFERENTIAL     Status: Abnormal   Collection Time: 12/28/18 10:13 AM  Result Value Ref Range   WBC 3.6 (L) 4.0 - 10.5 K/uL   RBC 4.92 3.87 - 5.11 MIL/uL   Hemoglobin 13.6 12.0 - 15.0 g/dL   HCT 94.1 74.0 - 81.4 %  MCV 89.0 80.0 - 100.0 fL   MCH 27.6 26.0 - 34.0 pg   MCHC 31.1 30.0 - 36.0 g/dL   RDW 16.1 09.6 - 04.5 %   Platelets 238 150 - 400 K/uL   nRBC 0.0 0.0 - 0.2 %   Neutrophils Relative % 52 %   Neutro Abs 1.9 1.7 - 7.7 K/uL   Lymphocytes Relative 33 %   Lymphs Abs 1.2 0.7 - 4.0 K/uL   Monocytes Relative 14 %   Monocytes Absolute 0.5 0.1 - 1.0 K/uL   Eosinophils Relative 1 %   Eosinophils Absolute 0.0 0.0 - 0.5 K/uL   Basophils Relative 0 %   Basophils Absolute 0.0 0.0 - 0.1 K/uL   Immature Granulocytes 0 %   Abs Immature Granulocytes 0.01 0.00 - 0.07 K/uL    Comment: Performed at [facility], [address], [place], [place] [address]  Ethanol     Status: None   Collection Time: 12/28/18 10:14 AM  Result Value Ref Range   Alcohol, Ethyl (B) <10 <10 mg/dL    Comment: (NOTE) Lowest detectable limit for serum  alcohol is 10 mg/dL. For medical purposes only. Performed at [facility], [address] [place], [place] [address]   Urinalysis, Routine w reflex microscopic     Status: Abnormal   Collection Time: 12/28/18 10:14 AM  Result Value Ref Range   Color, Urine YELLOW YELLOW   APPearance CLEAR CLEAR   Specific Gravity, Urine 1.009 1.005 - 1.030   pH 6.0 5.0 - 8.0   Glucose, UA NEGATIVE NEGATIVE mg/dL   Hgb urine dipstick NEGATIVE NEGATIVE   Bilirubin Urine SMALL (A) NEGATIVE   Ketones, ur NEGATIVE NEGATIVE mg/dL   Protein, ur NEGATIVE NEGATIVE mg/dL   Nitrite NEGATIVE NEGATIVE   Leukocytes,Ua TRACE (A) NEGATIVE   RBC / HPF 0-5 0 - 5 RBC/hpf   WBC, UA 6-10 0 - 5 WBC/hpf   Bacteria, UA RARE (A) NONE SEEN   Mucus PRESENT     Comment: Performed at [facility], [address], [place], [place] [address]  Urine rapid drug screen (hosp performed)     Status: Abnormal   Collection Time: 12/28/18 10:15 AM  Result Value Ref Range   Opiates NONE DETECTED NONE DETECTED   Cocaine NONE DETECTED NONE DETECTED   Benzodiazepines NONE DETECTED NONE DETECTED   Amphetamines NONE DETECTED NONE DETECTED   Tetrahydrocannabinol POSITIVE (A) NONE DETECTED   Barbiturates NONE DETECTED NONE DETECTED    Comment: (NOTE) DRUG SCREEN FOR MEDICAL PURPOSES ONLY.  IF CONFIRMATION IS NEEDED FOR ANY PURPOSE, NOTIFY LAB WITHIN 5 DAYS. LOWEST DETECTABLE LIMITS FOR URINE DRUG SCREEN Drug Class                     Cutoff (ng/mL) Amphetamine and metabolites    1000 Barbiturate and metabolites    200 Benzodiazepine                 200 Tricyclics and metabolites     300 Opiates and metabolites        300 Cocaine and metabolites        300 THC                            50 Performed at [facility], [address], [place], [place] [address]   Lactic acid, plasma     Status: None  Collection Time: 12/28/18 10:16 AM  Result Value Ref Range   Lactic Acid, Venous 0.8 0.5 - 1.9 mmol/L     Comment: Performed at [facility], [address], [place], [place] [address]  I-Stat beta hCG blood, ED     Status: None   Collection Time: 12/28/18 10:50 AM  Result Value Ref Range   I-stat hCG, quantitative <5.0 <5 mIU/mL   Comment 3            Comment:   GEST. AGE      CONC.  (mIU/mL)   <=1 WEEK        5 - 50     2 WEEKS       50 - 500     3 WEEKS       100 - 10,000     4 WEEKS     1,000 - 30,000        FEMALE AND NON-PREGNANT FEMALE:     LESS THAN 5 mIU/mL   TSH     Status: Abnormal   Collection Time: 12/28/18 12:05 PM  Result Value Ref Range   TSH 0.203 (L) 0.350 - 4.500 uIU/mL    Comment: Performed by a 3rd Generation assay with a functional sensitivity of <=0.01 uIU/mL. Performed at [facility], [place] [address] [place], [place] [address]   SARS CORONAVIRUS 2 (TAT 6-24 HRS) Nasopharyngeal Nasopharyngeal Swab     Status: None   Collection Time: 12/28/18  2:35 PM   Specimen: Nasopharyngeal Swab  Result Value Ref Range   SARS Coronavirus 2 NEGATIVE NEGATIVE    Comment: (NOTE) SARS-CoV-2 target nucleic acids are NOT DETECTED. The SARS-CoV-2 RNA is generally detectable in upper and lower respiratory specimens during the acute phase of infection. Negative results do not preclude SARS-CoV-2 infection, do not rule out co-infections with other pathogens, and should not be used as the sole basis for treatment or other patient management decisions. Negative results must be combined with clinical observations, patient history, and epidemiological information. The expected result is Negative. Fact Sheet for Patients: SugarRoll.be Fact Sheet for Healthcare Providers: https://www.woods-mathews.com/ This test is not yet approved or cleared by the [place] FDA and  has been authorized for detection and/or diagnosis of SARS-CoV-2 by FDA under an Emergency Use Authorization (EUA). This EUA will remain  in effect (meaning this  test can be used) for the duration of the COVID-19 declaration under Section 56 4(b)(1) of the Act, 21 U.S.C. section 360bbb-3(b)(1), unless the authorization is terminated or revoked sooner. Performed at [facility], [place] [address], [place], [place] [address]   CBG monitoring, ED     Status: None   Collection Time: 12/28/18  5:34 PM  Result Value Ref Range   Glucose-Capillary 88 70 - 99 mg/dL   Comment 1 Notify RN    Comment 2 Document in Chart    Ct Head Wo Contrast  Result Date: 12/28/2018 CLINICAL DATA:  33 year old female with altered mental status. EXAM: CT HEAD WITHOUT CONTRAST TECHNIQUE: Contiguous axial images were obtained from the base of the skull through the vertex without intravenous contrast. COMPARISON:  Brain MRI dated 04/06/2014 FINDINGS: Brain: No evidence of acute infarction, hemorrhage, hydrocephalus, extra-axial collection or mass lesion/mass effect. Vascular: No hyperdense vessel or unexpected calcification. Skull: Normal. Negative for fracture or focal lesion. Sinuses/Orbits: No acute finding. Other: None IMPRESSION: Normal noncontrast CT of the brain. Electronically Signed   By: Laren Everts.D.  On: 12/28/2018 13:27   US Abdomen Complete  Result Date: 12/28/2018 CLINICAL DATA:  Diffuse, chronic abdominal pain. EXAM: ABDOMEN ULTRASOUND COMPLETE COMPARISON:  CT abdomen and pelvis 02/16/2017 abdominal ultrasound 08/31/2013. FINDINGS: Gallbladder: No gallstones or wall thickening visualized. No sonographic Murphy sign noted by sonographer. Common bile duct: Diameter: 0.4 cm Liver: No focal lesion identified. Within normal limits in parenchymal echogenicity. Portal vein is patent on color Doppler imaging with normal direction of blood flow towards the liver. IVC: No abnormality visualized. Pancreas: Visualized portion unremarkable. Spleen: Size and appearance within normal limits. Right Kidney: Length: 10.3 cm. Echogenicity within normal limits. No mass or  hydronephrosis visualized. Left Kidney: Length: 10.1 cm. Echogenicity within normal limits. No mass or hydronephrosis visualized. Abdominal aorta: No aneurysm visualized. Other findings: None. IMPRESSION: Normal examination. Electronically Signed   By: Drusilla Kanner M.D.   On: 12/28/2018 15:13    Pending Labs Unresulted Labs (From admission, onward)    Start     Ordered   01/04/19 0500  Creatinine, serum  (enoxaparin (LOVENOX)    CrCl >/= 30 ml/min)  Weekly,   R    Comments: while on enoxaparin therapy    12/28/18 1434   12/28/18 1623  Vitamin B12  Add-on,   AD     12/28/18 1622   12/28/18 1623  RPR  Add-on,   AD     12/28/18 1622   12/28/18 1622  Ammonia  Add-on,   AD     12/28/18 1622   12/28/18 1435  Lipase, blood  Add-on,   AD     12/28/18 1434   12/28/18 1434  Magnesium  Add-on,   AD     12/28/18 1434   12/28/18 1434  Phosphorus  Add-on,   AD     12/28/18 1434   12/28/18 1434  Hemoglobin A1c  Add-on,   AD     12/28/18 1434          Vitals/Pain Today's Vitals   12/28/18 1746 12/28/18 2012 12/28/18 2017 12/28/18 2018  BP:    124/81  Pulse:   66 66  Resp:   16 16  Temp: 98.1 F (36.7 C)     TempSrc: Rectal     SpO2:   99% 99%  Weight:      Height:      PainSc:  3       Isolation Precautions No active isolations  Medications Medications  acetaminophen (TYLENOL) tablet 650 mg (has no administration in time range)    Or  acetaminophen (TYLENOL) suppository 650 mg (has no administration in time range)  ondansetron (ZOFRAN) tablet 4 mg (has no administration in time range)    Or  ondansetron (ZOFRAN) injection 4 mg (has no administration in time range)  enoxaparin (LOVENOX) injection 50 mg (50 mg Subcutaneous Given 12/28/18 2003)  hydroxychloroquine (PLAQUENIL) tablet 200 mg (has no administration in time range)  ondansetron (ZOFRAN) injection 4 mg (4 mg Intravenous Given 12/28/18 1230)    Mobility walks Moderate fall risk   Focused Assessments Neuro  Assessment Handoff:  Swallow screen pass? No  Cardiac Rhythm: Normal sinus rhythm NIH Stroke Scale ( + Modified Stroke Scale Criteria)  Interval: Initial Level of Consciousness (1a.)   : Not alert, but arousable by minor stimulation to obey, answer, or respond LOC Questions (1b. )   +: Answers both questions correctly LOC Commands (1c. )   + : Performs both tasks correctly Best Gaze (2. )  +: Normal Visual (3. )  +:  No visual loss Facial Palsy (4. )    : Normal symmetrical movements Motor Arm, Left (5a. )   +: No drift Motor Arm, Right (5b. )   +: No drift Motor Leg, Left (6a. )   +: No drift Motor Leg, Right (6b. )   +: No drift Limb Ataxia (7. ): Absent Sensory (8. )   +: Normal, no sensory loss Best Language (9. )   +: No aphasia Dysarthria (10. ): Normal Extinction/Inattention (11.)   +: No Abnormality Modified SS Total  +: 0 Complete NIHSS TOTAL: 1     Neuro Assessment: Within Defined Limits Neuro Checks:   Initial (12/28/18 0959)  Last Documented NIHSS Modified Score: 0 (12/28/18 0959) Has TPA been given? No If patient is a Neuro Trauma and patient is going to OR before floor call report to 4N Charge nurse: [phone] or [phone]     R Recommendations: See Admitting Provider Note  Report given to:   Additional Notes: Patient has been kept NPO until fully alert and MRI is complete; MRI has 2 more ahead of patient so they will get patient from the floor; Pt c/o slight HA at 3/10; Vitals WNL; RN has not attempted to ambulate patient; Patient is normally on high dosage of Gabapentin for past nero hx (see notes in chart)  but Admitting has not ordered, patient asking about medications; Patient only given Lovenox shot at this time; FYI: patient is transgender female -Koleen NimrodMonique,RN

## 2018-12-28 NOTE — Telephone Encounter (Signed)
Last visit: 12/14/18 Next Visit: due March 2021. Message sent to the front to schedule patient.  Labs: 12/02/18 CMP WNL. WBC count is low PLQ Eye Exam: no baseline PLQ eye exam on file  Left message to advise patient will need PLQ eye exam.  Okay to refill PLQ?

## 2018-12-28 NOTE — ED Provider Notes (Signed)
[facility] Provider Note   CSN: [id number] Arrival date & time: 12/28/18  0920     History   Chief Complaint Chief Complaint  Patient presents with  . Nausea  . Near Syncope    HPI Madeline Larsen is a 33 y.o. adult.     Patient who has chronic joint pain and follows with rheumatology with a positive ANA, was recently placed on gabapentin for pain by neurology --presents to the emergency department today with altered mental status.  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  Coworker notified the patient's wife at approximately 830 that patient was not acting normally.  BP was noted to be elevated in the 190 systolic range at work.  He reportedly woke up in normal state of health.  Patient states that he just does not feel right.  He is not able to give more details.  No reported recent illness or viral illness.  No reported headache.  Patient denies vision changes but some difficulty focusing.  No recent URI symptoms.  No chest pain or shortness of breath.  Patient states that he has nausea but has not had any vomiting or diarrhea.  No urinary symptoms.  He reports generalized weakness all over his body, no unilateral weakness.  Speech is slowed but not slurred.  Denies any recent immunizations, vaccines, toxic exposures.     Past Medical History:  Diagnosis Date  . Depression   . Female-to-female transgender person   . GERD (gastroesophageal reflux disease)   . MRSA (methicillin resistant staph aureus) culture positive     Patient Active Problem List   Diagnosis Date Noted  . Myalgia 12/16/2018  . Polyarthralgia 12/16/2018  . ANA positive 12/16/2018  . Neutropenia (HCC) 01/27/2018  . 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 Headache 06/18/2017  . 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 Status post left foot surgery 04/09/2015  . 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 FOOT SURGERY Left   . top surgery  02/2018     OB History   No obstetric history on file.      Home Medications    Prior to Admission medications   Medication Sig Start Date End Date Taking? Authorizing Provider  acetaminophen (TYLENOL) 500 MG tablet Take 1,000 mg by mouth every 6 (six) hours as needed for pain.    [provider]  ALPRAZolam Prudy Feeler(XANAX) 1 MG tablet Take 1 mg by mouth at bedtime as needed. for sleep 02/03/17   [provider]  etodolac (LODINE) 400 MG tablet Take 1 tablet (400 mg total) by mouth 2 (two) times daily. 12/16/18   Sater, Pearletha Furlichard A, MD  gabapentin (NEURONTIN) 300 MG capsule One po qAM; one po qPM and two po qHS 12/16/18   Sater, Pearletha Furlichard A, MD  hydroxychloroquine (PLAQUENIL) 200 MG tablet Take 1 tablet (200 mg total) by mouth 2 (two) times daily. 10/06/18   Pollyann Savoyeveshwar, Shaili, MD  hyoscyamine (LEVSIN, ANASPAZ) 0.125 MG tablet Take 1 tablet (0.125 mg total) by mouth every 6 (six) hours as needed for cramping. 07/17/17   Nche, Bonna Gainsharlotte Lum, NP  ibuprofen (ADVIL,MOTRIN) 200 MG tablet Take by mouth as needed.     [provider]  lactulose (CHRONULAC) 10 GM/15ML solution Take 30 mLs (20 g total) by mouth daily as needed for severe constipation. 05/03/18   Nche, Bonna Gainsharlotte Lum, NP  omeprazole (PRILOSEC) 20 MG capsule Take 1  capsule (20 mg total) by mouth daily. Patient taking differently: Take 20 mg by mouth as needed.  07/02/17   Nche, Bonna Gains, NP  ondansetron (ZOFRAN ODT) 8 MG disintegrating tablet Take 1 tablet (8 mg total) by mouth every 8 (eight) hours as needed for nausea or vomiting. 12/02/18   Everrett Coombe, DO  senna-docusate (SENOKOT-S) 8.6-50 MG tablet Take 1 tablet by mouth at bedtime. 07/02/17   Nche, Bonna Gains, NP  testosterone cypionate (DEPOTESTOSTERONE CYPIONATE) 200 MG/ML injection Inject 0.4 mLs (80 mg total) into the muscle once a week. 11/19/18   Romero Belling, MD    Family History Family History  Problem Relation  Age of Onset  . Alcohol abuse Mother   . Cancer Mother 42       Uterine cancer  . Drug abuse Mother   . HIV/AIDS Mother   . Early death Mother 33       uterine cancer  . Alcohol abuse Father   . Drug abuse Father   . Early death Father 67       muscular dystrophy  . Muscular dystrophy Father   . Alcohol abuse Sister   . Depression Sister   . Drug abuse Sister   . Depression Brother   . Kidney disease Maternal Grandmother        ESRD on dialysis  . Hypertension Maternal Grandmother   . Rheum arthritis Paternal Grandmother   . Rheum arthritis Brother   . Depression Sister   . Depression Sister   . Depression Sister   . Lupus Cousin   . Lupus Cousin   . Lupus Cousin     Social History Social History   Tobacco Use  . Smoking status: Never Smoker  . Smokeless tobacco: Never Used  Substance Use Topics  . Alcohol use: Yes    Alcohol/week: 0.0 standard drinks    Comment: socially  . Drug use: Not Currently    Types: Marijuana     Allergies   Patient has no known allergies.   Review of Systems Review of Systems  Constitutional: Negative for fever.  HENT: Negative for rhinorrhea and sore throat.   Eyes: Negative for photophobia, redness and visual disturbance.  Respiratory: Negative for cough and shortness of breath.   Cardiovascular: Negative for chest pain.  Gastrointestinal: Positive for nausea. Negative for abdominal pain, diarrhea and vomiting.  Genitourinary: Negative for dysuria.  Musculoskeletal: Negative for myalgias.  Skin: Negative for rash.  Neurological: Positive for tremors and speech difficulty. Negative for headaches.     Physical Exam Updated Vital Signs BP (!) 157/79 (BP Location: Right Arm)   Pulse 67   Temp 98.6 F (37 C)   Resp 14   Ht  (1.575 m)   Wt 99.8 kg   SpO2 100%   BMI 40.24 kg/m   Physical Exam Vitals signs and nursing note reviewed.  Constitutional:      Appearance: He is well-developed.  HENT:     Head:  Normocephalic and atraumatic.     Right Ear: Tympanic membrane, ear canal and external ear normal.     Left Ear: Tympanic membrane, ear canal and external ear normal.     Nose: Nose normal.     Mouth/Throat:     Pharynx: Uvula midline.  Eyes:     General: Lids are normal.     Conjunctiva/sclera: Conjunctivae normal.     Pupils: Pupils are equal, round, and reactive to light.  Neck:     Musculoskeletal:  Normal range of motion and neck supple.  Cardiovascular:     Rate and Rhythm: Normal rate and regular rhythm.  Pulmonary:     Effort: Pulmonary effort is normal.     Breath sounds: Normal breath sounds.  Abdominal:     Palpations: Abdomen is soft.     Tenderness: There is no abdominal tenderness.  Musculoskeletal: Normal range of motion.     Cervical back: He exhibits normal range of motion, no tenderness and no bony tenderness.  Skin:    General: Skin is warm and dry.  Neurological:     Mental Status: He is alert and oriented to person, place, and time.     GCS: GCS eye subscore is 4. GCS verbal subscore is 5. GCS motor subscore is 6.     Cranial Nerves: No cranial nerve deficit.     Sensory: No sensory deficit.     Motor: No abnormal muscle tone.     Coordination: Coordination normal.     Deep Tendon Reflexes: Reflexes are normal and symmetric.  Psychiatric:        Attention and Perception: He does not perceive visual hallucinations.        Behavior: Behavior is slowed.      ED Treatments / Results  Labs (all labs ordered are listed, but only abnormal results are displayed) Labs Reviewed  CBC WITH DIFFERENTIAL/PLATELET - Abnormal; Notable for the following components:      Result Value   WBC 3.6 (*)    All other components within normal limits  URINALYSIS, ROUTINE W REFLEX MICROSCOPIC - Abnormal; Notable for the following components:   Bilirubin Urine SMALL (*)    Leukocytes,Ua TRACE (*)    Bacteria, UA RARE (*)    All other components within normal limits  RAPID  URINE DRUG SCREEN, HOSP PERFORMED - Abnormal; Notable for the following components:   Tetrahydrocannabinol POSITIVE (*)    All other components within normal limits  COMPREHENSIVE METABOLIC PANEL  ETHANOL  LACTIC ACID, PLASMA  TSH  I-STAT BETA HCG BLOOD, ED ([facility], [facility], [facility] ONLY)    EKG EKG Interpretation  Date/Time:  Tuesday December 28 2018 10:48:55 EDT Ventricular Rate:  57 PR Interval:    QRS Duration: 92 QT Interval:  422 QTC Calculation: 411 R Axis:   34 Text Interpretation:  Sinus rhythm Probable left atrial enlargement Confirmed by Benjiman Core [phone]) on 12/28/2018 11:08:35 AM   Radiology Ct Head Wo Contrast  Result Date: 12/28/2018 CLINICAL DATA:  33 year old female with altered mental status. EXAM: CT HEAD WITHOUT CONTRAST TECHNIQUE: Contiguous axial images were obtained from the base of the skull through the vertex without intravenous contrast. COMPARISON:  Brain MRI dated 04/06/2014 FINDINGS: Brain: No evidence of acute infarction, hemorrhage, hydrocephalus, extra-axial collection or mass lesion/mass effect. Vascular: No hyperdense vessel or unexpected calcification. Skull: Normal. Negative for fracture or focal lesion. Sinuses/Orbits: No acute finding. Other: None IMPRESSION: Normal noncontrast CT of the brain. Electronically Signed   By: Elgie Collard M.D.   On: 12/28/2018 13:27    Procedures Procedures (including critical care time)  Medications Ordered in ED Medications  acetaminophen (TYLENOL) tablet 650 mg (has no administration in time range)    Or  acetaminophen (TYLENOL) suppository 650 mg (has no administration in time range)  ondansetron (ZOFRAN) tablet 4 mg (has no administration in time range)    Or  ondansetron (ZOFRAN) injection 4 mg (has no administration in time range)  enoxaparin (LOVENOX) injection 50 mg (has no administration  in time range)  ondansetron (ZOFRAN) injection 4 mg (4 mg Intravenous Given 12/28/18 1230)     Initial  Impression / Assessment and Plan / ED Course  I have reviewed the triage vital signs and the nursing notes.  Pertinent labs & imaging results that were available during my care of the patient were reviewed by me and considered in my medical decision making (see chart for details).  Clinical Course as of Dec 27 1512  Tue Dec 28, 2018  1109 Comment 3:        [CA]  1148 EKG 12-Lead [CA]    Clinical Course User Index [CA] [place], [place], [place]        Patient seen and examined.  Unclear etiology of the patient's symptoms today.  No unilateral or focal symptoms to suggest stroke.  Patient without any headache.  No signs of infection or meningitis/encephalitis.  Labs pending.   Vital signs reviewed and are as follows: BP (!) 157/79 (BP Location: Right Arm)   Pulse 67   Temp 98.6 F (37 C)   Resp 14   Ht 5\' 2"  (1.575 m)   Wt 99.8 kg   SpO2 100%   BMI 40.24 kg/m   12:26 PM Patient rechecked. Still reports feeling bad. Work-up reassuring.  He was ambulated.  Per nurse tech note stated ambulated with assistance but felt weak.  Family member at bedside stated that patient was sort of dragging his right leg when putting it down on the ground.  On reexamination, no upper extremity deficits.  If anything, there is trace weakness in the patient's left leg.  Head CT ordered.  Patient discussed with Dr. Alvino Chapel.   2:12 PM Spoke with Dr. Doristine Bosworth of [facility] who will see. Requests call to neuro for opinion. Neuro paged.   3:14 PM No callback from neuro yet.   Final Clinical Impressions(s) / ED Diagnoses   Final diagnoses:  Encephalopathy   Admit for encephalopathy of unknown etiology.  ED Discharge Orders    None       Carlisle Cater, Hershal Coria 12/28/18 1655    Davonna Belling, MD 12/29/18 [phone]

## 2018-12-28 NOTE — Progress Notes (Signed)
EEG complete - results pending 

## 2018-12-28 NOTE — Telephone Encounter (Signed)
Ok to refill 

## 2018-12-28 NOTE — ED Notes (Signed)
EEG being performed.

## 2018-12-28 NOTE — Telephone Encounter (Signed)
Please schedule patient for a follow up visit. Patient is due March 2021. Thanks!

## 2018-12-28 NOTE — ED Triage Notes (Addendum)
Patient here with coworker who reports patient not acting right since she arrived at 0900, LKW unknown. Patient appears sleepy and denies pain. Patient states I just don't feel right

## 2018-12-28 NOTE — ED Notes (Signed)
Spouse at bedside

## 2018-12-28 NOTE — H&P (Addendum)
History and Physical    Madeline Larsen WOE:[id number] DOB: 1985/04/26 DOA: 12/28/2018  PCP: Flossie Buffy, NP  Patient coming from: Home  I have personally briefly reviewed patient's old medical records in [place]  Chief Complaint: Altered mental status.  HPI: Madeline Larsen is a 33 y.o. adult-female to female transgender female in with medical history significant of polyarthralgia and myalgia, GERD, positive ANA and osteoarthritis presents to emergency department due to sudden onset of altered mental status.  His wife is the historian who reports that patient went to work this morning and his coworkers notified that patient was not acting normally.  His blood pressure was noted to be very high in 180/80.  Had slurred speech, with rolling up of eyes, unable to recognize his wife, unable to walk and was very confused and not like himself-patient brought to the emergency department for the concern of altered mental status.  Reports chronic abdominal pain however no history of recent fever, chills, nausea, vomiting, diarrhea.  Denies headache, blurry vision, recent COVID-19 exposure, chest pain, shortness of breath, palpitation, leg swelling, melena, hematemesis, lightheadedness, dizziness, seizures, loss of consciousness, head trauma or fall.  He is followed by rheumatologist outpatient for ANA positive polyarthralgia.  He takes hydroxychloroquine 200 mg p.o. twice daily.  Never diagnosed with lupus.  Family history is positive for lupus in her first 3 cousins.  Takes testosterone once in a week.  He recently seen by neurologist due to muscle pain and back pain and was placed on gabapentin 300 mg in the morning, 300 mg in the p.m. and 600 mg at bedtime.  He was also started on etodolic 003 mg p.o. twice daily.   He lives with his wife at home, denies smoking, alcohol, illicit drug use.   ED Course: Upon arrival: Patient's blood pressure was elevated, lactic acid: WNL, CBC shows  chronic leukopenia, CMP: WNL.  Ethanol: WNL.  UDS positive for marijuana.  UA positive for leukocytes.  TSH is mildly low.  CT head without contrast came back negative.  Review of Systems: As per HPI otherwise negative.    Past Medical History:  Diagnosis Date   Depression    Female-to-female transgender person    GERD (gastroesophageal reflux disease)    MRSA (methicillin resistant staph aureus) culture positive     Past Surgical History:  Procedure Laterality Date   FOOT SURGERY Left    top surgery  02/2018     reports that he has never smoked. He has never used smokeless tobacco. He reports current alcohol use. He reports previous drug use. Drug: Marijuana.  No Known Allergies  Family History  Problem Relation Age of Onset   Alcohol abuse Mother    Cancer Mother 54       Uterine cancer   Drug abuse Mother    HIV/AIDS Mother    Early death Mother 76       uterine cancer   Alcohol abuse Father    Drug abuse Father    Early death Father 50       muscular dystrophy   Muscular dystrophy Father    Alcohol abuse Sister    Depression Sister    Drug abuse Sister    Depression Brother    Kidney disease Maternal Grandmother        ESRD on dialysis   Hypertension Maternal Grandmother    Rheum arthritis Paternal Grandmother    Rheum arthritis Brother    Depression Sister  Depression Sister    Depression Sister    Lupus Cousin    Lupus Cousin    Lupus Cousin     Prior to Admission medications   Medication Sig Start Date End Date Taking? Authorizing Provider  acetaminophen (TYLENOL) 500 MG tablet Take 1,000 mg by mouth every 6 (six) hours as needed for pain.    [provider]  ALPRAZolam Duanne Moron) 1 MG tablet Take 1 mg by mouth at bedtime as needed. for sleep 02/03/17   [provider]  etodolac (LODINE) 400 MG tablet Take 1 tablet (400 mg total) by mouth 2 (two) times daily. 12/16/18   Sater, Nanine Means, MD  gabapentin  (NEURONTIN) 300 MG capsule One po qAM; one po qPM and two po qHS 12/16/18   Sater, Nanine Means, MD  hydroxychloroquine (PLAQUENIL) 200 MG tablet TAKE 1 TABLET BY MOUTH TWICE A DAY 12/28/18   Bo Merino, MD  hyoscyamine (LEVSIN, ANASPAZ) 0.125 MG tablet Take 1 tablet (0.125 mg total) by mouth every 6 (six) hours as needed for cramping. 07/17/17   Nche, Charlene Brooke, NP  ibuprofen (ADVIL,MOTRIN) 200 MG tablet Take by mouth as needed.     [provider]  lactulose (CHRONULAC) 10 GM/15ML solution Take 30 mLs (20 g total) by mouth daily as needed for severe constipation. 05/03/18   Nche, Charlene Brooke, NP  omeprazole (PRILOSEC) 20 MG capsule Take 1 capsule (20 mg total) by mouth daily. Patient taking differently: Take 20 mg by mouth as needed.  07/02/17   Nche, Charlene Brooke, NP  ondansetron (ZOFRAN ODT) 8 MG disintegrating tablet Take 1 tablet (8 mg total) by mouth every 8 (eight) hours as needed for nausea or vomiting. 12/02/18   Luetta Nutting, DO  senna-docusate (SENOKOT-S) 8.6-50 MG tablet Take 1 tablet by mouth at bedtime. 07/02/17   Nche, Charlene Brooke, NP  testosterone cypionate (DEPOTESTOSTERONE CYPIONATE) 200 MG/ML injection Inject 0.4 mLs (80 mg total) into the muscle once a week. 11/19/18   Renato Shin, MD    Physical Exam: Vitals:   12/28/18 0955 12/28/18 1000 12/28/18 1204 12/28/18 1230  BP:  (!) 154/89 135/72 (!) 152/92  Pulse:  62 68   Resp:  '12 16 18  ' Temp:   98.3 F (36.8 C)   TempSrc:   Oral   SpO2:  100% 100%   Weight: 99.8 kg     Height: '5\' 2"'  (1.575 m)       Constitutional: NAD, calm, comfortable Vitals:   12/28/18 0955 12/28/18 1000 12/28/18 1204 12/28/18 1230  BP:  (!) 154/89 135/72 (!) 152/92  Pulse:  62 68   Resp:  '12 16 18  ' Temp:   98.3 F (36.8 C)   TempSrc:   Oral   SpO2:  100% 100%   Weight: 99.8 kg     Height: '5\' 2"'  (1.575 m)      Constitutional: Alert and oriented x3, not in acute distress, communicating but takes times to answer the simple  questions.   Eyes: PERRL, lids and conjunctivae normal ENMT: Mucous membranes are moist. Posterior pharynx clear of any exudate or lesions.Normal dentition.  Neck: normal, supple, no masses, no thyromegaly Respiratory: clear to auscultation bilaterally, no wheezing, no crackles. Normal respiratory effort. No accessory muscle use.  Cardiovascular: Regular rate and rhythm, no murmurs / rubs / gallops. No extremity edema. 2+ pedal pulses. No carotid bruits.  Abdomen: Generalized mild abdominal tenderness positive, no guarding, no rigidity., no masses palpated. No hepatosplenomegaly. Bowel sounds positive.  Musculoskeletal: no clubbing / cyanosis. No joint deformity upper and lower extremities. Good ROM, no contractures. Normal muscle tone.  Skin: no rashes, lesions, ulcers. No induration Neurologic: CN 2-12 grossly intact. Sensation intact, DTR normal. Strength 5/5 in all 4.      Labs on Admission: I have personally reviewed following labs and imaging studies  CBC: Recent Labs  Lab 12/28/18 1013  WBC 3.6*  NEUTROABS 1.9  HGB 13.6  HCT 43.8  MCV 89.0  PLT 784   Basic Metabolic Panel: Recent Labs  Lab 12/28/18 1013  NA 138  K 4.0  CL 104  CO2 24  GLUCOSE 96  BUN 7  CREATININE 0.88  CALCIUM 9.1   GFR: Estimated Creatinine Clearance (by C-G formula based on SCr of 0.88 mg/dL) Female: 99.5 mL/min Female: 121.6 mL/min Liver Function Tests: Recent Labs  Lab 12/28/18 1013  AST 23  ALT 15  ALKPHOS 50  BILITOT 0.4  PROT 7.3  ALBUMIN 3.8   No results for input(s): LIPASE, AMYLASE in the last 168 hours. No results for input(s): AMMONIA in the last 168 hours. Coagulation Profile: No results for input(s): INR, PROTIME in the last 168 hours. Cardiac Enzymes: No results for input(s): CKTOTAL, CKMB, CKMBINDEX, TROPONINI in the last 168 hours. BNP (last 3 results) No results for input(s): PROBNP in the last 8760 hours. HbA1C: No results for input(s): HGBA1C in the last 72  hours. CBG: No results for input(s): GLUCAP in the last 168 hours. Lipid Profile: No results for input(s): CHOL, HDL, LDLCALC, TRIG, CHOLHDL, LDLDIRECT in the last 72 hours. Thyroid Function Tests: Recent Labs    12/28/18 1205  TSH 0.203*   Anemia Panel: No results for input(s): VITAMINB12, FOLATE, FERRITIN, TIBC, IRON, RETICCTPCT in the last 72 hours. Urine analysis:    Component Value Date/Time   COLORURINE YELLOW 12/28/2018 1014   APPEARANCEUR CLEAR 12/28/2018 1014   LABSPEC 1.009 12/28/2018 1014   PHURINE 6.0 12/28/2018 1014   GLUCOSEU NEGATIVE 12/28/2018 1014   HGBUR NEGATIVE 12/28/2018 1014   BILIRUBINUR SMALL (A) 12/28/2018 [place] 12/28/2018 1014   PROTEINUR NEGATIVE 12/28/2018 1014   UROBILINOGEN 1.0 09/18/2016 1800   NITRITE NEGATIVE 12/28/2018 1014   LEUKOCYTESUR TRACE (A) 12/28/2018 1014    Radiological Exams on Admission: Ct Head Wo Contrast  Result Date: 12/28/2018 CLINICAL DATA:  33 year old female with altered mental status. EXAM: CT HEAD WITHOUT CONTRAST TECHNIQUE: Contiguous axial images were obtained from the base of the skull through the vertex without intravenous contrast. COMPARISON:  Brain MRI dated 04/06/2014 FINDINGS: Brain: No evidence of acute infarction, hemorrhage, hydrocephalus, extra-axial collection or mass lesion/mass effect. Vascular: No hyperdense vessel or unexpected calcification. Skull: Normal. Negative for fracture or focal lesion. Sinuses/Orbits: No acute finding. Other: None IMPRESSION: Normal noncontrast CT of the brain. Electronically Signed   By: Anner Crete M.D.   On: 12/28/2018 13:27    EKG: Normal sinus rhythm, no ST elevation or depression noted.  Assessment/Plan Principal Problem:   Altered mental state Active Problems:   Female-to-female transgender person   Neutropenia (HCC)   Polyarthralgia   ANA positive   GERD (gastroesophageal reflux disease)   Marijuana abuse    Altered mental  status: -Unknown etiology?Marland Kitchen  Acute onset.  Could be due to recent addition of gabapentin?.  CT head without contrast came back negative. -Ethanol: WNL, UDS positive for marijuana, lactic acid: WNL.  Normal electrolytes.  UA positive for leukocyte but denies any urinary symptoms.  TSH: Low (  chronic) -We will admit patient for close monitoring.  Neurochecks every 4 hour -We will keep him n.p.o. till he passes the bedside swallow evaluation. -Talked to Neurologist PA-recommended-encephalopathy work-up, MRI brain and EEG-call neurology back if MRI or EEG comes back positive.  Ordered ammonia, B12, RPR. -Consulted PT/ST/OT.  Polyarthralgia/myalgia: -Followed by her rheumatologist outpatient. -Continue hydroxychloroquine 200 mg p.o. twice daily. -Reviewed labs: ESR, CBC, CMP, RF, HLAB27, CCP, Scl-70/ENA, C3/C4, Beta-2 glycoprotein; anti-cardiolipin; lupus anticoagulant; G6PDH, HepB, HepC were all negative -Evaluated by neurology 2 weeks ago-was started on gabapentin and etodolic.  Will hold gabapentin for now.  Abdominal pain: -Chronic as per patient.  Will check lipase and ultrasound abdomen  Female to female transgender person: Aware -Takes testosterone once in a week.  Chronic neutropenia: Noted -Monitor for now.  Marijuana abuse: -UDS noted to be positive for marijuana.   DVT prophylaxis: TED/SCD/Lovenox  code Status: Full code  family Communication: Wife present at bedside.  Plan of care discussed with patient's wife in length and she verbalized understanding and agreed with it. Disposition Plan: TBD Consults called: None Admission status: Inpatient.  Mckinley Jewel MD [facility] Pager [phone]  If 7PM-7AM, please contact night-coverage [email or web address] Password [facility]  12/28/2018, 2:38 PM

## 2018-12-28 NOTE — ED Notes (Signed)
Pt able to ambulate with assistance.  Pt was unsteady and stated he felt weak.

## 2018-12-28 NOTE — Telephone Encounter (Signed)
Called patient to schedule follow-up appointment with Dr. Estanislado Pandy.  Spoke with Beckie Busing who stated patient has been admitted to the hospital and will have him call back to schedule.

## 2018-12-28 NOTE — Telephone Encounter (Signed)
Reviewed ED notes

## 2018-12-28 NOTE — ED Notes (Signed)
William Hamburger, RN

## 2018-12-28 NOTE — ED Notes (Signed)
After EEG completed, pt requested to ambulate to bathroom - pt noted to be shaky and sweaty upon sitting up on side of bed. Pt denies feeling cold. CBG 88. States last meal was yesterday evening. VSS. Checking rectal temp. Pt noted to be alert and oriented.

## 2018-12-29 ENCOUNTER — Inpatient Hospital Stay (HOSPITAL_COMMUNITY): Payer: No Typology Code available for payment source

## 2018-12-29 DIAGNOSIS — R768 Other specified abnormal immunological findings in serum: Secondary | ICD-10-CM

## 2018-12-29 LAB — T4, FREE: Free T4: 0.72 ng/dL (ref 0.61–1.12)

## 2018-12-29 LAB — GLUCOSE, CAPILLARY: Glucose-Capillary: 83 mg/dL (ref 70–99)

## 2018-12-29 LAB — RPR: RPR Ser Ql: NONREACTIVE

## 2018-12-29 MED ORDER — GABAPENTIN 100 MG PO CAPS
100.0000 mg | ORAL_CAPSULE | Freq: Two times a day (BID) | ORAL | Status: DC
  Administered 2018-12-29 – 2018-12-30 (×3): 100 mg via ORAL
  Filled 2018-12-29 (×3): qty 1

## 2018-12-29 MED ORDER — GABAPENTIN 100 MG PO CAPS
200.0000 mg | ORAL_CAPSULE | Freq: Every day | ORAL | Status: DC
  Administered 2018-12-29: 200 mg via ORAL
  Filled 2018-12-29: qty 2

## 2018-12-29 MED ORDER — ETODOLAC 400 MG PO TABS
400.0000 mg | ORAL_TABLET | Freq: Two times a day (BID) | ORAL | Status: DC
  Administered 2018-12-29 – 2018-12-30 (×3): 400 mg via ORAL
  Filled 2018-12-29 (×4): qty 1

## 2018-12-29 NOTE — Evaluation (Signed)
Clinical/Bedside Swallow Evaluation Patient Details  Name: Madeline Larsen MRN: [id number] Date of Birth: [birth year]  Today's Date: 12/29/2018 Time: SLP Start Time (ACUTE ONLY): 0805 SLP Stop Time (ACUTE ONLY): 0820 SLP Time Calculation (min) (ACUTE ONLY): 15 min  Past Medical History:  Past Medical History:  Diagnosis Date  . Depression   . Female-to-female transgender person   . GERD (gastroesophageal reflux disease)   . MRSA (methicillin resistant staph aureus) culture positive    Past Surgical History:  Past Surgical History:  Procedure Laterality Date  . FOOT SURGERY Left   . top surgery  02/2018   HPI:  33 yo transgenered female admitted with AMS.  CT head negative.  Recently started on Gabapentin for concern for fibromyalgia.  EEG with WNL.  Pt has h/o GERD but does not consistently use his reflux medication, MRSA + and depression.  Swallow eval ordered.  Pt denies h/o dysphagia x to pills.  He is for MRI today.   Assessment / Plan / Recommendation Clinical Impression  Patient presents with functional oropharyngeal swallow ability without indication of dysphagia nor aspiration/penetration.  He does appear with palatal deviation slightly to the right upon phonation and his right grip appears to be mildly weak.  However voice and articulation skills are near normal.  Pt passed a 3 ounce water test easily.  He does admit to pill dysphagia which SLP educated him to compensations.  Nausea reported after testing but pt could not have any medicine per RN, thus provided him with saltines and gingerale.  Recommend regular/thin diet, no SLP follow up.  Thanks. SLP Visit Diagnosis: Dysphagia, unspecified (R13.10)    Aspiration Risk  No limitations    Diet Recommendation Regular;Thin liquid   Liquid Administration via: Straw;Cup Medication Administration: (as tolerated, pt admits to pill dysphagia discussed alternative ways to take it) Supervision: Patient able to self  feed Compensations: Slow rate;Small sips/bites Postural Changes: Seated upright at 90 degrees;Remain upright for at least 30 minutes after po intake    Other  Recommendations Oral Care Recommendations: Oral care BID   Follow up Recommendations None      Frequency and Duration     n/a       Prognosis   n/a     Swallow Study   General Date of Onset: 12/29/18 HPI: 33 yo transgenered female admitted with AMS.  CT head negative.  Recently started on Gabapentin for concern for fibromyalgia.  EEG with WNL.  Pt has h/o GERD but does not consistently use his reflux medication, MRSA + and depression.  Swallow eval ordered.  Pt denies h/o dysphagia x to pills.  He is for MRI today. Type of Study: Bedside Swallow Evaluation Previous Swallow Assessment: none, cervical spine image in past negative Diet Prior to this Study: NPO Temperature Spikes Noted: No Respiratory Status: Room air History of Recent Intubation: No Behavior/Cognition: Alert;Cooperative;Other (Comment)(flat affect) Oral Cavity - Dentition: Adequate natural dentition Vision: Functional for self-feeding Self-Feeding Abilities: Able to feed self Patient Positioning: Upright in bed Baseline Vocal Quality: Normal Volitional Cough: Strong Volitional Swallow: Able to elicit    Oral/Motor/Sensory Function Overall Oral Motor/Sensory Function: Mild impairment Facial ROM: Within Functional Limits Facial Symmetry: Within Functional Limits Facial Strength: Within Functional Limits Facial Sensation: Within Functional Limits Lingual ROM: Within Functional Limits Lingual Symmetry: Within Functional Limits Lingual Strength: Reduced(slight) Lingual Sensation: (dnt) Velum: Impaired right(palatal deviation to left) Mandible: Within Functional Limits   Ice Chips Ice chips: Within functional limits Presentation: Self Fed;Spoon  Thin Liquid Thin Liquid: Within functional limits Presentation: Cup;Self Fed;Straw    Nectar Thick Nectar  Thick Liquid: Not tested   Honey Thick Honey Thick Liquid: Not tested   Puree Puree: Within functional limits Presentation: Self Fed;Spoon   Solid     Solid: Within functional limits Presentation: Self Orvan July 12/29/2018,8:34 AM  Donavan Burnet, MS [facility] SLP Acute Rehab Services Pager [phone] Office [phone]

## 2018-12-29 NOTE — Evaluation (Addendum)
Physical Therapy Evaluation Patient Details Name: Madeline Larsen MRN: [id number] DOB: January 12, 1986 Today's Date: 12/29/2018   History of Present Illness   Madeline Larsen is a 33 y.o. adult-female to female transgender female in with medical history significant of polyarthralgia and myalgia, GERD, positive ANA and osteoarthritis presents to emergency department due to sudden onset of altered mental status. MRI negative for acute abnormality.  Clinical Impression  Pt admitted with above diagnosis. Pt presents with mild left sided weakness, functional strength deficits and balance impairments. Ambulating 200 feet at a min guard assist level, displays left foot drag and tremulousness. Pt also reporting chronic "burning, aching," pain all over and increased fatigue. Pt will benefit from skilled PT to increase their independence and safety with mobility to allow discharge to the venue listed below.       Follow Up Recommendations Outpatient PT;Supervision for mobility/OOB    Equipment Recommendations  None recommended by PT    Recommendations for Other Services       Precautions / Restrictions Precautions Precautions: Fall Restrictions Weight Bearing Restrictions: No      Mobility  Bed Mobility Overal bed mobility: Modified Independent                Transfers Overall transfer level: Needs assistance Equipment used: None Transfers: Sit to/from Stand Sit to Stand: Min guard         General transfer comment: Min guard to initially steady, slightly tremulous  Ambulation/Gait Ambulation/Gait assistance: Min guard Gait Distance (Feet): 200 Feet Assistive device: None Gait Pattern/deviations: Step-through pattern;Decreased step length - left;Decreased dorsiflexion - left Gait velocity: decreased   General Gait Details: Pt mildly unsteady, with decreased left foot clearance, increased tremulousness  Stairs            Wheelchair Mobility    Modified Rankin (Stroke  Patients Only) Modified Rankin (Stroke Patients Only) Pre-Morbid Rankin Score: No symptoms Modified Rankin: Moderately severe disability     Balance Overall balance assessment: Mild deficits observed, not formally tested                                           Pertinent Vitals/Pain Pain Assessment: Faces Pain Score: 9  Faces Pain Scale: Hurts even more Pain Location: all over (chronic pain) Pain Descriptors / Indicators: Aching;Burning Pain Intervention(s): Monitored during session    Home Living Family/patient expects to be discharged to:: Private residence Living Arrangements: Spouse/significant other Available Help at Discharge: Family;Available 24 hours/day Type of Home: House Home Access: Stairs to enter   [employer] of Steps: 2 Home Layout: One level        Prior Function Level of Independence: Independent         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        Extremity/Trunk Assessment   Upper Extremity Assessment Upper Extremity Assessment: RUE deficits/detail;LUE deficits/detail RUE Deficits / Details: Grossly 5/5 LUE Deficits / Details: Grossly 4/5    Lower Extremity Assessment Lower Extremity Assessment: RLE deficits/detail;LLE deficits/detail RLE Deficits / Details: Grossly 5/5 LLE Deficits / Details: Grossly 4/5    Cervical / Trunk Assessment Cervical / Trunk Assessment: Normal  Communication   Communication: No difficulties  Cognition Arousal/Alertness: Awake/alert Behavior During Therapy: WFL for tasks assessed/performed Overall Cognitive Status: Within Functional Limits for tasks assessed  General Comments      Exercises     Assessment/Plan    PT Assessment Patient needs continued PT services  PT Problem List Decreased activity tolerance;Decreased balance;Decreased mobility;Pain       PT Treatment Interventions Gait  training;Stair training;Functional mobility training;Therapeutic exercise;Therapeutic activities;Balance training;Patient/family education    PT Goals (Current goals can be found in the Care Plan section)  Acute Rehab PT Goals Patient Stated Goal: "get my balance back." PT Goal Formulation: With patient Time For Goal Achievement: 01/12/19 Potential to Achieve Goals: Good    Frequency Min 3X/week   Barriers to discharge        Co-evaluation               AM-PAC PT "6 Clicks" Mobility  Outcome Measure Help needed turning from your back to your side while in a flat bed without using bedrails?: None Help needed moving from lying on your back to sitting on the side of a flat bed without using bedrails?: None Help needed moving to and from a bed to a chair (including a wheelchair)?: A Little Help needed standing up from a chair using your arms (e.g., wheelchair or bedside chair)?: A Little Help needed to walk in hospital room?: A Little Help needed climbing 3-5 steps with a railing? : A Little 6 Click Score: 20    End of Session Equipment Utilized During Treatment: Gait belt Activity Tolerance: Patient tolerated treatment well Patient left: in bed;with call bell/phone within reach;with bed alarm set;with family/visitor present Nurse Communication: Mobility status PT Visit Diagnosis: Unsteadiness on feet (R26.81);Difficulty in walking, not elsewhere classified (R26.2)    Time: 9798-9211 PT Time Calculation (min) (ACUTE ONLY): 14 min   Charges:   PT Evaluation $PT Eval Moderate Complexity: 1 Mod          Ellamae Sia, [place], DPT Acute Rehabilitation Services Pager [phone] Office [phone]   Willy Eddy 12/29/2018, 10:55 AM

## 2018-12-29 NOTE — Progress Notes (Signed)
PROGRESS NOTE    Madeline Larsen  ZOX:[id number] DOB: 1986/01/08 DOA: 12/28/2018 PCP: Anne Ng, NP     Brief Narrative:  Madeline Larsen is a 33 y.o. adult female to female transgender female with medical history significant of polyarthralgia and myalgia, GERD, positive ANA and osteoarthritis presents to emergency department due to sudden onset of altered mental status.  His wife is the historian who reports that patient went to work this morning and his coworkers notified that patient was not acting normally.  His blood pressure was noted to be very high in 180/80.  Had slurred speech, with rolling up of eyes, unable to recognize his wife, unable to walk and was very confused and not like himself. Patient brought to the emergency department for the concern of altered mental status. He is followed by rheumatologist outpatient for ANA positive polyarthralgia.  He takes hydroxychloroquine 200 mg p.o. twice daily.  Never diagnosed with lupus.  Family history is positive for lupus in her first 3 cousins.  Takes testosterone once in a week.  He recently seen by neurologist due to muscle pain and back pain and was placed on gabapentin 300 mg in the morning, 300 mg in the p.m. and 600 mg at bedtime.  He was also started on etodolic 400 mg p.o. twice daily. He lives with his wife at home, denies smoking, alcohol, illicit drug use.  New events last 24 hours / Subjective: He states that he feels like he is still sluggish.  He recalls feeling well leading up to the episode yesterday morning.  He was at work, started feeling flushed, shaky, dizzy.  He was sitting at the time of this event.  He had not eaten breakfast that morning.  He told his coworkers that he was not feeling well, does not recall events afterward.  Assessment & Plan:   Principal Problem:   Altered mental state Active Problems:   Female-to-female transgender person   Neutropenia (HCC)   Polyarthralgia   ANA positive   GERD  (gastroesophageal reflux disease)   Marijuana abuse   Acute encephalopathy -CT head normal -EEG unremarkable -MRI brain negative -RPR nonreactive -Possible side effect from gabapentin, which was newly initiated 2 weeks ago. Resume today at a lower dose  Polyarthralgia, myalgia -Followed by Dr. Corliss Skains, rheumatologist -Continue hydroxychloroquine -Resume gabapentin at a lower dose, continue etodolic  Chronic abdominal pain -Abdominal ultrasound normal  Female to female transgender -On testosterone once weekly   Low TSH  -Free T4 is normal.  Recommend outpatient reevaluation in 4 to 6 weeks    DVT prophylaxis: Lovenox  Code Status: Full code Family Communication: No family at bedside Disposition Plan: Pending improvement, resume gabapentin at a lower dose today   Consultants:   None  Procedures:   EEG IMPRESSION: This study is within normal limits. No seizures or epileptiform discharges were seen throughout the recording.  Multiple episodes of jerking were captured during which patient appeared to have bilateral shoulder jerks. Concomitant eeg didn't show any change to suggest seizure.  Antimicrobials:  Anti-infectives (From admission, onward)   Start     Dose/Rate Route Frequency Ordered Stop   12/28/18 2200  hydroxychloroquine (PLAQUENIL) tablet 200 mg     200 mg Oral 2 times daily 12/28/18 1627          Objective: Vitals:   12/28/18 2351 12/29/18 0342 12/29/18 0752 12/29/18 1242  BP: 104/74 114/75 122/77 133/75  Pulse: 62 66 60 (!) 56  Resp: 17 17  18 18  Temp: 98 F (36.7 C) 98.2 F (36.8 C) 98.1 F (36.7 C) 97.9 F (36.6 C)  TempSrc: Axillary Oral Oral Oral  SpO2: 100% 98% 100% 100%  Weight:      Height:        Intake/Output Summary (Last 24 hours) at 12/29/2018 1410 Last data filed at 12/29/2018 1300 Gross per 24 hour  Intake 120 ml  Output -  Net 120 ml   Filed Weights   12/28/18 0955 12/28/18 2208  Weight: 99.8 kg 110.4 kg     Examination:  General exam: Appears calm and comfortable  Respiratory system: Clear to auscultation. Respiratory effort normal. No respiratory distress. No conversational dyspnea.  Cardiovascular system: S1 & S2 heard, RRR. No murmurs. No pedal edema. Gastrointestinal system: Abdomen is nondistended, soft and nontender. Normal bowel sounds heard. Central nervous system: Alert and oriented. No focal neurological deficits. Speech clear.  Extremities: Symmetric in appearance  Skin: No rashes, lesions or ulcers on exposed skin  Psychiatry: Judgement and insight appear normal. Mood & affect appropriate.   Data Reviewed: I have personally reviewed following labs and imaging studies  CBC: Recent Labs  Lab 12/28/18 1013  WBC 3.6*  NEUTROABS 1.9  HGB 13.6  HCT 43.8  MCV 89.0  PLT 238   Basic Metabolic Panel: Recent Labs  Lab 12/28/18 1013 12/28/18 2144  NA 138  --   K 4.0  --   CL 104  --   CO2 24  --   GLUCOSE 96  --   BUN 7  --   CREATININE 0.88  --   CALCIUM 9.1  --   MG  --  2.0  PHOS  --  3.2   GFR: Estimated Creatinine Clearance (by C-G formula based on SCr of 0.88 mg/dL) Female: 604.5 mL/min Female: 128.7 mL/min Liver Function Tests: Recent Labs  Lab 12/28/18 1013  AST 23  ALT 15  ALKPHOS 50  BILITOT 0.4  PROT 7.3  ALBUMIN 3.8   Recent Labs  Lab 12/28/18 2144  LIPASE 31   Recent Labs  Lab 12/28/18 2144  AMMONIA 31   Coagulation Profile: No results for input(s): INR, PROTIME in the last 168 hours. Cardiac Enzymes: No results for input(s): CKTOTAL, CKMB, CKMBINDEX, TROPONINI in the last 168 hours. BNP (last 3 results) No results for input(s): PROBNP in the last 8760 hours. HbA1C: Recent Labs    12/28/18 2144  HGBA1C 6.1*   CBG: Recent Labs  Lab 12/28/18 1734 12/29/18 0621  GLUCAP 88 83   Lipid Profile: No results for input(s): CHOL, HDL, LDLCALC, TRIG, CHOLHDL, LDLDIRECT in the last 72 hours. Thyroid Function Tests: Recent Labs     12/28/18 1205 12/29/18 1034  TSH 0.203*  --   FREET4  --  0.72   Anemia Panel: Recent Labs    12/28/18 2144  VITAMINB12 406   Sepsis Labs: Recent Labs  Lab 12/28/18 1016  LATICACIDVEN 0.8    Recent Results (from the past 240 hour(s))  SARS CORONAVIRUS 2 (TAT 6-24 HRS) Nasopharyngeal Nasopharyngeal Swab     Status: None   Collection Time: 12/28/18  2:35 PM   Specimen: Nasopharyngeal Swab  Result Value Ref Range Status   SARS Coronavirus 2 NEGATIVE NEGATIVE Final    Comment: (NOTE) SARS-CoV-2 target nucleic acids are NOT DETECTED. The SARS-CoV-2 RNA is generally detectable in upper and lower respiratory specimens during the acute phase of infection. Negative results do not preclude SARS-CoV-2 infection, do not rule out co-infections  with other pathogens, and should not be used as the sole basis for treatment or other patient management decisions. Negative results must be combined with clinical observations, patient history, and epidemiological information. The expected result is Negative. Fact Sheet for Patients: SugarRoll.be Fact Sheet for Healthcare Providers: https://www.woods-mathews.com/ This test is not yet approved or cleared by the [place] FDA and  has been authorized for detection and/or diagnosis of SARS-CoV-2 by FDA under an Emergency Use Authorization (EUA). This EUA will remain  in effect (meaning this test can be used) for the duration of the COVID-19 declaration under Section 56 4(b)(1) of the Act, 21 U.S.C. section 360bbb-3(b)(1), unless the authorization is terminated or revoked sooner. Performed at [facility], [place] [address] [place], [place] [address]   MRSA PCR Screening     Status: None   Collection Time: 12/28/18  9:42 PM   Specimen: Nasal Mucosa; Nasopharyngeal  Result Value Ref Range Status   MRSA by PCR NEGATIVE NEGATIVE Final    Comment:        The GeneXpert MRSA Assay (FDA approved  for NASAL specimens only), is one component of a comprehensive MRSA colonization surveillance program. It is not intended to diagnose MRSA infection nor to guide or monitor treatment for MRSA infections. Performed at [facility], [place] [address], [place], [place] [address]       Radiology Studies: Ct Head Wo Contrast  Result Date: 12/28/2018 CLINICAL DATA:  33 year old female with altered mental status. EXAM: CT HEAD WITHOUT CONTRAST TECHNIQUE: Contiguous axial images were obtained from the base of the skull through the vertex without intravenous contrast. COMPARISON:  Brain MRI dated 04/06/2014 FINDINGS: Brain: No evidence of acute infarction, hemorrhage, hydrocephalus, extra-axial collection or mass lesion/mass effect. Vascular: No hyperdense vessel or unexpected calcification. Skull: Normal. Negative for fracture or focal lesion. Sinuses/Orbits: No acute finding. Other: None IMPRESSION: Normal noncontrast CT of the brain. Electronically Signed   By: Anner Crete M.D.   On: 12/28/2018 13:27   Mr Brain Wo Contrast  Result Date: 12/29/2018 CLINICAL DATA:  33 year old female with positive ANA arthritis. Sudden onset altered mental status. Hypertensive on presentation (180/80). Slurred speech, confusion. EXAM: MRI HEAD WITHOUT CONTRAST TECHNIQUE: Multiplanar, multiecho pulse sequences of the brain and surrounding structures were obtained without intravenous contrast. COMPARISON:  Head CT 12/28/2018. Brain MRI 04/06/2014. FINDINGS: Brain: Normal cerebral volume. No restricted diffusion to suggest acute infarction. No midline shift, mass effect, evidence of mass lesion, ventriculomegaly, extra-axial collection or acute intracranial hemorrhage. Cervicomedullary junction and pituitary are within normal limits. Pearline Cables and white matter signal is within normal limits throughout the brain. Incidental small right choroid plexus cyst (normal variant). No chronic cerebral blood products or  encephalomalacia identified Vascular: Major intracranial vascular flow voids are stable since 2016. Skull and upper cervical spine: Negative visible cervical spine. Visualized bone marrow signal is within normal limits. Sinuses/Orbits: Negative orbits. Small right maxillary sinus mucous retention cyst is chronic. Other: Mastoids remain clear. Visible internal auditory structures appear normal. Scalp and face soft tissues appear negative. IMPRESSION: No acute intracranial abnormality. Stable since 2016 and normal noncontrast MRI appearance of the brain. Electronically Signed   By: Genevie Ann M.D.   On: 12/29/2018 09:41   US Abdomen Complete  Result Date: 12/28/2018 CLINICAL DATA:  Diffuse, chronic abdominal pain. EXAM: ABDOMEN ULTRASOUND COMPLETE COMPARISON:  CT abdomen and pelvis 02/16/2017 abdominal ultrasound 08/31/2013. FINDINGS: Gallbladder: No gallstones or wall thickening visualized. No sonographic Murphy sign noted by sonographer.  Common bile duct: Diameter: 0.4 cm Liver: No focal lesion identified. Within normal limits in parenchymal echogenicity. Portal vein is patent on color Doppler imaging with normal direction of blood flow towards the liver. IVC: No abnormality visualized. Pancreas: Visualized portion unremarkable. Spleen: Size and appearance within normal limits. Right Kidney: Length: 10.3 cm. Echogenicity within normal limits. No mass or hydronephrosis visualized. Left Kidney: Length: 10.1 cm. Echogenicity within normal limits. No mass or hydronephrosis visualized. Abdominal aorta: No aneurysm visualized. Other findings: None. IMPRESSION: Normal examination. Electronically Signed   By: Drusilla Kannerhomas  Dalessio M.D.   On: 12/28/2018 15:13      Scheduled Meds: . enoxaparin (LOVENOX) injection  50 mg Subcutaneous Q24H  . etodolac  400 mg Oral BID  . gabapentin  100 mg Oral BID   And  . gabapentin  200 mg Oral QHS  . hydroxychloroquine  200 mg Oral BID   Continuous Infusions:   LOS: 1 day       Time spent: 40 minutes   Noralee StainJennifer Yelitza Reach, DO [facility] 12/29/2018, 2:10 PM   Available via Epic secure chat 7am-7pm After these hours, please refer to coverage provider listed on [email or web address]

## 2018-12-30 LAB — GLUCOSE, CAPILLARY: Glucose-Capillary: 84 mg/dL (ref 70–99)

## 2018-12-30 MED ORDER — GABAPENTIN 100 MG PO CAPS: ORAL_CAPSULE | ORAL | 0 refills | Status: DC

## 2018-12-30 NOTE — Evaluation (Signed)
Occupational Therapy Evaluation Patient Details Name: Madeline Larsen MRN: [id number] DOB: Dec 31, 1985 Today's Date: 12/30/2018    History of Present Illness  Madeline Larsen is a 33 y.o. adult-female to female transgender female in with medical history significant of polyarthralgia and myalgia, GERD, positive ANA and osteoarthritis presents to emergency department due to sudden onset of altered mental status. MRI negative for acute abnormality.   Clinical Impression   Pt admitted with the above diagnosis and has the deficits listed below. Pt would benefit from continued OT to increase safety and efficiency with basic adls so he can d/c home with his wife. Pt was previously independent and working in transportation at [employer] and will required increased tolerance for activity and balance to return to this job.  Rec OPOT at d/c to maximize functioning.  Pt also with slight delay with speech at times when conversing.  Feel he would benefit from a ST evaluation.      Follow Up Recommendations  Outpatient OT;Supervision/Assistance - 24 hour    Equipment Recommendations  Tub/shower seat    Recommendations for Other Services Speech consult(pt with delay when speaking at times)     Precautions / Restrictions Precautions Precautions: Fall Precaution Comments: Drags L foot when walking Restrictions Weight Bearing Restrictions: No      Mobility Bed Mobility Overal bed mobility: Modified Independent             General bed mobility comments: HOB was at 30 degrees  Transfers Overall transfer level: Needs assistance Equipment used: Rolling walker (2 wheeled) Transfers: Sit to/from [employer] CorporationStand;Stand Pivot Transfers Sit to Stand: Min guard Stand pivot transfers: Min guard       General transfer comment: Min guard to initially steady, slightly tremulous    Balance Overall balance assessment: Mild deficits observed, not formally tested                                          ADL either performed or assessed with clinical judgement   ADL Overall ADL's : Needs assistance/impaired Eating/Feeding: Independent;Sitting   Grooming: Wash/dry hands;Wash/dry face;Oral care;Supervision/safety;Standing Grooming Details (indicate cue type and reason): Pt stood at sink for 5 minutes to groom and became very fatigued requesting to sit.  Pt states his L harm becomes heavy and tired during adls and overall he feels like he is at a gym working out instead of just brushing this teetch. Upper Body Bathing: Set up;Sitting   Lower Body Bathing: Minimal assistance;Sit to/from stand Lower Body Bathing Details (indicate cue type and reason): Pt required cues to sit in figure 4 to bathe and dress to save energy.  Pt required min assist on his feet due to decreased balance. Upper Body Dressing : Set up;Sitting   Lower Body Dressing: Minimal assistance;Sit to/from stand;Cueing for compensatory techniques Lower Body Dressing Details (indicate cue type and reason): cuing on how to make things easier. pt would reach to ground to donn socks and become very SOB.  With cues to cross legs, pt was more independent. Toilet Transfer: Minimal assistance;Ambulation;Comfort height toilet;Grab bars;RW StatisticianToilet Transfer Details (indicate cue type and reason): Pt walked to bathroom with min assist and drag to his L foot. Pt used walker due to being tremulous and weak on the left. Toileting- ArchitectClothing Manipulation and Hygiene: Min guard;Sit to/from stand       Functional mobility during ADLs: Minimal assistance;Rolling walker General  ADL Comments: Pt able to complete adls with very little assist. Pt becomes very fatigued while doing the tasks and requires frequent rest breaks.  Pt is unsteady on his feet due to a foot drag on the left when ambulating.     Vision Baseline Vision/History: No visual deficits Patient Visual Report: No change from baseline Vision Assessment?: No apparent visual  deficits     Perception Perception Perception Tested?: No   Praxis Praxis Praxis tested?: Within functional limits    Pertinent Vitals/Pain Pain Assessment: 0-10 Pain Score: 5  Pain Location: headach Pain Descriptors / Indicators: Aching Pain Intervention(s): Monitored during session;Repositioned     Hand Dominance Right   Extremity/Trunk Assessment Upper Extremity Assessment Upper Extremity Assessment: LUE deficits/detail RUE Deficits / Details: Grossly 5/5 LUE Deficits / Details: Grossly 4/5 LUE Sensation: decreased light touch LUE Coordination: decreased fine motor   Lower Extremity Assessment Lower Extremity Assessment: Defer to PT evaluation   Cervical / Trunk Assessment Cervical / Trunk Assessment: Normal   Communication Communication Communication: Other (comment)(slight delay to some words when speaking)   Cognition Arousal/Alertness: Awake/alert Behavior During Therapy: WFL for tasks assessed/performed Overall Cognitive Status: Within Functional Limits for tasks assessed                                 General Comments: Pt has mildly slow processing but appears intact in most other areas.    General Comments  Pt most limited by fatigue and decreased activity tolerance as well as weakness in LLE when ambulating.     Exercises     Shoulder Instructions      Home Living Family/patient expects to be discharged to:: Private residence Living Arrangements: Spouse/significant other Available Help at Discharge: Family;Available 24 hours/day Type of Home: House Home Access: Stairs to enter [employer] of Steps: 2 Entrance Stairs-Rails: Right Home Layout: One level     Bathroom Shower/Tub: Walk-in shower;Door   Bathroom Toilet: Handicapped height     Home Equipment: None   Additional Comments: Pt is a driver for [employer]      Prior Functioning/Environment Level of Independence: Independent        Comments: works  in Camera operator Problem List: Decreased strength;Decreased activity tolerance;Impaired balance (sitting and/or standing);Decreased coordination;Pain      OT Treatment/Interventions: Self-care/ADL training;DME and/or AE instruction;Therapeutic activities;Balance training    OT Goals(Current goals can be found in the care plan section) Acute Rehab OT Goals Patient Stated Goal: "get my balance back." OT Goal Formulation: With patient Time For Goal Achievement: 01/13/19 Potential to Achieve Goals: Good ADL Goals Pt Will Perform Tub/Shower Transfer: Shower transfer;ambulating;rolling walker;with supervision Additional ADL Goal #1: Pt wil walk to bathroom with walker and complete all toileting on comfort commode with no rails and mod I. Additional ADL Goal #2: Pt will use walker to gather clothes from closet and dress self with supervision. Additional ADL Goal #3: Pt will independently state 3 things he can do at home during adls to save energy as he gets very SOB during adl routine.  OT Frequency: Min 2X/week   Barriers to D/C:    wife there 24/7 if needed       Co-evaluation              AM-PAC OT "6 Clicks" Daily Activity     Outcome Measure Help from another person eating meals?: None  Help from another person taking care of personal grooming?: None Help from another person toileting, which includes using toliet, bedpan, or urinal?: A Little Help from another person bathing (including washing, rinsing, drying)?: A Little Help from another person to put on and taking off regular upper body clothing?: None Help from another person to put on and taking off regular lower body clothing?: A Little 6 Click Score: 21   End of Session Nurse Communication: Mobility status  Activity Tolerance: Patient limited by fatigue Patient left: in chair;with call bell/phone within reach  OT Visit Diagnosis: Unsteadiness on feet (R26.81);Other symptoms and signs involving the  nervous system (Q67.619)                Time: 5093-2671 OT Time Calculation (min): 28 min Charges:  OT General Charges $OT Visit: 1 Visit OT Evaluation $OT Eval Moderate Complexity: 1 Mod OT Treatments $Self Care/Home Management : 8-22 mins   Glenford Peers 12/30/2018, 9:58 AM

## 2018-12-30 NOTE — TOC Initial Note (Signed)
Transition of Care [facility]) - Initial/Assessment Note    Patient Details  Name: Madeline Larsen MRN: [id number] Date of Birth: Sep 14, 1985  [facility] CM/SW Contact:    Benard Halsted, LCSW Phone Number: 12/30/2018, 1:26 PM  Clinical Narrative:                 Patient is agreeable to outpatient rehab. Referral sent. Patient also requesting a walker. [employer] to deliver to room prior to discharge. Patient notifying his ride. No other needs present.   Expected Discharge Plan: OP Rehab Barriers to Discharge: No Barriers Identified   Patient Goals and CMS Choice Patient states their goals for this hospitalization and ongoing recovery are:: Return home CMS Medicare.gov Compare Post Acute Care list provided to:: Patient Choice offered to / list presented to : Patient  Expected Discharge Plan and Services Expected Discharge Plan: OP Rehab In-house Referral: NA Discharge Planning Services: CM Consult Post Acute Care Choice: (OP Rehab) Living arrangements for the past 2 months: Single Family Home Expected Discharge Date: 12/30/18               DME Arranged: Gilford Rile rolling with seat DME Agency: [employer] Date DME Agency Contacted: 12/30/18 Time DME Agency Contacted: [id number] Representative spoke with at DME Agency: [place]: NA        Prior Living Arrangements/Services Living arrangements for the past 2 months: [place] with:: Spouse Patient language and need for interpreter reviewed:: Yes Do you feel safe going back to the place where you live?: Yes      Need for Family Participation in Patient Care: No (Comment) Care giver support system in place?: Yes (comment)   Criminal Activity/Legal Involvement Pertinent to Current Situation/Hospitalization: No - Comment as needed  Activities of Daily Living      Permission Sought/Granted Permission sought to share information with : Facility Arts administrator granted to share  info w AGENCY: [employer]        Emotional Assessment Appearance:: Appears stated age Attitude/Demeanor/Rapport: Engaged Affect (typically observed): Accepting, Appropriate Orientation: : Oriented to Self, Oriented to Place, Oriented to  Time, Oriented to Situation Alcohol / Substance Use: Not Applicable Psych Involvement: No (comment)  Admission diagnosis:  Encephalopathy [G93.40] Abdominal pain [R10.9] Patient Active Problem List   Diagnosis Date Noted  . Altered mental state 12/28/2018  . GERD (gastroesophageal reflux disease) 12/28/2018  . Marijuana abuse 12/28/2018  . Myalgia 12/16/2018  . Polyarthralgia 12/16/2018  . ANA positive 12/16/2018  . Neutropenia ([place]) 01/27/2018  . 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 Headache 06/18/2017  . 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 Status post left foot surgery 04/09/2015  . 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

## 2018-12-30 NOTE — Discharge Summary (Signed)
Physician Discharge Summary  Madeline Larsen RUE:[id number] DOB: 10-May-1985 DOA: 12/28/2018  PCP: Anne Ng, NP  Admit date: 12/28/2018 Discharge date: 12/30/2018  Admitted From: Home Disposition:  Home  Recommendations for Outpatient Follow-up:  1. Follow up with PCP in 1 week 2. Follow up with Dr. Epimenio Foot as scheduled 3. Outpatient PT OT, referral sent at time of discharge  4. Recommend repeat TSH in 4 to 6 weeks  Discharge Condition: Stable CODE STATUS: Full  Diet recommendation: Regular diet   Brief/Interim Summary: Madeline Larsen is a 33 y.o.adult female to female transgender female with medical history significant ofpolyarthralgia and myalgia, GERD, positive ANA and osteoarthritis presents to emergency department due to sudden onset of altered mental status. His wife is the historian who reports that patient went to work this morning and his coworkers notified that patient was not acting normally. His blood pressure was noted to be very high in 180/80. Had slurred speech, with rolling up of eyes, unable to recognize his wife, unable to walk and was very confused and not like himself. Patient brought to the emergency department for the concern of altered mental status. He is followed by rheumatologist outpatient for ANA positive polyarthralgia. He takes hydroxychloroquine 200 mg p.o. twice daily. Never diagnosed with lupus. Family history is positive for lupus in her first 3 cousins. Takes testosterone once in a week. He recently seen by neurologist due to muscle pain and back pain and was placed on gabapentin 300 mg in the morning, 300 mg in the p.m. and 600 mg at bedtime. He was also started on etodolic 400 mg p.o. twice daily. He lives with his wife at home, denies smoking, alcohol, illicit drug use.  Work-up has been largely unremarkable.  He underwent CT head, EEG, MRI brain, RPR.  Gabapentin was stopped with improvement in his mentation.  Gabapentin was resumed at a  lower dose.  He was seen by PT OT prior to discharge home.  Discharge Diagnoses:  Principal Problem:   Altered mental state Active Problems:   Female-to-female transgender person   Neutropenia (HCC)   Polyarthralgia   ANA positive   GERD (gastroesophageal reflux disease)   Marijuana abuse   Acute metabolic encephalopathy -CT head normal -EEG unremarkable -MRI brain negative -RPR nonreactive -Possible side effect from gabapentin, which was newly initiated 2 weeks ago. Resumed at a lower dose -Resolved  Polyarthralgia, myalgia -Followed by Dr. Corliss Skains, rheumatologist -Continue hydroxychloroquine -Resume gabapentin at a lower dose, continue etodolic   Chronic abdominal pain -Abdominal ultrasound normal  Female to female transgender -On testosterone once weekly   Low TSH  -Free T4 is normal.  Recommend outpatient reevaluation in 4 to 6 weeks    Discharge Instructions  Discharge Instructions    Ambulatory referral to Occupational Therapy   Complete by: As directed    Ambulatory referral to Physical Therapy   Complete by: As directed    Call MD for:  difficulty breathing, headache or visual disturbances   Complete by: As directed    Call MD for:  extreme fatigue   Complete by: As directed    Call MD for:  persistant dizziness or light-headedness   Complete by: As directed    Call MD for:  severe uncontrolled pain   Complete by: As directed    Diet general   Complete by: As directed    Discharge instructions   Complete by: As directed    You were cared for by a hospitalist during your hospital  stay. If you have any questions about your discharge medications or the care you received while you were in the hospital after you are discharged, you can call the unit and ask to speak with the hospitalist on call if the hospitalist that took care of you is not available. Once you are discharged, your primary care physician will handle any further medical issues. Please note  that NO REFILLS for any discharge medications will be authorized once you are discharged, as it is imperative that you return to your primary care physician (or establish a relationship with a primary care physician if you do not have one) for your aftercare needs so that they can reassess your need for medications and monitor your lab values.   Increase activity slowly   Complete by: As directed      Allergies as of 12/30/2018   No Known Allergies     Medication List    TAKE these medications   acetaminophen 500 MG tablet Commonly known as: TYLENOL Take 1,000 mg by mouth every 6 (six) hours as needed for pain.   etodolac 400 MG tablet Commonly known as: LODINE Take 1 tablet (400 mg total) by mouth 2 (two) times daily.   gabapentin 100 MG capsule Commonly known as: NEURONTIN One po qAM; one po qPM and two po qHS What changed: medication strength   hydroxychloroquine 200 MG tablet Commonly known as: PLAQUENIL TAKE 1 TABLET BY MOUTH TWICE A DAY   hyoscyamine 0.125 MG tablet Commonly known as: LEVSIN Take 1 tablet (0.125 mg total) by mouth every 6 (six) hours as needed for cramping.   ibuprofen 200 MG tablet Commonly known as: ADVIL Take 200 mg by mouth as needed for moderate pain.   lactulose 10 GM/15ML solution Commonly known as: CHRONULAC Take 30 mLs (20 g total) by mouth daily as needed for severe constipation.   ondansetron 8 MG disintegrating tablet Commonly known as: Zofran ODT Take 1 tablet (8 mg total) by mouth every 8 (eight) hours as needed for nausea or vomiting.   testosterone cypionate 200 MG/ML injection Commonly known as: DEPOTESTOSTERONE CYPIONATE Inject 0.4 mLs (80 mg total) into the muscle once a week.            Durable Medical Equipment  (From admission, onward)         Start     Ordered   12/30/18 1119  DME tub bench  Once     12/30/18 1118          No Known Allergies  Consultations:  None    Procedures/Studies: Ct Head Wo  Contrast  Result Date: 12/28/2018 CLINICAL DATA:  33 year old female with altered mental status. EXAM: CT HEAD WITHOUT CONTRAST TECHNIQUE: Contiguous axial images were obtained from the base of the skull through the vertex without intravenous contrast. COMPARISON:  Brain MRI dated 04/06/2014 FINDINGS: Brain: No evidence of acute infarction, hemorrhage, hydrocephalus, extra-axial collection or mass lesion/mass effect. Vascular: No hyperdense vessel or unexpected calcification. Skull: Normal. Negative for fracture or focal lesion. Sinuses/Orbits: No acute finding. Other: None IMPRESSION: Normal noncontrast CT of the brain. Electronically Signed   By: Elgie Collard M.D.   On: 12/28/2018 13:27   Mr Brain Wo Contrast  Result Date: 12/29/2018 CLINICAL DATA:  33 year old female with positive ANA arthritis. Sudden onset altered mental status. Hypertensive on presentation (180/80). Slurred speech, confusion. EXAM: MRI HEAD WITHOUT CONTRAST TECHNIQUE: Multiplanar, multiecho pulse sequences of the brain and surrounding structures were obtained without intravenous contrast. COMPARISON:  Head CT 12/28/2018. Brain MRI 04/06/2014. FINDINGS: Brain: Normal cerebral volume. No restricted diffusion to suggest acute infarction. No midline shift, mass effect, evidence of mass lesion, ventriculomegaly, extra-axial collection or acute intracranial hemorrhage. Cervicomedullary junction and pituitary are within normal limits. Wallace Cullens and white matter signal is within normal limits throughout the brain. Incidental small right choroid plexus cyst (normal variant). No chronic cerebral blood products or encephalomalacia identified Vascular: Major intracranial vascular flow voids are stable since 2016. Skull and upper cervical spine: Negative visible cervical spine. Visualized bone marrow signal is within normal limits. Sinuses/Orbits: Negative orbits. Small right maxillary sinus mucous retention cyst is chronic. Other: Mastoids remain  clear. Visible internal auditory structures appear normal. Scalp and face soft tissues appear negative. IMPRESSION: No acute intracranial abnormality. Stable since 2016 and normal noncontrast MRI appearance of the brain. Electronically Signed   By: Odessa Fleming M.D.   On: 12/29/2018 09:41   US Abdomen Complete  Result Date: 12/28/2018 CLINICAL DATA:  Diffuse, chronic abdominal pain. EXAM: ABDOMEN ULTRASOUND COMPLETE COMPARISON:  CT abdomen and pelvis 02/16/2017 abdominal ultrasound 08/31/2013. FINDINGS: Gallbladder: No gallstones or wall thickening visualized. No sonographic Murphy sign noted by sonographer. Common bile duct: Diameter: 0.4 cm Liver: No focal lesion identified. Within normal limits in parenchymal echogenicity. Portal vein is patent on color Doppler imaging with normal direction of blood flow towards the liver. IVC: No abnormality visualized. Pancreas: Visualized portion unremarkable. Spleen: Size and appearance within normal limits. Right Kidney: Length: 10.3 cm. Echogenicity within normal limits. No mass or hydronephrosis visualized. Left Kidney: Length: 10.1 cm. Echogenicity within normal limits. No mass or hydronephrosis visualized. Abdominal aorta: No aneurysm visualized. Other findings: None. IMPRESSION: Normal examination. Electronically Signed   By: Drusilla Kanner M.D.   On: 12/28/2018 15:13   Xr Lumbar Spine 2-3 Views  Result Date: 12/14/2018 Mild dextroscoliosis was noted.  No significant disc space narrowing was noted.  Mild facet joint arthropathy was noted.  No SI joint to sclerosis was noted. Impression: These findings are consistent with mild dextroscoliosis and mild facet joint arthropathy.   EEG 12/28/2018 IMPRESSION: This study is within normal limits. No seizures or epileptiform discharges were seen throughout the recording.  Multiple episodes of jerking were captured during which patient appeared to have bilateral shoulder jerks. Concomitant eeg didn't show any change  to suggest seizure.   Discharge Exam: Vitals:   12/30/18 0316 12/30/18 0729  BP: 123/68 126/77  Pulse: (!) 54 60  Resp: 16 19  Temp: (!) 97.5 F (36.4 C) 98.1 F (36.7 C)  SpO2: 99% 100%     General: Pt is alert, awake, not in acute distress Cardiovascular: RRR, S1/S2 +, no edema Respiratory: CTA bilaterally, no wheezing, no rhonchi, no respiratory distress, no conversational dyspnea  Abdominal: Soft, NT, ND, bowel sounds + Extremities: no edema, no cyanosis Psych: Normal mood and affect, stable judgement and insight     The results of significant diagnostics from this hospitalization (including imaging, microbiology, ancillary and laboratory) are listed below for reference.     Microbiology: Recent Results (from the past 240 hour(s))  SARS CORONAVIRUS 2 (TAT 6-24 HRS) Nasopharyngeal Nasopharyngeal Swab     Status: None   Collection Time: 12/28/18  2:35 PM   Specimen: Nasopharyngeal Swab  Result Value Ref Range Status   SARS Coronavirus 2 NEGATIVE NEGATIVE Final    Comment: (NOTE) SARS-CoV-2 target nucleic acids are NOT DETECTED. The SARS-CoV-2 RNA is generally detectable in upper and lower respiratory specimens during the acute  phase of infection. Negative results do not preclude SARS-CoV-2 infection, do not rule out co-infections with other pathogens, and should not be used as the sole basis for treatment or other patient management decisions. Negative results must be combined with clinical observations, patient history, and epidemiological information. The expected result is Negative. Fact Sheet for Patients: SugarRoll.be Fact Sheet for Healthcare Providers: https://www.woods-mathews.com/ This test is not yet approved or cleared by the [place] FDA and  has been authorized for detection and/or diagnosis of SARS-CoV-2 by FDA under an Emergency Use Authorization (EUA). This EUA will remain  in effect (meaning this test  can be used) for the duration of the COVID-19 declaration under Section 56 4(b)(1) of the Act, 21 U.S.C. section 360bbb-3(b)(1), unless the authorization is terminated or revoked sooner. Performed at [facility], [place] [address] [place], [place] [address]   MRSA PCR Screening     Status: None   Collection Time: 12/28/18  9:42 PM   Specimen: Nasal Mucosa; Nasopharyngeal  Result Value Ref Range Status   MRSA by PCR NEGATIVE NEGATIVE Final    Comment:        The GeneXpert MRSA Assay (FDA approved for NASAL specimens only), is one component of a comprehensive MRSA colonization surveillance program. It is not intended to diagnose MRSA infection nor to guide or monitor treatment for MRSA infections. Performed at [facility], [place] [address], [place], [place] [address]      Labs: BNP (last 3 results) No results for input(s): BNP in the last 8760 hours. Basic Metabolic Panel: Recent Labs  Lab 12/28/18 1013 12/28/18 2144  NA 138  --   K 4.0  --   CL 104  --   CO2 24  --   GLUCOSE 96  --   BUN 7  --   CREATININE 0.88  --   CALCIUM 9.1  --   MG  --  2.0  PHOS  --  3.2   Liver Function Tests: Recent Labs  Lab 12/28/18 1013  AST 23  ALT 15  ALKPHOS 50  BILITOT 0.4  PROT 7.3  ALBUMIN 3.8   Recent Labs  Lab 12/28/18 2144  LIPASE 31   Recent Labs  Lab 12/28/18 2144  AMMONIA 31   CBC: Recent Labs  Lab 12/28/18 1013  WBC 3.6*  NEUTROABS 1.9  HGB 13.6  HCT 43.8  MCV 89.0  PLT 238   Cardiac Enzymes: No results for input(s): CKTOTAL, CKMB, CKMBINDEX, TROPONINI in the last 168 hours. BNP: Invalid input(s): POCBNP CBG: Recent Labs  Lab 12/28/18 1734 12/29/18 0621 12/30/18 0616  GLUCAP 88 83 84   D-Dimer No results for input(s): DDIMER in the last 72 hours. Hgb A1c Recent Labs    12/28/18 2144  HGBA1C 6.1*   Lipid Profile No results for input(s): CHOL, HDL, LDLCALC, TRIG, CHOLHDL, LDLDIRECT in the last 72 hours. Thyroid function  studies Recent Labs    12/28/18 1205  TSH 0.203*   Anemia work up Recent Labs    12/28/18 2144  VITAMINB12 406   Urinalysis    Component Value Date/Time   COLORURINE YELLOW 12/28/2018 [place] 12/28/2018 1014   LABSPEC 1.009 12/28/2018 1014   PHURINE 6.0 12/28/2018 1014   [place] 12/28/2018 1014   [place] 12/28/2018 1014   BILIRUBINUR SMALL (A) 12/28/2018 [place] 12/28/2018 1014   PROTEINUR NEGATIVE 12/28/2018 1014   UROBILINOGEN 1.0 09/18/2016 1800   NITRITE  NEGATIVE 12/28/2018 1014   LEUKOCYTESUR TRACE (A) 12/28/2018 1014   Sepsis Labs Invalid input(s): PROCALCITONIN,  WBC,  LACTICIDVEN Microbiology Recent Results (from the past 240 hour(s))  SARS CORONAVIRUS 2 (TAT 6-24 HRS) Nasopharyngeal Nasopharyngeal Swab     Status: None   Collection Time: 12/28/18  2:35 PM   Specimen: Nasopharyngeal Swab  Result Value Ref Range Status   SARS Coronavirus 2 NEGATIVE NEGATIVE Final    Comment: (NOTE) SARS-CoV-2 target nucleic acids are NOT DETECTED. The SARS-CoV-2 RNA is generally detectable in upper and lower respiratory specimens during the acute phase of infection. Negative results do not preclude SARS-CoV-2 infection, do not rule out co-infections with other pathogens, and should not be used as the sole basis for treatment or other patient management decisions. Negative results must be combined with clinical observations, patient history, and epidemiological information. The expected result is Negative. Fact Sheet for Patients: HairSlick.nohttps://www.fda.gov/media/138098/download Fact Sheet for Healthcare Providers: quierodirigir.comhttps://www.fda.gov/media/138095/download This test is not yet approved or cleared by the [place] FDA and  has been authorized for detection and/or diagnosis of SARS-CoV-2 by FDA under an Emergency Use Authorization (EUA). This EUA will remain  in effect (meaning this test can be used) for the duration of  the COVID-19 declaration under Section 56 4(b)(1) of the Act, 21 U.S.C. section 360bbb-3(b)(1), unless the authorization is terminated or revoked sooner. Performed at [facility], [address] [place], [place] [address]   MRSA PCR Screening     Status: None   Collection Time: 12/28/18  9:42 PM   Specimen: Nasal Mucosa; Nasopharyngeal  Result Value Ref Range Status   MRSA by PCR NEGATIVE NEGATIVE Final    Comment:        The GeneXpert MRSA Assay (FDA approved for NASAL specimens only), is one component of a comprehensive MRSA colonization surveillance program. It is not intended to diagnose MRSA infection nor to guide or monitor treatment for MRSA infections. Performed at [facility], [address] [place], [place] [address]      Patient was seen and examined on the day of discharge and was found to be in stable condition. Time coordinating discharge: 25 minutes including assessment and coordination of care, as well as examination of the patient.   SIGNED:  Noralee StainJennifer Derreck Wiltsey, DO [facility] 12/30/2018, 11:19 AM

## 2018-12-30 NOTE — Progress Notes (Signed)
Physical Therapy Treatment Patient Details Name: Madeline Larsen MRN: [id number] DOB: 12-01-1985 Today's Date: 12/30/2018    History of Present Illness  Meara A Simm is a 33 y.o. adult-female to female transgender female in with medical history significant of polyarthralgia and myalgia, GERD, positive ANA and osteoarthritis presents to emergency department due to sudden onset of altered mental status. MRI negative for acute abnormality.    PT Comments    Session focused on gait training. Pt continues with gait abnormalities continuing left foot drag and decreased left stance time. Responds to cues well and is able to correct, but fatigues easily. Ambulating 50 feet with walker at a close supervision level. Education provided re: negotiating steps, endurance conservation strategies. Continue to recommend OPPT to address deficits and return to PLOF.     Follow Up Recommendations  Outpatient PT;Supervision for mobility/OOB     Equipment Recommendations  None recommended by PT    Recommendations for Other Services       Precautions / Restrictions Precautions Precautions: Fall Restrictions Weight Bearing Restrictions: No    Mobility  Bed Mobility               General bed mobility comments: Sitting EOB on arrival  Transfers Overall transfer level: Needs assistance Equipment used: Rolling walker (2 wheeled) Transfers: Sit to/from Stand Sit to Stand: Supervision            Ambulation/Gait Ambulation/Gait assistance: Supervision Gait Distance (Feet): 50 Feet Assistive device: Rolling walker (2 wheeled) Gait Pattern/deviations: Step-through pattern;Decreased step length - left;Decreased dorsiflexion - left Gait velocity: decreased   General Gait Details: Pt with heavy reliance through arms on walker, requiring close supervision for safety. Cues for scapular depression, left heel strike at initial contact, shorter right step length, activity pacing.   Stairs              Wheelchair Mobility    Modified Rankin (Stroke Patients Only) Modified Rankin (Stroke Patients Only) Pre-Morbid Rankin Score: No symptoms Modified Rankin: Moderately severe disability     Balance Overall balance assessment: Needs assistance Sitting-balance support: Feet supported Sitting balance-Leahy Scale: Good     Standing balance support: Bilateral upper extremity supported Standing balance-Leahy Scale: Fair                              Cognition Arousal/Alertness: Awake/alert Behavior During Therapy: WFL for tasks assessed/performed Overall Cognitive Status: Within Functional Limits for tasks assessed                                        Exercises      General Comments        Pertinent Vitals/Pain Pain Assessment: Faces Faces Pain Scale: Hurts little more Pain Location: headache Pain Intervention(s): Monitored during session    Home Living                      Prior Function            PT Goals (current goals can now be found in the care plan section) Acute Rehab PT Goals Patient Stated Goal: "get my balance back." PT Goal Formulation: With patient Time For Goal Achievement: 01/12/19 Potential to Achieve Goals: Good Progress towards PT goals: Progressing toward goals    Frequency    Min 3X/week  PT Plan Current plan remains appropriate    Co-evaluation              AM-PAC PT "6 Clicks" Mobility   Outcome Measure  Help needed turning from your back to your side while in a flat bed without using bedrails?: None Help needed moving from lying on your back to sitting on the side of a flat bed without using bedrails?: None Help needed moving to and from a bed to a chair (including a wheelchair)?: A Little Help needed standing up from a chair using your arms (e.g., wheelchair or bedside chair)?: A Little Help needed to walk in hospital room?: A Little Help needed climbing 3-5 steps  with a railing? : A Little 6 Click Score: 20    End of Session Equipment Utilized During Treatment: Gait belt Activity Tolerance: Patient tolerated treatment well Patient left: in bed;with call bell/phone within reach;with bed alarm set;with family/visitor present Nurse Communication: Mobility status PT Visit Diagnosis: Unsteadiness on feet (R26.81);Difficulty in walking, not elsewhere classified (R26.2)     Time: 2585-2778 PT Time Calculation (min) (ACUTE ONLY): 11 min  Charges:  $Gait Training: 8-22 mins                     Ellamae Sia, [place], DPT Acute Rehabilitation Services Pager [phone] Office [phone]    Willy Eddy 12/30/2018, 2:36 PM

## 2018-12-31 ENCOUNTER — Telehealth: Payer: Self-pay | Admitting: *Deleted

## 2018-12-31 NOTE — Telephone Encounter (Signed)
Transition Care Management Follow-up Telephone Call   Date discharged? 10.22.20   How have you been since you were released from the hospital? I left hospital w/ a new stutter and left sided weakness. They have referred me for OT and PT.    Do you understand why you were in the hospital? yes   Do you understand the discharge instructions? yes   Where were you discharged to? Home with family   Items Reviewed:  Medications reviewed: they decreased my gabapentin  Allergies reviewed: yes  Dietary changes reviewed: yes  Referrals reviewed: yes   Functional Questionnaire:   Activities of Daily Living (ADLs):   He states they are independent in the following: ambulation, bathing and hygiene, feeding, continence, grooming, toileting and dressing States they require assistance with the following: only thing new is using a walker now.   Any transportation issues/concerns?: no   Any patient concerns? no   Confirmed importance and date/time of follow-up visits scheduled yes  Provider Appointment booked with 01/03/19  Confirmed with patient if condition begins to worsen call PCP or go to the ER.  Patient was given the office number and encouraged to call back with question or concerns.  : yes

## 2019-01-04 ENCOUNTER — Encounter: Payer: Self-pay | Admitting: Nurse Practitioner

## 2019-01-04 ENCOUNTER — Other Ambulatory Visit: Payer: Self-pay

## 2019-01-04 ENCOUNTER — Ambulatory Visit (INDEPENDENT_AMBULATORY_CARE_PROVIDER_SITE_OTHER): Payer: No Typology Code available for payment source | Admitting: Nurse Practitioner

## 2019-01-04 ENCOUNTER — Telehealth: Payer: Self-pay | Admitting: Neurology

## 2019-01-04 VITALS — BP 108/82 | HR 72 | Temp 98.3°F | Ht 62.0 in | Wt 250.8 lb

## 2019-01-04 DIAGNOSIS — R7989 Other specified abnormal findings of blood chemistry: Secondary | ICD-10-CM

## 2019-01-04 DIAGNOSIS — K5904 Chronic idiopathic constipation: Secondary | ICD-10-CM

## 2019-01-04 DIAGNOSIS — R270 Ataxia, unspecified: Secondary | ICD-10-CM | POA: Diagnosis not present

## 2019-01-04 MED ORDER — LUBIPROSTONE 8 MCG PO CAPS: 8.0000 ug | ORAL_CAPSULE | Freq: Two times a day (BID) | ORAL | 5 refills | Status: DC

## 2019-01-04 MED ORDER — LACTULOSE 10 GM/15ML PO SOLN: 20.0000 g | Freq: Every day | ORAL | 0 refills | Status: DC | PRN

## 2019-01-04 NOTE — Telephone Encounter (Signed)
Called, offered appt 01/06/19 at 4pm, check in 330pm with Dr. Felecia Shelling. Asked him to call back to let us know if this worked.

## 2019-01-04 NOTE — Telephone Encounter (Signed)
Pt called in and stated she was seen in the ED and was told to contact Dr Felecia Shelling to follow up due to medication reaction and her speech slurring and a new symptom of foot drop

## 2019-01-04 NOTE — Telephone Encounter (Signed)
Called and spoke with pt. They accepted appt for 01/06/19 at 4pm. I scheduled. Advised to check in at 330pm, wear mask. Allowed once visitor.

## 2019-01-04 NOTE — Progress Notes (Signed)
Subjective:  Patient ID: [name] A Kooyman, adult    DOB: 1985-04-25  Age: 33 y.o. MRN: [id number]  CC: Hospitalization Follow-up (ED follow up on nausea,fatigue--- ED changed gabapentin dosage---lyrica consult--wife notice slow speech. )  Constipation This is a chronic problem. The current episode started more than 1 year ago. The problem is unchanged. The stool is described as firm and pellet like. The patient is not on a high fiber diet. He does not exercise regularly. There has been adequate water intake. Associated symptoms include abdominal pain and bloating. Pertinent negatives include no diarrhea, fecal incontinence, hematochezia, hemorrhoids, melena, nausea, rectal pain or vomiting. Risk factors include obesity, immobility, recent illness and stress. He has tried laxatives, stool softeners and mineral oil for the symptoms. The treatment provided moderate relief. His past medical history is significant for irritable bowel syndrome and neurologic disease.   Accompanied by wife. Admission:12/28/2018 Discharged:12/30/2018 Angus PalmsKai presented to ED with elevated BP, slurred speech and altered mental status. Cause of his symptoms are not clear. It is suspected it due to gabapentin that was initiated by neurology for back pain. Gabapentin dose was decreased from 1200mg  total to 400mg  total. His wife reports improved movement and mental status, but persistent slurred speech, unsteady gait and blank stare. He is to start PT and OT at home, but has been contact yet. He has F/up appt with neurology 01/06/2019.He Reports FHx of polymyositis (father, deceased at age 10840). He is also being seen by rheumatology due to abnormal ANA and polyarthralgia. Plaquenil was prescribed. I reviewed lab results and diagnostic imaging completed during his hospitalization: no acute infection or infarct or mass or seizure activity. UDS indicated only marijuana. TSH was found to be slightly low and needs to be repeated in  4-6weeks). Today he denies any ETOH or marijuana or illicit drug use since discharge.  Reviewed past Medical, Social and Family history today.  Outpatient Medications Prior to Visit  Medication Sig Dispense Refill   acetaminophen (TYLENOL) 500 MG tablet Take 1,000 mg by mouth every 6 (six) hours as needed for pain.     etodolac (LODINE) 400 MG tablet Take 1 tablet (400 mg total) by mouth 2 (two) times daily. 60 tablet 5   gabapentin (NEURONTIN) 100 MG capsule One po qAM; one po qPM and two po qHS 120 capsule 0   hydroxychloroquine (PLAQUENIL) 200 MG tablet TAKE 1 TABLET BY MOUTH TWICE A DAY (Patient taking differently: Take 200 mg by mouth 2 (two) times daily. ) 180 tablet 0   hyoscyamine (LEVSIN, ANASPAZ) 0.125 MG tablet Take 1 tablet (0.125 mg total) by mouth every 6 (six) hours as needed for cramping. 30 tablet 1   ibuprofen (ADVIL,MOTRIN) 200 MG tablet Take 200 mg by mouth as needed for moderate pain.      ondansetron (ZOFRAN ODT) 8 MG disintegrating tablet Take 1 tablet (8 mg total) by mouth every 8 (eight) hours as needed for nausea or vomiting. 20 tablet 0   testosterone cypionate (DEPOTESTOSTERONE CYPIONATE) 200 MG/ML injection Inject 0.4 mLs (80 mg total) into the muscle once a week. 10 mL 0   lactulose (CHRONULAC) 10 GM/15ML solution Take 30 mLs (20 g total) by mouth daily as needed for severe constipation. 236 mL 0   No facility-administered medications prior to visit.    ROS See HPI  Objective:  BP 108/82    Pulse 72    Temp 98.3 F (36.8 C) (Tympanic)    Ht 5\' 2"  (1.575 m)  Wt 250 lb 12.8 oz (113.8 kg)    SpO2 99%    BMI 45.87 kg/m   BP Readings from Last 3 Encounters:  01/04/19 108/82  12/30/18 137/71  12/16/18 (!) 129/91   Wt Readings from Last 3 Encounters:  01/04/19 250 lb 12.8 oz (113.8 kg)  12/28/18 243 lb 6.2 oz (110.4 kg)  12/16/18 248 lb 8 oz (112.7 kg)   Physical Exam Vitals signs reviewed.  Constitutional:      Appearance: He is obese.   Cardiovascular:     Rate and Rhythm: Normal rate and regular rhythm.     Pulses: Normal pulses.     Heart sounds: Normal heart sounds.  Skin:    General: Skin is warm and dry.  Neurological:     Mental Status: He is alert and oriented to person, place, and time.     Cranial Nerves: Dysarthria present. No facial asymmetry.     Sensory: Sensory deficit present.     Motor: Weakness present. No tremor.     Coordination: Coordination abnormal. Finger-Nose-Finger Test abnormal and Heel to [facility] Test abnormal.     Gait: Gait abnormal and tandem walk abnormal.     Deep Tendon Reflexes: Reflexes abnormal. Babinski sign absent on the right side. Babinski sign absent on the left side.     Comments: Ataxic gait. Unable to ellicit any LE reflex Muscle weakness (L>R).  Psychiatric:        Attention and Perception: Attention normal.        Mood and Affect: Affect is flat.        Speech: Speech is delayed.        Behavior: Behavior is cooperative.        Cognition and Memory: He exhibits impaired recent memory.     Lab Results  Component Value Date   WBC 3.6 (L) 12/28/2018   HGB 13.6 12/28/2018   HCT 43.8 12/28/2018   PLT 238 12/28/2018   GLUCOSE 96 12/28/2018   CHOL 125 01/26/2018   TRIG 76.0 01/26/2018   HDL 34.80 (L) 01/26/2018   LDLCALC 75 01/26/2018   ALT 15 12/28/2018   AST 23 12/28/2018   NA 138 12/28/2018   K 4.0 12/28/2018   CL 104 12/28/2018   CREATININE 0.88 12/28/2018   BUN 7 12/28/2018   CO2 24 12/28/2018   TSH 0.203 (L) 12/28/2018   INR 1.1 (H) 01/26/2018   HGBA1C 6.1 (H) 12/28/2018   Ct Head Wo Contrast  Result Date: 12/28/2018 CLINICAL DATA:  33 year old female with altered mental status. EXAM: CT HEAD WITHOUT CONTRAST TECHNIQUE: Contiguous axial images were obtained from the base of the skull through the vertex without intravenous contrast. COMPARISON:  Brain MRI dated 04/06/2014 FINDINGS: Brain: No evidence of acute infarction, hemorrhage, hydrocephalus,  extra-axial collection or mass lesion/mass effect. Vascular: No hyperdense vessel or unexpected calcification. Skull: Normal. Negative for fracture or focal lesion. Sinuses/Orbits: No acute finding. Other: None IMPRESSION: Normal noncontrast CT of the brain. Electronically Signed   By: Anner Crete M.D.   On: 12/28/2018 13:27   Mr Brain Wo Contrast  Result Date: 12/29/2018 CLINICAL DATA:  33 year old female with positive ANA arthritis. Sudden onset altered mental status. Hypertensive on presentation (180/80). Slurred speech, confusion. EXAM: MRI HEAD WITHOUT CONTRAST TECHNIQUE: Multiplanar, multiecho pulse sequences of the brain and surrounding structures were obtained without intravenous contrast. COMPARISON:  Head CT 12/28/2018. Brain MRI 04/06/2014. FINDINGS: Brain: Normal cerebral volume. No restricted diffusion to suggest acute infarction. No midline shift,  mass effect, evidence of mass lesion, ventriculomegaly, extra-axial collection or acute intracranial hemorrhage. Cervicomedullary junction and pituitary are within normal limits. Wallace Cullens and white matter signal is within normal limits throughout the brain. Incidental small right choroid plexus cyst (normal variant). No chronic cerebral blood products or encephalomalacia identified Vascular: Major intracranial vascular flow voids are stable since 2016. Skull and upper cervical spine: Negative visible cervical spine. Visualized bone marrow signal is within normal limits. Sinuses/Orbits: Negative orbits. Small right maxillary sinus mucous retention cyst is chronic. Other: Mastoids remain clear. Visible internal auditory structures appear normal. Scalp and face soft tissues appear negative. IMPRESSION: No acute intracranial abnormality. Stable since 2016 and normal noncontrast MRI appearance of the brain. Electronically Signed   By: Odessa Fleming M.D.   On: 12/29/2018 09:41   US Abdomen Complete  Result Date: 12/28/2018 CLINICAL DATA:  Diffuse, chronic  abdominal pain. EXAM: ABDOMEN ULTRASOUND COMPLETE COMPARISON:  CT abdomen and pelvis 02/16/2017 abdominal ultrasound 08/31/2013. FINDINGS: Gallbladder: No gallstones or wall thickening visualized. No sonographic Murphy sign noted by sonographer. Common bile duct: Diameter: 0.4 cm Liver: No focal lesion identified. Within normal limits in parenchymal echogenicity. Portal vein is patent on color Doppler imaging with normal direction of blood flow towards the liver. IVC: No abnormality visualized. Pancreas: Visualized portion unremarkable. Spleen: Size and appearance within normal limits. Right Kidney: Length: 10.3 cm. Echogenicity within normal limits. No mass or hydronephrosis visualized. Left Kidney: Length: 10.1 cm. Echogenicity within normal limits. No mass or hydronephrosis visualized. Abdominal aorta: No aneurysm visualized. Other findings: None. IMPRESSION: Normal examination. Electronically Signed   By: Drusilla Kanner M.D.   On: 12/28/2018 15:13    Assessment & Plan:   Lenee was seen today for hospitalization follow-up.  Diagnoses and all orders for this visit:  Ataxia  Chronic idiopathic constipation -     lubiprostone (AMITIZA) 8 MCG capsule; Take 1 capsule (8 mcg total) by mouth 2 (two) times daily with a meal. -     lactulose (CHRONULAC) 10 GM/15ML solution; Take 30 mLs (20 g total) by mouth daily as needed for severe constipation.  Low TSH level -     TSH; Future -     T3, free; Future -     T4, free; Future   I am having Aamirah A. Shrieves "[place]" start on lubiprostone. I am also having him maintain his acetaminophen, ibuprofen, hyoscyamine, testosterone cypionate, ondansetron, etodolac, hydroxychloroquine, gabapentin, and lactulose.  Meds ordered this encounter  Medications   lubiprostone (AMITIZA) 8 MCG capsule    Sig: Take 1 capsule (8 mcg total) by mouth 2 (two) times daily with a meal.    Dispense:  60 capsule    Refill:  5    Order Specific Question:   Supervising  Provider    Answer:   Dianne Dun [[id number]]   lactulose (CHRONULAC) 10 GM/15ML solution    Sig: Take 30 mLs (20 g total) by mouth daily as needed for severe constipation.    Dispense:  236 mL    Refill:  0    Order Specific Question:   Supervising Provider    Answer:   Dianne Dun [[id number]]   Problem List Items Addressed This Visit      Digestive   Chronic idiopathic constipation   Relevant Medications   lubiprostone (AMITIZA) 8 MCG capsule   lactulose (CHRONULAC) 10 GM/15ML solution    Other Visit Diagnoses    Ataxia    -  Primary   Low TSH  level       Relevant Orders   TSH   T3, free   T4, free      Follow-up: Return if symptoms worsen or fail to improve.  Alysia Penna, NP

## 2019-01-04 NOTE — Patient Instructions (Signed)
Let me know if you are not contacted by PT and OT.  Call neurology for f/up appt.  Maintain current medications.

## 2019-01-05 ENCOUNTER — Telehealth: Payer: Self-pay | Admitting: Nurse Practitioner

## 2019-01-05 ENCOUNTER — Ambulatory Visit: Payer: No Typology Code available for payment source | Admitting: Rheumatology

## 2019-01-05 NOTE — Telephone Encounter (Signed)
Patient wife came in and dropped off FMLA paper work to be filled out. Forms are in provider folder at the front. Please call patient when forms are completed.

## 2019-01-05 NOTE — Telephone Encounter (Signed)
Working on the form.

## 2019-01-06 ENCOUNTER — Encounter: Payer: Self-pay | Admitting: Nurse Practitioner

## 2019-01-06 ENCOUNTER — Other Ambulatory Visit: Payer: Self-pay

## 2019-01-06 ENCOUNTER — Encounter: Payer: Self-pay | Admitting: Neurology

## 2019-01-06 ENCOUNTER — Ambulatory Visit: Payer: No Typology Code available for payment source | Admitting: Neurology

## 2019-01-06 VITALS — BP 127/79 | HR 65 | Temp 97.9°F | Ht 62.0 in | Wt 252.0 lb

## 2019-01-06 DIAGNOSIS — M791 Myalgia, unspecified site: Secondary | ICD-10-CM | POA: Diagnosis not present

## 2019-01-06 DIAGNOSIS — R269 Unspecified abnormalities of gait and mobility: Secondary | ICD-10-CM

## 2019-01-06 DIAGNOSIS — R200 Anesthesia of skin: Secondary | ICD-10-CM

## 2019-01-06 DIAGNOSIS — R404 Transient alteration of awareness: Secondary | ICD-10-CM

## 2019-01-06 NOTE — Progress Notes (Signed)
[facility]  PATIENT: Madeline Larsen DOB: 29-Sep-1985  REFERRING DOCTOR OR PCP: Dr. Estanislado Pandy (rheumatology), Dr. Lorayne Marek (PCP) SOURCE: Patient, notes from Dr. Estanislado Pandy, laboratory reports  _________________________________   HISTORICAL  CHIEF COMPLAINT:  Chief Complaint  Patient presents with   Follow-up    Myalgia follow up for hosptal visit  with Madeline Larsen and her temp  97.4    HISTORY OF PRESENT ILLNESS:  Madeline Larsen  is a 33 year old female to female transgender man with polyarthralgia and myalgias.  Update 01/06/2019: He presented to the ED 12/28/18.  He felt fine in the morning and pain was doing much better since starting gabapentin with near resolution of the neuralgias.   While at work, he started feeling worse and coworkers noted the eyes looked bloodshot.   He felt confused and was taken to the ED.   BP was elevated at 180/100.   In the ED he had an MRI and was read as normal..  I personally reviewed and concur with the interpretation.   EEG was normal.     He was kept x 2 days and discharged.     The gabapentin was held.   The next day he was doing better and he was discharged the following day on a lower dose of gabapentin 100-100-200.  He feels that dose is helping his pain as much as the higher dose.     Currently, he reports mild weakness in the left leg and notes a little stutter and slowing at times with speech.    Sleep is better at night than last visit.  From 12/16/18: He has a long history of pain in his legs and ankles and this has progressed over the last year..    He has seen Dr. Estanislado Pandy of rheumatology.  Some of his rheumatologic tests have been abnormal but there is no definite diagnosis.Marland Kitchen   He notes his pain is worse in the joints than muscles,especially the knees.   He has mild shoulder pain but otherwise arms feel fine most of the time,  Dr. Estanislado Pandy placed him on hydroxychloroquine.  He takes 400 mg ibuprofen and acetaminophen 1000 mg  every 4-6 hours.      He notes mild weakness in his thighs and calves.   Arms are strong.  He notes no change in bladder function.  He denies much back pain but he does report back pain.  There is no radiating pain from the back no numbness in the arms or legs.  He is sleeping poorly at night.  Many nights he just gets 4 hours.   He snores some but has never been told there are pauses or gasps.   He denies depression.     I reviewed labs:   ESR, CBC, CMP; RF; HLAB27; CCP; Scl-70/ENA; C3/C4, Beta-2 glycoprotein; anti-cardiolipin; lupus anticoagulant; G6PDH, HepB, HepC were all negative or normal.     CK minimally elevated at 266; dsDNA Ab is indeterminate at 7; SSA (Ro) was positive at 8.0; TSH was mildly low at 0.31; uric acid mildly elevated at 7.8; ACE is mildly elevated at 87.  Xrays of knees show moderate osteoarthritis.     REVIEW OF SYSTEMS: Constitutional: No fevers, chills, sweats, or change in appetite.  Poor sleep Eyes: No visual changes, double vision, eye pain Ear, nose and throat: No hearing loss, ear pain, nasal congestion, sore throat Cardiovascular: No chest pain, palpitations Respiratory: No shortness of breath at rest or with exertion.   No wheezes GastrointestinaI:  No nausea, vomiting, diarrhea, abdominal pain, fecal incontinence Genitourinary: No dysuria, urinary retention or frequency.  No nocturia. Musculoskeletal:As above Integumentary: No rash, pruritus, skin lesions Neurological: as above Psychiatric: No depression at this time.  No anxiety Endocrine: No palpitations, diaphoresis, change in appetite, change in weigh or increased thirst Hematologic/Lymphatic: No anemia, purpura, petechiae. Allergic/Immunologic: No itchy/runny eyes, nasal congestion, recent allergic reactions, rashes  ALLERGIES: No Known Allergies  HOME MEDICATIONS:  Current Outpatient Medications:    acetaminophen (TYLENOL) 500 MG tablet, Take 1,000 mg by mouth every 6 (six) hours as  needed for pain., Disp: , Rfl:    etodolac (LODINE) 400 MG tablet, Take 1 tablet (400 mg total) by mouth 2 (two) times daily., Disp: 60 tablet, Rfl: 5   gabapentin (NEURONTIN) 100 MG capsule, One po qAM; one po qPM and two po qHS, Disp: 120 capsule, Rfl: 0   hydroxychloroquine (PLAQUENIL) 200 MG tablet, TAKE 1 TABLET BY MOUTH TWICE A DAY (Patient taking differently: Take 200 mg by mouth 2 (two) times daily. ), Disp: 180 tablet, Rfl: 0   hyoscyamine (LEVSIN, ANASPAZ) 0.125 MG tablet, Take 1 tablet (0.125 mg total) by mouth every 6 (six) hours as needed for cramping., Disp: 30 tablet, Rfl: 1   ibuprofen (ADVIL,MOTRIN) 200 MG tablet, Take 200 mg by mouth as needed for moderate pain. , Disp: , Rfl:    lactulose (CHRONULAC) 10 GM/15ML solution, Take 30 mLs (20 g total) by mouth daily as needed for severe constipation., Disp: 236 mL, Rfl: 0   lubiprostone (AMITIZA) 8 MCG capsule, Take 1 capsule (8 mcg total) by mouth 2 (two) times daily with a meal., Disp: 60 capsule, Rfl: 5   ondansetron (ZOFRAN ODT) 8 MG disintegrating tablet, Take 1 tablet (8 mg total) by mouth every 8 (eight) hours as needed for nausea or vomiting., Disp: 20 tablet, Rfl: 0   testosterone cypionate (DEPOTESTOSTERONE CYPIONATE) 200 MG/ML injection, Inject 0.4 mLs (80 mg total) into the muscle once a week., Disp: 10 mL, Rfl: 0  PAST MEDICAL HISTORY: Past Medical History:  Diagnosis Date   Depression    Female-to-female transgender person    GERD (gastroesophageal reflux disease)    MRSA (methicillin resistant staph aureus) culture positive     PAST SURGICAL HISTORY: Past Surgical History:  Procedure Laterality Date   FOOT SURGERY Left    top surgery  02/2018    FAMILY HISTORY: Family History  Problem Relation Age of Onset   Alcohol abuse Mother    Cancer Mother 19       Uterine cancer   Drug abuse Mother    HIV/AIDS Mother    Early death Mother 27       uterine cancer   Alcohol abuse Father     Drug abuse Father    Polymyositis Father 41       deceased at age 80   Early death Father 35       polymyositis   Alcohol abuse Sister    Depression Sister    Drug abuse Sister    Depression Brother    Kidney disease Maternal Grandmother        ESRD on dialysis   Hypertension Maternal Grandmother    Rheum arthritis Paternal Grandmother    Rheum arthritis Brother    Depression Sister    Depression Sister    Depression Sister    Lupus Cousin    Lupus Cousin    Lupus Cousin     SOCIAL HISTORY:  Social History  Socioeconomic History   Marital status: Married    Spouse name: Not on file   Number of children: 2   Years of education: Not on file   Highest education level: High school graduate  Occupational History   Not on file  Social Needs   Financial resource strain: Not on file   Food insecurity    Worry: Not on file    Inability: Not on file   Transportation needs    Medical: Not on file    Non-medical: Not on file  Tobacco Use   Smoking status: Never Smoker   Smokeless tobacco: Never Used  Substance and Sexual Activity   Alcohol use: Yes    Alcohol/week: 0.0 standard drinks    Comment: socially   Drug use: Not Currently    Types: Marijuana   Sexual activity: Yes    Birth control/protection: None    Comment: female partner  Lifestyle   Physical activity    Days per week: Not on file    Minutes per session: Not on file   Stress: Not on file  Relationships   Social connections    Talks on phone: Not on file    Gets together: Not on file    Attends religious service: Not on file    Active member of club or organization: Not on file    Attends meetings of clubs or organizations: Not on file    Relationship status: Not on file   Intimate partner violence    Fear of current or ex partner: Not on file    Emotionally abused: Not on file    Physically abused: Not on file    Forced sexual activity: Not on file  Other Topics  Concern   Not on file  Social History Narrative   Lives at home with wife & kids   Right handed     PHYSICAL EXAM  Vitals:   01/06/19 1603  BP: 127/79  Pulse: 65  Temp: 97.9 F (36.6 C)  Weight: 252 lb (114.3 kg)  Height: '5\' 2"'  (1.575 m)    Body mass index is 46.09 kg/m.   General: The patient is in no acute distress  HEENT:  Head is [place]/AT.  Sclera are anicteric.     Skin: Extremities are without rash or edema.  Musculoskeletal:  Currently tenderness in muscles has resolved  Neurologic Exam  Mental status: The patient is alert and oriented x 3 at the time of the examination. The patient has apparent normal recent and remote memory, with an apparently normal attention span and concentration ability.   Speech is normal.  Cranial nerves: Extraocular movements are full. Pupils are equal, round, and reactive to light and accomodation.  . There is good facial sensation to soft touch bilaterally.Facial strength is normal.  Trapezius and sternocleidomastoid strength is normal. No dysarthria is noted. . No obvious hearing deficits are noted.  Motor:  Muscle bulk is normal.   Tone is normal. Strength is  5 / 5 in all 4 extremities.   Sensory: Touch and vibration both feel more tingly on the left.  Coordination: Cerebellar testing reveals good finger-nose-finger and heel-to-shin bilaterally.  Gait and station: Station is normal.   Gait is mildly wide and the left foot dragged some.   Can't  Tandem walk.  Romberg is negative.   Reflexes: Deep tendon reflexes are symmetric and normal bilaterally.       DIAGNOSTIC DATA (LABS, IMAGING, TESTING) - I reviewed patient records, labs, notes,  testing and imaging myself where available.  Lab Results  Component Value Date   WBC 3.6 (L) 12/28/2018   HGB 13.6 12/28/2018   HCT 43.8 12/28/2018   MCV 89.0 12/28/2018   PLT 238 12/28/2018      Component Value Date/Time   NA 138 12/28/2018 1013   K 4.0 12/28/2018 1013   CL 104  12/28/2018 1013   CO2 24 12/28/2018 1013   GLUCOSE 96 12/28/2018 1013   BUN 7 12/28/2018 1013   CREATININE 0.88 12/28/2018 1013   CREATININE 0.89 12/02/2018 1019   CALCIUM 9.1 12/28/2018 1013   PROT 7.3 12/28/2018 1013   ALBUMIN 3.8 12/28/2018 1013   AST 23 12/28/2018 1013   ALT 15 12/28/2018 1013   ALKPHOS 50 12/28/2018 1013   BILITOT 0.4 12/28/2018 1013   GFRNONAA >60 12/28/2018 1013   GFRNONAA 85 12/02/2018 1019   GFRAA >60 12/28/2018 1013   GFRAA 98 12/02/2018 1019   Lab Results  Component Value Date   CHOL 125 01/26/2018   HDL 34.80 (L) 01/26/2018   LDLCALC 75 01/26/2018   TRIG 76.0 01/26/2018   CHOLHDL 4 01/26/2018   Lab Results  Component Value Date   HGBA1C 6.1 (H) 12/28/2018   Lab Results  Component Value Date   VITAMINB12 406 12/28/2018   Lab Results  Component Value Date   TSH 0.203 (L) 12/28/2018       ASSESSMENT AND PLAN  Gait disturbance - Plan: Ambulatory referral to Physical Therapy, MR CERVICAL SPINE WO CONTRAST  Myalgia - Plan: Ambulatory referral to Physical Therapy  Left sided numbness - Plan: MR CERVICAL SPINE WO CONTRAST  Transient alteration of awareness   1.   Pain continues to do much better with gabapentin and he will continue the dose of 100 mg in the morning, 100 mg in the afternoon and 200 mg at night. 2.    Etiology of the worsening gait since the hospitalization is unclear.  I will check an MRI of the cervical spine to determine if there is an extrinsic or intrinsic myelopathy that might be contributing to the symptoms.   3.    Refer to physical therapy for myalgias and gait.    Madeline Larsen A. Felecia Shelling, MD, [facility] 65/99/3570, 1:77 PM Certified in Neurology, Clinical Neurophysiology, Sleep Medicine and Neuroimaging  [facility] [address] [place], [place] [address] [phone]

## 2019-01-11 NOTE — Telephone Encounter (Signed)
Paper work completed, pt will come pick it up--place up front.

## 2019-01-13 ENCOUNTER — Telehealth: Payer: Self-pay | Admitting: Neurology

## 2019-01-13 NOTE — Telephone Encounter (Signed)
[employer]: NPR spoke to Laurine Blazer Ref # [id number] order sent to GI. They will reach out to the patient to schedule.

## 2019-01-14 ENCOUNTER — Encounter: Payer: Self-pay | Admitting: Nurse Practitioner

## 2019-01-17 ENCOUNTER — Telehealth: Payer: Self-pay

## 2019-01-17 NOTE — Telephone Encounter (Signed)

## 2019-01-18 ENCOUNTER — Other Ambulatory Visit (INDEPENDENT_AMBULATORY_CARE_PROVIDER_SITE_OTHER): Payer: No Typology Code available for payment source

## 2019-01-18 ENCOUNTER — Ambulatory Visit (INDEPENDENT_AMBULATORY_CARE_PROVIDER_SITE_OTHER): Payer: No Typology Code available for payment source | Admitting: Nurse Practitioner

## 2019-01-18 ENCOUNTER — Encounter: Payer: Self-pay | Admitting: Physical Therapy

## 2019-01-18 ENCOUNTER — Other Ambulatory Visit: Payer: Self-pay

## 2019-01-18 ENCOUNTER — Ambulatory Visit: Payer: No Typology Code available for payment source | Admitting: Physical Therapy

## 2019-01-18 ENCOUNTER — Encounter: Payer: Self-pay | Admitting: Nurse Practitioner

## 2019-01-18 ENCOUNTER — Ambulatory Visit: Payer: Self-pay | Admitting: *Deleted

## 2019-01-18 VITALS — BP 151/112 | HR 66

## 2019-01-18 VITALS — BP 144/94 | HR 71 | Temp 97.7°F | Ht 62.0 in | Wt 249.6 lb

## 2019-01-18 DIAGNOSIS — R7989 Other specified abnormal findings of blood chemistry: Secondary | ICD-10-CM | POA: Diagnosis not present

## 2019-01-18 DIAGNOSIS — F4321 Adjustment disorder with depressed mood: Secondary | ICD-10-CM | POA: Diagnosis not present

## 2019-01-18 DIAGNOSIS — R030 Elevated blood-pressure reading, without diagnosis of hypertension: Secondary | ICD-10-CM | POA: Diagnosis not present

## 2019-01-18 LAB — T4, FREE: Free T4: 0.69 ng/dL (ref 0.60–1.60)

## 2019-01-18 LAB — TSH: TSH: 0.51 u[IU]/mL (ref 0.35–4.50)

## 2019-01-18 LAB — T3, FREE: T3, Free: 3.6 pg/mL (ref 2.3–4.2)

## 2019-01-18 MED ORDER — AMLODIPINE BESYLATE 5 MG PO TABS: 5.0000 mg | ORAL_TABLET | Freq: Every day | ORAL | 1 refills | Status: DC

## 2019-01-18 NOTE — Therapy (Signed)
[facility] [address] [place], [place], [address] Phone: [phone]   Fax:  [phone]  Patient Details  Name: Madeline Larsen MRN: [id number] Date of Birth: 01-17-1986 Referring Provider:  Britt Bottom, MD  Encounter Date: 01/18/2019  Pt presented to OPPT evaluation today; however, pt's blood pressure readings of 140/99 and 147/99 indicate that PT should not proceed with physical activity.  Evaluation not completed today.  PT phoned pt's MD for guidance.  Today's Vitals   01/18/19 0945 01/18/19 0957 01/18/19 1022  BP: (!) 140/99 (!) 147/99 (!) 151/112  Pulse:   66   There is no height or weight on file to calculate BMI. Kimberl Vig W. 01/18/2019, 10:16 AM  Frazier Butt., PT   [facility] [address] [place], [place], [address] Phone: [phone]   Fax:  [phone]

## 2019-01-18 NOTE — Patient Instructions (Addendum)
Start amlodipine F/up in 1week for BP check Maintain DASH diet   DASH Eating Plan DASH stands for "Dietary Approaches to Stop Hypertension." The DASH eating plan is a healthy eating plan that has been shown to reduce high blood pressure (hypertension). It may also reduce your risk for type 2 diabetes, heart disease, and stroke. The DASH eating plan may also help with weight loss. What are tips for following this plan?  General guidelines  Avoid eating more than 2,300 mg (milligrams) of salt (sodium) a day. If you have hypertension, you may need to reduce your sodium intake to 1,500 mg a day.  Limit alcohol intake to no more than 1 drink a day for nonpregnant women and 2 drinks a day for men. One drink equals 12 oz of beer, 5 oz of wine, or 1 oz of hard liquor.  Work with your health care provider to maintain a healthy body weight or to lose weight. Ask what an ideal weight is for you.  Get at least 30 minutes of exercise that causes your heart to beat faster (aerobic exercise) most days of the week. Activities may include walking, swimming, or biking.  Work with your health care provider or diet and nutrition specialist (dietitian) to adjust your eating plan to your individual calorie needs. Reading food labels   Check food labels for the amount of sodium per serving. Choose foods with less than 5 percent of the Daily Value of sodium. Generally, foods with less than 300 mg of sodium per serving fit into this eating plan.  To find whole grains, look for the word "whole" as the first word in the ingredient list. Shopping  Buy products labeled as "low-sodium" or "no salt added."  Buy fresh foods. Avoid canned foods and premade or frozen meals. Cooking  Avoid adding salt when cooking. Use salt-free seasonings or herbs instead of table salt or sea salt. Check with your health care provider or pharmacist before using salt substitutes.  Do not fry foods. Cook foods using healthy methods  such as baking, boiling, grilling, and broiling instead.  Cook with heart-healthy oils, such as olive, canola, soybean, or sunflower oil. Meal planning  Eat a balanced diet that includes: ? 5 or more servings of fruits and vegetables each day. At each meal, try to fill half of your plate with fruits and vegetables. ? Up to 6-8 servings of whole grains each day. ? Less than 6 oz of lean meat, poultry, or fish each day. A 3-oz serving of meat is about the same size as a deck of cards. One egg equals 1 oz. ? 2 servings of low-fat dairy each day. ? A serving of nuts, seeds, or beans 5 times each week. ? Heart-healthy fats. Healthy fats called Omega-3 fatty acids are found in foods such as flaxseeds and coldwater fish, like sardines, salmon, and mackerel.  Limit how much you eat of the following: ? Canned or prepackaged foods. ? Food that is high in trans fat, such as fried foods. ? Food that is high in saturated fat, such as fatty meat. ? Sweets, desserts, sugary drinks, and other foods with added sugar. ? Full-fat dairy products.  Do not salt foods before eating.  Try to eat at least 2 vegetarian meals each week.  Eat more home-cooked food and less restaurant, buffet, and fast food.  When eating at a restaurant, ask that your food be prepared with less salt or no salt, if possible. What foods are recommended? The  items listed may not be a complete list. Talk with your dietitian about what dietary choices are best for you. Grains Whole-grain or whole-wheat bread. Whole-grain or whole-wheat pasta. Brown rice. [place]. Bulgur. Whole-grain and low-sodium cereals. Pita bread. Low-fat, low-sodium crackers. Whole-wheat flour tortillas. Vegetables Fresh or frozen vegetables (raw, steamed, roasted, or grilled). Low-sodium or reduced-sodium tomato and vegetable juice. Low-sodium or reduced-sodium tomato sauce and tomato paste. Low-sodium or reduced-sodium canned vegetables. Fruits All  fresh, dried, or frozen fruit. Canned fruit in natural juice (without added sugar). Meat and other protein foods Skinless chicken or [place]. Ground chicken or [place]. Pork with fat trimmed off. Fish and seafood. Egg whites. Dried beans, peas, or lentils. Unsalted nuts, nut butters, and seeds. Unsalted canned beans. Lean cuts of beef with fat trimmed off. Low-sodium, lean deli meat. Dairy Low-fat (1%) or fat-free (skim) milk. Fat-free, low-fat, or reduced-fat cheeses. Nonfat, low-sodium ricotta or cottage cheese. Low-fat or nonfat yogurt. Low-fat, low-sodium cheese. Fats and oils Soft margarine without trans fats. Vegetable oil. Low-fat, reduced-fat, or light mayonnaise and salad dressings (reduced-sodium). Canola, safflower, olive, soybean, and sunflower oils. Avocado. Seasoning and other foods Herbs. Spices. Seasoning mixes without salt. Unsalted popcorn and pretzels. Fat-free sweets. What foods are not recommended? The items listed may not be a complete list. Talk with your dietitian about what dietary choices are best for you. Grains Baked goods made with fat, such as croissants, muffins, or some breads. Dry pasta or rice meal packs. Vegetables Creamed or fried vegetables. Vegetables in a cheese sauce. Regular canned vegetables (not low-sodium or reduced-sodium). Regular canned tomato sauce and paste (not low-sodium or reduced-sodium). Regular tomato and vegetable juice (not low-sodium or reduced-sodium). Angie Fava. Olives. Fruits Canned fruit in a light or heavy syrup. Fried fruit. Fruit in cream or butter sauce. Meat and other protein foods Fatty cuts of meat. Ribs. Fried meat. Berniece Salines. Sausage. Bologna and other processed lunch meats. Salami. Fatback. Hotdogs. Bratwurst. Salted nuts and seeds. Canned beans with added salt. Canned or smoked fish. Whole eggs or egg yolks. Chicken or [place] with skin. Dairy Whole or 2% milk, cream, and half-and-half. Whole or full-fat cream cheese. Whole-fat or  sweetened yogurt. Full-fat cheese. Nondairy creamers. Whipped toppings. Processed cheese and cheese spreads. Fats and oils Butter. Stick margarine. Lard. Shortening. Ghee. Bacon fat. Tropical oils, such as coconut, palm kernel, or palm oil. Seasoning and other foods Salted popcorn and pretzels. Onion salt, garlic salt, seasoned salt, table salt, and sea salt. Worcestershire sauce. Tartar sauce. Barbecue sauce. Teriyaki sauce. Soy sauce, including reduced-sodium. Steak sauce. Canned and packaged gravies. Fish sauce. Oyster sauce. Cocktail sauce. Horseradish that you find on the shelf. Ketchup. Mustard. Meat flavorings and tenderizers. Bouillon cubes. Hot sauce and Tabasco sauce. Premade or packaged marinades. Premade or packaged taco seasonings. Relishes. Regular salad dressings. Where to find more information:  National Heart, Lung, and [place]: https://wilson-eaton.com/  American Heart Association: www.heart.org Summary  The DASH eating plan is a healthy eating plan that has been shown to reduce high blood pressure (hypertension). It may also reduce your risk for type 2 diabetes, heart disease, and stroke.  With the DASH eating plan, you should limit salt (sodium) intake to 2,300 mg a day. If you have hypertension, you may need to reduce your sodium intake to 1,500 mg a day.  When on the DASH eating plan, aim to eat more fresh fruits and vegetables, whole grains, lean proteins, low-fat dairy, and heart-healthy fats.  Work with your health care provider  or diet and nutrition specialist (dietitian) to adjust your eating plan to your individual calorie needs. This information is not intended to replace advice given to you by your health care provider. Make sure you discuss any questions you have with your health care provider. Document Released: 02/13/2011 Document Revised: 02/06/2017 Document Reviewed: 02/18/2016 Elsevier Patient Education  2020 [employer].

## 2019-01-18 NOTE — Telephone Encounter (Signed)
[facility] calling with patient's B/P readings elevated prior to any therapy. 147/99 and 141/99 along with a flushing sensation. Denied CP/SOB/Dizziness. Not on any antihypertensives-was hospitalized one month ago with elevated B/P. Had labs drawn this morning. Transferring for appointment due to symptomatic elevated high B/P reading. No fever/No contacts/No travels  Reason for Disposition . Systolic BP  >= 240 OR Diastolic >= 973  Answer Assessment - Initial Assessment Questions 1. BLOOD PRESSURE: "What is the blood pressure?" "Did you take at least two measurements 5 minutes apart?"     147/99 and 141/99 while at appointment for evaluation for PT 2. ONSET: "When did you take your blood pressure?"     This morning3. HOW: "How did you obtain the blood pressure?" (e.g., visiting nurse, automatic home BP monitor)     Physical Therapist, Amy 4. HISTORY: "Do you have a history of high blood pressure?"     no 5. MEDICATIONS: "Are you taking any medications for blood pressure?" "Have you missed any doses recently?"     na 6. OTHER SYMPTOMS: "Do you have any symptoms?" (e.g., headache, chest pain, blurred vision, difficulty breathing, weakness)     Flushing sensation same as one month ago when hospitalized for htn7. PREGNANCY: "Is there any chance you are pregnant?" "When was your last menstrual period?"     na  Protocols used: HIGH BLOOD PRESSURE-A-AH

## 2019-01-18 NOTE — Progress Notes (Signed)
Subjective:  Patient ID: Madeline Larsen, adult    DOB: 19-Dec-1985  Age: 33 y.o. MRN: [id number]  CC: Hypertension (report high BP reading from PT--pt report little headache and feel flush/ )  Hypertension This is a new problem. The current episode started in the past 7 days. The problem is uncontrolled. Associated symptoms include headaches. Pertinent negatives include no anxiety, blurred vision, chest pain, malaise/fatigue, neck pain, orthopnea, palpitations, peripheral edema, PND, shortness of breath or sweats. Risk factors for coronary artery disease include family history, obesity and sedentary lifestyle. Past treatments include nothing. There are no compliance problems.  There is no history of a hypertension causing med.  use of lodine since 12/16/2018, no change in dose. No new medication or change in dose or any OTC medication since last OV. Denies use of energy drinks or tobacco use or illicit drugs. Reports depression mood due to inability to return to work, denies any SI/HI, no ETOh use. US ABD done 12/28/18: normal CT renal 2018: normal adrenal gland.  Reviewed past Medical, Social and Family history today.  Outpatient Medications Prior to Visit  Medication Sig Dispense Refill   acetaminophen (TYLENOL) 500 MG tablet Take 1,000 mg by mouth every 6 (six) hours as needed for pain.     etodolac (LODINE) 400 MG tablet Take 1 tablet (400 mg total) by mouth 2 (two) times daily. 60 tablet 5   gabapentin (NEURONTIN) 100 MG capsule One po qAM; one po qPM and two po qHS 120 capsule 0   hydroxychloroquine (PLAQUENIL) 200 MG tablet TAKE 1 TABLET BY MOUTH TWICE A DAY (Patient taking differently: Take 200 mg by mouth 2 (two) times daily. ) 180 tablet 0   hyoscyamine (LEVSIN, ANASPAZ) 0.125 MG tablet Take 1 tablet (0.125 mg total) by mouth every 6 (six) hours as needed for cramping. 30 tablet 1   lactulose (CHRONULAC) 10 GM/15ML solution Take 30 mLs (20 g total) by mouth daily as needed  for severe constipation. 236 mL 0   lubiprostone (AMITIZA) 8 MCG capsule Take 1 capsule (8 mcg total) by mouth 2 (two) times daily with a meal. 60 capsule 5   ondansetron (ZOFRAN ODT) 8 MG disintegrating tablet Take 1 tablet (8 mg total) by mouth every 8 (eight) hours as needed for nausea or vomiting. 20 tablet 0   testosterone cypionate (DEPOTESTOSTERONE CYPIONATE) 200 MG/ML injection Inject 0.4 mLs (80 mg total) into the muscle once a week. 10 mL 0   ibuprofen (ADVIL,MOTRIN) 200 MG tablet Take 200 mg by mouth as needed for moderate pain.      No facility-administered medications prior to visit.     ROS See HPI  Objective:  BP (!) 144/94    Pulse 71    Temp 97.7 F (36.5 C) (Tympanic)    Ht 5\' 2"  (1.575 m)    Wt 249 lb 9.6 oz (113.2 kg)    SpO2 99%    BMI 45.65 kg/m   BP Readings from Last 3 Encounters:  01/18/19 (!) 144/94  01/18/19 (!) 151/112  01/06/19 127/79    Wt Readings from Last 3 Encounters:  01/18/19 249 lb 9.6 oz (113.2 kg)  01/06/19 252 lb (114.3 kg)  01/04/19 250 lb 12.8 oz (113.8 kg)   ECG: NSR, no ST segment or T wave abnormality compared to previous tracing.  Physical Exam Vitals signs reviewed.  Neck:     Musculoskeletal: Normal range of motion and neck supple.  Cardiovascular:     Rate and Rhythm:  Normal rate and regular rhythm.     Pulses: Normal pulses.     Heart sounds: Normal heart sounds.  Pulmonary:     Effort: Pulmonary effort is normal.     Breath sounds: Normal breath sounds.  Musculoskeletal:     Right lower leg: No edema.     Left lower leg: No edema.  Neurological:     Mental Status: He is alert and oriented to person, place, and time.     Gait: Gait abnormal.  Psychiatric:        Mood and Affect: Mood is depressed.        Speech: Speech normal.        Behavior: Behavior is cooperative.        Cognition and Memory: Cognition and memory normal.        Judgment: Judgment normal.     Lab Results  Component Value Date   WBC 3.6  (L) 12/28/2018   HGB 13.6 12/28/2018   HCT 43.8 12/28/2018   PLT 238 12/28/2018   GLUCOSE 96 12/28/2018   CHOL 125 01/26/2018   TRIG 76.0 01/26/2018   HDL 34.80 (L) 01/26/2018   LDLCALC 75 01/26/2018   ALT 15 12/28/2018   AST 23 12/28/2018   NA 138 12/28/2018   K 4.0 12/28/2018   CL 104 12/28/2018   CREATININE 0.88 12/28/2018   BUN 7 12/28/2018   CO2 24 12/28/2018   TSH 0.203 (L) 12/28/2018   INR 1.1 (H) 01/26/2018   HGBA1C 6.1 (H) 12/28/2018    Ct Head Wo Contrast  Result Date: 12/28/2018 CLINICAL DATA:  33 year old female with altered mental status. EXAM: CT HEAD WITHOUT CONTRAST TECHNIQUE: Contiguous axial images were obtained from the base of the skull through the vertex without intravenous contrast. COMPARISON:  Brain MRI dated 04/06/2014 FINDINGS: Brain: No evidence of acute infarction, hemorrhage, hydrocephalus, extra-axial collection or mass lesion/mass effect. Vascular: No hyperdense vessel or unexpected calcification. Skull: Normal. Negative for fracture or focal lesion. Sinuses/Orbits: No acute finding. Other: None IMPRESSION: Normal noncontrast CT of the brain. Electronically Signed   By: Elgie Collard M.D.   On: 12/28/2018 13:27   Mr Brain Wo Contrast  Result Date: 12/29/2018 CLINICAL DATA:  33 year old female with positive ANA arthritis. Sudden onset altered mental status. Hypertensive on presentation (180/80). Slurred speech, confusion. EXAM: MRI HEAD WITHOUT CONTRAST TECHNIQUE: Multiplanar, multiecho pulse sequences of the brain and surrounding structures were obtained without intravenous contrast. COMPARISON:  Head CT 12/28/2018. Brain MRI 04/06/2014. FINDINGS: Brain: Normal cerebral volume. No restricted diffusion to suggest acute infarction. No midline shift, mass effect, evidence of mass lesion, ventriculomegaly, extra-axial collection or acute intracranial hemorrhage. Cervicomedullary junction and pituitary are within normal limits. Wallace Cullens and white matter signal  is within normal limits throughout the brain. Incidental small right choroid plexus cyst (normal variant). No chronic cerebral blood products or encephalomalacia identified Vascular: Major intracranial vascular flow voids are stable since 2016. Skull and upper cervical spine: Negative visible cervical spine. Visualized bone marrow signal is within normal limits. Sinuses/Orbits: Negative orbits. Small right maxillary sinus mucous retention cyst is chronic. Other: Mastoids remain clear. Visible internal auditory structures appear normal. Scalp and face soft tissues appear negative. IMPRESSION: No acute intracranial abnormality. Stable since 2016 and normal noncontrast MRI appearance of the brain. Electronically Signed   By: Odessa Fleming M.D.   On: 12/29/2018 09:41   US Abdomen Complete  Result Date: 12/28/2018 CLINICAL DATA:  Diffuse, chronic abdominal pain. EXAM: ABDOMEN ULTRASOUND COMPLETE COMPARISON:  CT abdomen and pelvis 02/16/2017 abdominal ultrasound 08/31/2013. FINDINGS: Gallbladder: No gallstones or wall thickening visualized. No sonographic Murphy sign noted by sonographer. Common bile duct: Diameter: 0.4 cm Liver: No focal lesion identified. Within normal limits in parenchymal echogenicity. Portal vein is patent on color Doppler imaging with normal direction of blood flow towards the liver. IVC: No abnormality visualized. Pancreas: Visualized portion unremarkable. Spleen: Size and appearance within normal limits. Right Kidney: Length: 10.3 cm. Echogenicity within normal limits. No mass or hydronephrosis visualized. Left Kidney: Length: 10.1 cm. Echogenicity within normal limits. No mass or hydronephrosis visualized. Abdominal aorta: No aneurysm visualized. Other findings: None. IMPRESSION: Normal examination. Electronically Signed   By: Inge Rise M.D.   On: 12/28/2018 15:13    Assessment & Plan:   Shasta was seen today for hypertension.  Diagnoses and all orders for this visit:  Elevated BP  without diagnosis of hypertension -     HTN_2 EKG 12-Lead -     Cancel: HTN_1 BMP -     Cancel: CBC -     amLODipine (NORVASC) 5 MG tablet; Take 1 tablet (5 mg total) by mouth daily.  Adjustment disorder with depressed mood -     Ambulatory referral to Psychology   I have discontinued Alma "Madeline Larsen"'s ibuprofen. I am also having him start on amLODipine. Additionally, I am having him maintain his acetaminophen, hyoscyamine, testosterone cypionate, ondansetron, etodolac, hydroxychloroquine, gabapentin, lubiprostone, and lactulose.  Meds ordered this encounter  Medications   amLODipine (NORVASC) 5 MG tablet    Sig: Take 1 tablet (5 mg total) by mouth daily.    Dispense:  15 tablet    Refill:  1    Order Specific Question:   Supervising Provider    Answer:   Lucille Passy [[id number]]    Problem List Items Addressed This Visit    None    Visit Diagnoses    Elevated BP without diagnosis of hypertension    -  Primary   Relevant Medications   amLODipine (NORVASC) 5 MG tablet   Other Relevant Orders   HTN_2 EKG 12-Lead (Completed)   Adjustment disorder with depressed mood       Relevant Orders   Ambulatory referral to Psychology      Follow-up: Return in about 1 week (around 01/25/2019) for HTN (F2F).  Wilfred Lacy, NP

## 2019-01-19 ENCOUNTER — Encounter: Payer: Self-pay | Admitting: Nurse Practitioner

## 2019-01-20 ENCOUNTER — Other Ambulatory Visit: Payer: Self-pay | Admitting: Rheumatology

## 2019-01-20 DIAGNOSIS — R768 Other specified abnormal immunological findings in serum: Secondary | ICD-10-CM

## 2019-01-24 ENCOUNTER — Telehealth: Payer: Self-pay | Admitting: Nurse Practitioner

## 2019-01-24 NOTE — Telephone Encounter (Signed)

## 2019-01-25 ENCOUNTER — Encounter: Payer: Self-pay | Admitting: Nurse Practitioner

## 2019-01-25 ENCOUNTER — Other Ambulatory Visit: Payer: Self-pay

## 2019-01-25 ENCOUNTER — Other Ambulatory Visit: Payer: Self-pay | Admitting: Rheumatology

## 2019-01-25 ENCOUNTER — Telehealth (INDEPENDENT_AMBULATORY_CARE_PROVIDER_SITE_OTHER): Payer: No Typology Code available for payment source | Admitting: Nurse Practitioner

## 2019-01-25 ENCOUNTER — Other Ambulatory Visit: Payer: No Typology Code available for payment source

## 2019-01-25 VITALS — BP 153/97 | HR 73 | Temp 97.6°F | Ht 62.0 in | Wt 248.6 lb

## 2019-01-25 DIAGNOSIS — R768 Other specified abnormal immunological findings in serum: Secondary | ICD-10-CM

## 2019-01-25 DIAGNOSIS — I10 Essential (primary) hypertension: Secondary | ICD-10-CM | POA: Diagnosis not present

## 2019-01-25 MED ORDER — AMLODIPINE BESYLATE 5 MG PO TABS: 5.0000 mg | ORAL_TABLET | Freq: Every evening | ORAL | 2 refills | Status: DC

## 2019-01-25 MED ORDER — OLMESARTAN MEDOXOMIL 5 MG PO TABS: 10.0000 mg | ORAL_TABLET | ORAL | 2 refills | Status: DC

## 2019-01-25 NOTE — Assessment & Plan Note (Signed)
Onset 1week ago. No improvement with amlodipine 5mg . Reports persistent mild headache and flushed sensation. Denies any dizziness or chest pain or change in vision or palpitations Normal cbc and thyroid function BP Readings from Last 3 Encounters:  01/25/19 (!) 153/97  01/18/19 (!) 144/94  01/18/19 (!) 151/112   Wt Readings from Last 3 Encounters:  01/25/19 248 lb 9.6 oz (112.8 kg)  01/18/19 249 lb 9.6 oz (113.2 kg)  01/06/19 252 lb (114.3 kg)   Check BMP, catecholamine, metanephrine, and urine protein/creatinine ratio today Add olmesartan 10mg  daily. F/up in 1week. Consider referral to cardiology if no improvement.

## 2019-01-25 NOTE — Progress Notes (Signed)
Virtual Visit via Video Note  I connected with Madeline Larsen on 01/25/19 at 11:30 AM EST by a video enabled telemedicine application and verified that I am speaking with the correct person using two identifiers.  Location: Patient: Home Provider: Office  Only patient and provider present during video call. I discussed the limitations of evaluation and management by telemedicine and the availability of in person appointments. The patient expressed understanding and agreed to proceed.  CC: BP f/up  History of Present Illness: HTN: No improvement with amlodipine 5mg . Reports persistent mild headache and flushed sensation. Denies any dizziness or chest pain or change in vision or palpitations BP Readings from Last 3 Encounters:  01/25/19 (!) 153/97  01/18/19 (!) 144/94  01/18/19 (!) 151/112   Wt Readings from Last 3 Encounters:  01/25/19 248 lb 9.6 oz (112.8 kg)  01/18/19 249 lb 9.6 oz (113.2 kg)  01/06/19 252 lb (114.3 kg)   Reviewed social and medical history today.  Observations/Objective: Physical Exam  Constitutional: He is oriented to person, place, and time. No distress.  Cardiovascular: Normal rate.  Pulmonary/Chest: Effort normal.  Neurological: He is alert and oriented to person, place, and time.  Psychiatric: He has a normal mood and affect. His behavior is normal. Thought content normal.  Vitals reviewed.  Assessment and Plan: Madeline Larsen was seen today for follow-up.  Diagnoses and all orders for this visit:  Essential hypertension -     olmesartan (BENICAR) 5 MG tablet; Take 2 tablets (10 mg total) by mouth every morning. -     HTN_1 BMP -     Catecholamines, fractionated, plasma -     amLODipine (NORVASC) 5 MG tablet; Take 1 tablet (5 mg total) by mouth every evening. -     Metanephrines, plasma -     Protein / creatinine ratio, urine   Follow Up Instructions: See avs   I discussed the assessment and treatment plan with the patient. The patient was  provided an opportunity to ask questions and all were answered. The patient agreed with the plan and demonstrated an understanding of the instructions.   The patient was advised to call back or seek an in-person evaluation if the symptoms worsen or if the condition fails to improve as anticipated.  Wilfred Lacy, NP

## 2019-01-25 NOTE — Patient Instructions (Signed)
Go to lab for blood draw and urine collection. Continue to hold on PT sessions.  Continue amlodipine 5mg  in PM and start olmesartan 10mg  in AM (start today)  Maintain DASH and adequate oral hydration.  Monitor BP once a day in AM and record.  F/up in 1week (virtual appt)

## 2019-01-28 ENCOUNTER — Telehealth: Payer: Self-pay | Admitting: Rheumatology

## 2019-01-28 NOTE — Telephone Encounter (Signed)
Advised patient that a 90 day supply of PLQ was sent to the pharmacy on 12/28/2018. Patient states he did not receive the entire 90 day supply. I called patients pharmacy and they kept 2 refills on file when a 30 day supply was dispensed. Advised patient and he verbalized understanding and will get the prescription picked up today.

## 2019-01-28 NOTE — Telephone Encounter (Signed)
Patient called stating she sent 2 messages on Mychart for prescription refill of Plaquenil on 11/12 and 01/25/19.  Patient states she received a message back stating her renewal request has been denied.  Patient states she has been out of medication since last Thursday, 01/20/19 and is now having a headache.  Patient is requesting a return call.

## 2019-02-01 ENCOUNTER — Other Ambulatory Visit: Payer: Self-pay

## 2019-02-01 ENCOUNTER — Encounter: Payer: Self-pay | Admitting: Nurse Practitioner

## 2019-02-01 ENCOUNTER — Ambulatory Visit (INDEPENDENT_AMBULATORY_CARE_PROVIDER_SITE_OTHER): Payer: No Typology Code available for payment source | Admitting: Nurse Practitioner

## 2019-02-01 VITALS — BP 145/96 | HR 76 | Temp 98.7°F | Ht 62.0 in | Wt 249.1 lb

## 2019-02-01 DIAGNOSIS — R1013 Epigastric pain: Secondary | ICD-10-CM

## 2019-02-01 DIAGNOSIS — K5904 Chronic idiopathic constipation: Secondary | ICD-10-CM

## 2019-02-01 DIAGNOSIS — I10 Essential (primary) hypertension: Secondary | ICD-10-CM

## 2019-02-01 MED ORDER — AMLODIPINE BESYLATE 10 MG PO TABS: 10.0000 mg | ORAL_TABLET | Freq: Every day | ORAL | 5 refills | Status: DC

## 2019-02-01 NOTE — Progress Notes (Signed)
Virtual Visit via Video Note  I connected with Madeline Larsen on 02/01/19 at 11:30 AM EST by a video enabled telemedicine application and verified that I am speaking with the correct person using two identifiers.  Location: Patient: private vehicle Provider: office Only provider and patient present during video call I discussed the limitations of evaluation and management by telemedicine and the availability of in person appointments. The patient expressed understanding and agreed to proceed.  CC: follow up on BP--pt report BP last 3 days was 11/23-119/78, 11/22--141/97, 11/21--113/74 (wrist cuff)  History of Present Illness: HTN:  Waxing and waning BP readings. reports compliance with BP medications: benicar in AM and amlodipine at HS. Resolved headache, no dizziness, no chest pain, no cough, no LE edema   Dyspepsia: Persistent ABD cramps, heartburn, bloating and belching, ongoing for over 1year. Symptoms are not related to food intake Improved constipation with use of amitiza BID.  Observations/Objective: Physical Exam  Constitutional: He is oriented to person, place, and time.  Cardiovascular: Normal rate.  Pulmonary/Chest: Effort normal.  Neurological: He is alert and oriented to person, place, and time.  Vitals reviewed.  Assessment and Plan: Hennie was seen today for follow-up.  Diagnoses and all orders for this visit:  Essential hypertension -     amLODipine (NORVASC) 10 MG tablet; Take 1 tablet (10 mg total) by mouth at bedtime. -     Ambulatory referral to Cardiology  Chronic idiopathic constipation -     Ambulatory referral to Gastroenterology  Dyspepsia -     Ambulatory referral to Gastroenterology   Follow Up Instructions: See avs   I discussed the assessment and treatment plan with the patient. The patient was provided an opportunity to ask questions and all were answered. The patient agreed with the plan and demonstrated an understanding of the  instructions.   The patient was advised to call back or seek an in-person evaluation if the symptoms worsen or if the condition fails to improve as anticipated.  Wilfred Lacy, NP

## 2019-02-01 NOTE — Assessment & Plan Note (Addendum)
pt reports BP last 3 days was 11/23-119/78, 11/22--141/97, 11/21--113/74 (wrist cuff) BP Readings from Last 3 Encounters:  02/01/19 (!) 145/96  01/25/19 (!) 153/97  01/18/19 (!) 144/94  reports compliance with BP medications: benicar in AM and amlodipine at HS. Resolved headache, no dizziness, no chest pain, no cough, no LE edema. Normal ECG. Normal BMP Normal urine protein/creatinine and metanephrine, pending catecholamine.  Maintain Benicar 10mg  Increase amlodipine to 10mg  at hs. Entered referral to cardiology F/up in 2weeks

## 2019-02-01 NOTE — Patient Instructions (Signed)
Increase amlodipine to 10mg  at HS You will be contacted to schedule appt with cardiology. Maintain DASH diet and adequate oral hydration.

## 2019-02-04 ENCOUNTER — Encounter: Payer: Self-pay | Admitting: Nurse Practitioner

## 2019-02-05 ENCOUNTER — Ambulatory Visit
Admission: RE | Admit: 2019-02-05 | Discharge: 2019-02-05 | Disposition: A | Discharge status: Home / Self Care | Payer: No Typology Code available for payment source | Source: Ambulatory Visit | Attending: Neurology | Admitting: Neurology

## 2019-02-05 ENCOUNTER — Other Ambulatory Visit: Payer: Self-pay

## 2019-02-05 DIAGNOSIS — R269 Unspecified abnormalities of gait and mobility: Secondary | ICD-10-CM

## 2019-02-05 DIAGNOSIS — R200 Anesthesia of skin: Secondary | ICD-10-CM

## 2019-02-06 ENCOUNTER — Other Ambulatory Visit: Payer: Self-pay | Admitting: Family Medicine

## 2019-02-06 LAB — PROTEIN / CREATININE RATIO, URINE
Creatinine, Urine: 183 mg/dL (ref 20–275)
Protein/Creat Ratio: 49 mg/g creat (ref 21–161)
Protein/Creatinine Ratio: 0.049 mg/mg creat (ref 0.021–0.16)
Total Protein, Urine: 9 mg/dL (ref 5–24)

## 2019-02-06 LAB — BASIC METABOLIC PANEL
BUN: 9 mg/dL (ref 7–25)
CO2: 26 mmol/L (ref 20–32)
Calcium: 9.2 mg/dL (ref 8.6–10.2)
Chloride: 104 mmol/L (ref 98–110)
Creat: 0.82 mg/dL (ref 0.50–1.10)
Glucose, Bld: 143 mg/dL — ABNORMAL HIGH (ref 65–99)
Potassium: 3.8 mmol/L (ref 3.5–5.3)
Sodium: 139 mmol/L (ref 135–146)

## 2019-02-06 LAB — CATECHOLAMINES, FRACTIONATED, PLASMA
Dopamine: 17 pg/mL
Epinephrine: <20 pg/mL
Norepinephrine: 384 pg/mL
Total Catecholamines: 401 pg/mL

## 2019-02-06 LAB — METANEPHRINES, PLASMA
Metanephrine, Free: 28 pg/mL (ref <=57)
Normetanephrine, Free: 63 pg/mL (ref <=148)
Total Metanephrines-Plasma: 91 pg/mL (ref <=205)

## 2019-02-07 ENCOUNTER — Ambulatory Visit (INDEPENDENT_AMBULATORY_CARE_PROVIDER_SITE_OTHER): Payer: No Typology Code available for payment source | Admitting: Gastroenterology

## 2019-02-07 ENCOUNTER — Telehealth: Payer: Self-pay | Admitting: Neurology

## 2019-02-07 ENCOUNTER — Telehealth: Payer: Self-pay | Admitting: *Deleted

## 2019-02-07 ENCOUNTER — Other Ambulatory Visit: Payer: Self-pay

## 2019-02-07 ENCOUNTER — Encounter: Payer: Self-pay | Admitting: Gastroenterology

## 2019-02-07 VITALS — BP 132/80 | HR 79 | Temp 96.0°F | Ht 62.0 in | Wt 249.2 lb

## 2019-02-07 DIAGNOSIS — K5900 Constipation, unspecified: Secondary | ICD-10-CM | POA: Diagnosis not present

## 2019-02-07 DIAGNOSIS — R1013 Epigastric pain: Secondary | ICD-10-CM | POA: Diagnosis not present

## 2019-02-07 DIAGNOSIS — K5904 Chronic idiopathic constipation: Secondary | ICD-10-CM | POA: Diagnosis not present

## 2019-02-07 MED ORDER — PANTOPRAZOLE SODIUM 40 MG PO TBEC: 40.0000 mg | DELAYED_RELEASE_TABLET | Freq: Every day | ORAL | 2 refills | Status: DC

## 2019-02-07 MED ORDER — GABAPENTIN 100 MG PO CAPS: ORAL_CAPSULE | ORAL | 0 refills | Status: DC

## 2019-02-07 MED ORDER — ONDANSETRON 8 MG PO TBDP: 8.0000 mg | ORAL_TABLET | Freq: Three times a day (TID) | ORAL | 0 refills | Status: DC | PRN

## 2019-02-07 MED ORDER — LUBIPROSTONE 8 MCG PO CAPS: 24.0000 ug | ORAL_CAPSULE | Freq: Every day | ORAL | 5 refills | Status: DC

## 2019-02-07 MED ORDER — FDGARD 25-20.75 MG PO CAPS: 2.0000 | ORAL_CAPSULE | Freq: Two times a day (BID) | ORAL | 0 refills | Status: DC

## 2019-02-07 NOTE — Patient Instructions (Addendum)
If you are age 35 or older, your body mass index should be between 23-30. Your Body mass index is 45.58 kg/m. If this is out of the aforementioned range listed, please consider follow up with your Primary Care Provider.  If you are age 52 or younger, your body mass index should be between 19-25. Your Body mass index is 45.58 kg/m. If this is out of the aformentioned range listed, please consider follow up with your Primary Care Provider.   Increase Your Amitiza to 24 mcg daily  We have sent the following medications to your pharmacy for you to pick up at your convenience:: Pantoprazole 40 mg daily for 8 weeks  We are giving you some FDgard (samples) to try. You can buy them over the counter if they are helpful for you.  Return to clinic in 4-6 weeks, earlier as needed. We will send you a reminder when it is time to schedule an appointment.   Thank you for entrusting me with your care and for choosing [facility], Dr. [place]

## 2019-02-07 NOTE — Telephone Encounter (Signed)
Please contact Madeline Larsen and advise to go to ED for further evaluation of chest pain and SOB. Thank you

## 2019-02-07 NOTE — Progress Notes (Signed)
Referring Provider: Anne Ng, NP Primary Care Physician:  Anne Ng, NP  Reason for Consultation:  Dyspepsia, constipation   IMPRESSION:  Dyspepsia x a couple of years    - normal abdominal ultrasound 12/2018    - normal liver enzymes and lipase Constipation with sense of incomplete evacuation    - uses lactulose for success    - recent trial of low dose Amitiza provided some relief Rare ibuprofen use for arthralgias  Dyspepsia without alarm features.  As her dyspepsia may be due to NSAID-related peptic ulcer disease, gastroesophageal reflux, or medications (nonsteroidal anti-inflammatory agents being the most common offender), will start with a trial of pantoprazole 40 mg daily and FD guard.  We will proceed with endoscopic evaluation as well as assessment for other etiology such as biliary pain, gastroparesis,  carbohydrate malabsorption, celiac, bacterial overgrowth if symptoms persist despite empiric treatment. Malignancy is less likely.  Differential also includes functional dyspepsia.   Her constipation may be worsening her dyspepsia.  Will increase Amitiza to 24 mcg daily.   PLAN: Increase Amitiza to 24 mcg daily, may use twice daily if once daily is not providing enough relief Pantoprazole 40 mg daily x 8 weeks FD guard (samples) Avoid all NSAIDs Return to clinic in 4-6 weeks, earlier as needed  Please see the "Patient Instructions" section for addition details about the plan.  HPI: Madeline Larsen is a 33 y.o. adult referred by Dr. Elease Etienne for dyspepsia and constipation.  The history is obtained to the patient and review of her electronic health record.  She has hypertension.  Nausea, vomiting, bloating, eructation, early satiety x a couple of years Thick saliva that seems to cause some coughing or choking Symptoms not related to eating, not relieved with defecation Trial of Nexium and Prevacid for GERD provided no relief.  Having a BM every 2 days  but will only go with use of Lactulose.  Amitiza daily for constipation x 1 month. More frequent bowel movement but still uses lactulose for successful resolution of persistent incomplete evacuation. Frequent straining.  No blood or mucous in the stool.   Weight fluctuates. Ibuprofen rarely for muscle pains and knee pain, but none in over 2 months. Gabapentin has improved the pain enough that he isn't using ibuprofen further.  No other associated symptoms. No identified exacerbating or relieving features.   Normal abdominal ultraound 12/28/18.  Labs from 12/28/18 showed normal liver enzymes, lipase of 31, normal CBC except for WBC of 3.6  Uncle with pancreas cancer diagnosed last month at age 13. Multiple family members with similar symptoms. No known family history of colon cancer or polyps. No other family history of uterine/endometrial cancer, pancreatic cancer or gastric/stomach cancer.   Past Medical History:  Diagnosis Date   Depression    Female-to-female transgender person    GERD (gastroesophageal reflux disease)    MRSA (methicillin resistant staph aureus) culture positive     Past Surgical History:  Procedure Laterality Date   FOOT SURGERY Left    top surgery  02/2018    Current Outpatient Medications  Medication Sig Dispense Refill   acetaminophen (TYLENOL) 500 MG tablet Take 1,000 mg by mouth every 6 (six) hours as needed for pain.     amLODipine (NORVASC) 10 MG tablet Take 1 tablet (10 mg total) by mouth at bedtime. 30 tablet 5   etodolac (LODINE) 400 MG tablet Take 1 tablet (400 mg total) by mouth 2 (two) times daily. 60 tablet 5  gabapentin (NEURONTIN) 100 MG capsule One po qAM; one po qPM and two po qHS 120 capsule 0   hydroxychloroquine (PLAQUENIL) 200 MG tablet TAKE 1 TABLET BY MOUTH TWICE A DAY (Patient taking differently: Take 200 mg by mouth 2 (two) times daily. ) 180 tablet 0   lubiprostone (AMITIZA) 8 MCG capsule Take 1 capsule (8 mcg total)  by mouth 2 (two) times daily with a meal. 60 capsule 5   olmesartan (BENICAR) 5 MG tablet Take 2 tablets (10 mg total) by mouth every morning. 60 tablet 2   ondansetron (ZOFRAN ODT) 8 MG disintegrating tablet Take 1 tablet (8 mg total) by mouth every 8 (eight) hours as needed for nausea or vomiting. 30 tablet 0   testosterone cypionate (DEPOTESTOSTERONE CYPIONATE) 200 MG/ML injection Inject 0.4 mLs (80 mg total) into the muscle once a week. 10 mL 0   No current facility-administered medications for this visit.     Allergies as of 02/07/2019   (No Known Allergies)    Family History  Problem Relation Age of Onset   Alcohol abuse Mother    Cancer Mother 3538       Uterine cancer   Drug abuse Mother    HIV/AIDS Mother    Early death Mother 6638       uterine cancer   Alcohol abuse Father    Drug abuse Father    Polymyositis Father 3435       deceased at age 35   Early death Father 5040       polymyositis   Alcohol abuse Sister    Depression Sister    Drug abuse Sister    Depression Brother    Kidney disease Maternal Grandmother        ESRD on dialysis   Hypertension Maternal Grandmother    Rheum arthritis Paternal Grandmother    Rheum arthritis Brother    Depression Sister    Depression Sister    Depression Sister    Lupus Cousin    Lupus Cousin    Lupus Cousin    Prostate cancer Paternal Uncle     Social History   Socioeconomic History   Marital status: Married    Spouse name: Not on file   Number of children: 2   Years of education: Not on file   Highest education level: High school graduate  Occupational History   Not on file  Social Needs   Financial resource strain: Not on file   Food insecurity    Worry: Not on file    Inability: Not on file   Transportation needs    Medical: Not on file    Non-medical: Not on file  Tobacco Use   Smoking status: Never Smoker   Smokeless tobacco: Never Used  Substance and Sexual Activity    Alcohol use: Yes    Alcohol/week: 0.0 standard drinks    Comment: socially   Drug use: Not Currently    Types: Marijuana   Sexual activity: Yes    Birth control/protection: None    Comment: female partner  Lifestyle   Physical activity    Days per week: Not on file    Minutes per session: Not on file   Stress: Not on file  Relationships   Social connections    Talks on phone: Not on file    Gets together: Not on file    Attends religious service: Not on file    Active member of club or organization: Not on file  Attends meetings of clubs or organizations: Not on file    Relationship status: Not on file   Intimate partner violence    Fear of current or ex partner: Not on file    Emotionally abused: Not on file    Physically abused: Not on file    Forced sexual activity: Not on file  Other Topics Concern   Not on file  Social History Narrative   Lives at home with wife & kids   Right handed    Review of Systems: 12 system ROS is negative except as noted above.   Physical Exam: General:   Alert,  well-nourished, pleasant and cooperative in NAD Head:  Normocephalic and atraumatic. Eyes:  Sclera clear, no icterus.   Conjunctiva pink. Ears:  Normal auditory acuity. Nose:  No deformity, discharge,  or lesions. Mouth:  No deformity or lesions.   Neck:  Supple; no masses or thyromegaly. Lungs:  Clear throughout to auscultation.   No wheezes. Heart:  Regular rate and rhythm; no murmurs. Abdomen:  Soft,nontender, nondistended, normal bowel sounds, no rebound or guarding. No hepatosplenomegaly.   Rectal:  Deferred  Msk:  Symmetrical. No boney deformities LAD: No inguinal or umbilical LAD Extremities:  No clubbing or edema. Neurologic:  Alert and  oriented x4;  grossly nonfocal Skin:  Intact without significant lesions or rashes. Psych:  Alert and cooperative. Normal mood and affect.    Ula Couvillon L. Tarri Glenn, MD, MPH 02/07/2019, 2:19 PM

## 2019-02-07 NOTE — Telephone Encounter (Signed)
Ok to sent zofran 4mg  every 8hrs prn, #30, no refill Gabapentin refill should come from neurology.

## 2019-02-07 NOTE — Telephone Encounter (Signed)
Rx sent # 30 no refill for Zofran.   Pt aware to contact his neurology for refill on gabapentin.

## 2019-02-07 NOTE — Telephone Encounter (Signed)
Pt request refill of this med--still need this for nausea at times--Please advise  Pt also request refill for gabapentin--pt is taking 4 daily--prescribed from the hospital for neuropathy. Please advise

## 2019-02-07 NOTE — Telephone Encounter (Signed)
E-scribed refill 

## 2019-02-07 NOTE — Telephone Encounter (Signed)
-----   Message from Britt Bottom, MD sent at 02/07/2019  1:47 PM EST ----- Please let the patient know that the MRI of the cervical spine looked okay.  The spinal cord was normal.  There was just a couple small disc bulges that did not cause any nerve root compression.

## 2019-02-07 NOTE — Telephone Encounter (Signed)
Pt is requesting a refill of gabapentin (NEURONTIN) 100 MG capsule , to be sent to [id number] - [place], [place]

## 2019-02-08 NOTE — Progress Notes (Signed)
Cardiology Office Note:    Date:  02/10/2019   ID:  Madeline Larsen, DOB [birth year], MRN [id number]  PCP:  Flossie Buffy, NP  Cardiologist:  No primary care provider on file.  Electrophysiologist:  None   Referring MD: Flossie Buffy, NP   Chief Complaint  Patient presents with  . Hypertension    History of Present Illness:    Madeline Larsen is a 33 y.o. adult with a hx of hypertension who is referred by Wilfred Lacy, NP for evaluation of hypertension.  Diagnosed with HTN recently, started on benicar and amlodpine.  Currently on Benicar 10 mg and amlodipine 10 mg daily.  Reports BP has been well controlled since titrating up to current doses.  Has been 110-130s/ 50-80s.  Has not been exercising due to leg pain.  Started on gabapentin and that has been helping.  Planning to start physical therapy but was not started due to elevated BP.   Reports at night will get tightness in chest.  Describes as tightness in center of chest, lasts for 1-2 hours.  Does not occur with exertion.  He reports that he wakes up frequently throughout the night and is tired throughout the day.  He used to smoke marijuana but quit in October.  Does not smoke cigarettes.  His father died of polymyositis at age 48; he thinks he had heart issues as well but is unsure of the details.   Past Medical History:  Diagnosis Date  . Depression   . Female-to-female transgender person   . GERD (gastroesophageal reflux disease)   . MRSA (methicillin resistant staph aureus) culture positive     Past Surgical History:  Procedure Laterality Date  . FOOT SURGERY Left   . top surgery  02/2018    Current Medications: Current Meds  Medication Sig  . amLODipine (NORVASC) 10 MG tablet Take 1 tablet (10 mg total) by mouth at bedtime.  Gladis Riffle Oil-Levomenthol (FDGARD) 25-20.75 MG CAPS Take 2 capsules by mouth 2 (two) times daily. Take as directed, daily  . etodolac (LODINE) 400 MG tablet Take 1 tablet (400 mg  total) by mouth 2 (two) times daily.  Marland Kitchen gabapentin (NEURONTIN) 100 MG capsule One po qAM; one po qPM and two po qHS  . lubiprostone (AMITIZA) 8 MCG capsule Take 3 capsules (24 mcg total) by mouth daily.  Marland Kitchen olmesartan (BENICAR) 5 MG tablet Take 2 tablets (10 mg total) by mouth every morning.  . ondansetron (ZOFRAN ODT) 8 MG disintegrating tablet Take 1 tablet (8 mg total) by mouth every 8 (eight) hours as needed for nausea or vomiting.  . pantoprazole (PROTONIX) 40 MG tablet Take 1 tablet (40 mg total) by mouth daily. For 8 weeks  . testosterone cypionate (DEPOTESTOSTERONE CYPIONATE) 200 MG/ML injection Inject 0.4 mLs (80 mg total) into the muscle once a week.     Allergies:   Patient has no known allergies.   Social History   Socioeconomic History  . Marital status: Married    Spouse name: Not on file  . Number of children: 2  . Years of education: Not on file  . Highest education level: High school graduate  Occupational History  . Not on file  Social Needs  . Financial resource strain: Not on file  . Food insecurity    Worry: Not on file    Inability: Not on file  . Transportation needs    Medical: Not on file    Non-medical: Not on file  Tobacco Use  . Smoking status: Never Smoker  . Smokeless tobacco: Never Used  Substance and Sexual Activity  . Alcohol use: Yes    Alcohol/week: 0.0 standard drinks    Comment: socially  . Drug use: Not Currently    Types: Marijuana  . Sexual activity: Yes    Birth control/protection: None    Comment: female partner  Lifestyle  . Physical activity    Days per week: Not on file    Minutes per session: Not on file  . Stress: Not on file  Relationships  . Social Musicianconnections    Talks on phone: Not on file    Gets together: Not on file    Attends religious service: Not on file    Active member of club or organization: Not on file    Attends meetings of clubs or organizations: Not on file    Relationship status: Not on file  Other  Topics Concern  . Not on file  Social History Narrative   Lives at home with wife & kids   Right handed     Family History: The patient's family history includes Alcohol abuse in his father, mother, and sister; Cancer (age of onset: 8438) in his mother; Depression in his brother, sister, sister, sister, and sister; Drug abuse in his father, mother, and sister; Early death (age of onset: 2038) in his mother; Early death (age of onset: 3540) in his father; HIV/AIDS in his mother; Hypertension in his maternal grandmother; Kidney disease in his maternal grandmother; Lupus in his cousin, cousin, and cousin; Polymyositis (age of onset: 4635) in his father; Prostate cancer in his paternal uncle; Rheum arthritis in his brother and paternal grandmother.  ROS:   Please see the history of present illness.     All other systems reviewed and are negative.  EKGs/Labs/Other Studies Reviewed:    The following studies were reviewed today:   EKG:  EKG is  ordered today.  The ekg ordered today demonstrates normal sinus rhythm, rate 62, no ST/T abnormalities  Recent Labs: 12/28/2018: ALT 15; Hemoglobin 13.6; Magnesium 2.0; Platelets 238 01/18/2019: TSH 0.51 01/25/2019: BUN 9; Creat 0.82; Potassium 3.8; Sodium 139  Recent Lipid Panel    Component Value Date/Time   CHOL 125 01/26/2018 0901   TRIG 76.0 01/26/2018 0901   HDL 34.80 (L) 01/26/2018 0901   CHOLHDL 4 01/26/2018 0901   VLDL 15.2 01/26/2018 0901   LDLCALC 75 01/26/2018 0901    Physical Exam:    VS:  BP 126/82   Pulse 64   Ht 5\' 2"  (1.575 m)   Wt 247 lb 12.8 oz (112.4 kg)   SpO2 99%   BMI 45.32 kg/m     Wt Readings from Last 3 Encounters:  02/10/19 247 lb 12.8 oz (112.4 kg)  02/07/19 249 lb 3.2 oz (113 kg)  02/01/19 249 lb 1.6 oz (113 kg)     GEN:   in no acute distress HEENT: Normal NECK: No JVD; No carotid bruits LYMPHATICS: No lymphadenopathy CARDIAC: RRR, no murmurs, rubs, gallops RESPIRATORY:  Clear to auscultation without  rales, wheezing or rhonchi  ABDOMEN: Soft, non-tender, non-distended MUSCULOSKELETAL:  No edema; No deformity  SKIN: Warm and dry NEUROLOGIC:  Alert and oriented x 3 PSYCHIATRIC:  Normal affect   ASSESSMENT:    1. Hypertension, unspecified type   2. Chest pain, unspecified type   3. Medication management   4. Snoring    PLAN:    In order of problems listed above:  Hypertension: Appears well controlled on amlodipine 10 mg daily and Benicar 10 mg daily.  Given age, warrants work-up for secondary causes.    - Reports symptoms concerning for OSA, will check sleep study - CMET, TSH, serum metanephrines unremarkable.  Will check renin/aldosterone - TTE   Chest pain: Suspect noncardiac etiology, as describes nonexertional pain that occurs when she lies down at night.  RTC in 3 months   Medication Adjustments/Labs and Tests Ordered: Current medicines are reviewed at length with the patient today.  Concerns regarding medicines are outlined above.  Orders Placed This Encounter  Procedures  . Aldosterone + renin activity w/o ratio  . EKG 12-Lead  . ECHOCARDIOGRAM COMPLETE  . Split night study   No orders of the defined types were placed in this encounter.   Patient Instructions  Medication Instructions:  Your Physician recommend you continue on your current medication as directed.    *If you need a refill on your cardiac medications before your next appointment, please call your pharmacy*  Lab Work: Your physician recommends that you return for lab work today.  If you have labs (blood work) drawn today and your tests are completely normal, you will receive your results only by: Marland Kitchen MyChart Message (if you have MyChart) OR . A paper copy in the mail If you have any lab test that is abnormal or we need to change your treatment, we will call you to review the results.  Testing/Procedures: Your physician has requested that you have an echocardiogram. Echocardiography is a  painless test that uses sound waves to create images of your heart. It provides your doctor with information about the size and shape of your heart and how well your heart's chambers and valves are working. This procedure takes approximately one hour. There are no restrictions for this procedure. [address]  Your physician has recommended that you have a sleep study. This test records several body functions during sleep, including: brain activity, eye movement, oxygen and carbon dioxide blood levels, heart rate and rhythm, breathing rate and rhythm, the flow of air through your mouth and nose, snoring, body muscle movements, and chest and belly movement. [facility]   Follow-Up: At [facility], you and your health needs are our priority.  As part of our continuing mission to provide you with exceptional heart care, we have created designated Provider Care Teams.  These Care Teams include your primary Cardiologist (physician) and Advanced Practice Providers (APPs -  Physician Assistants and Nurse Practitioners) who all work together to provide you with the care you need, when you need it.  Your next appointment:   3 month(s)  The format for your next appointment:   In Person  Provider:   Epifanio Lesches, MD      Signed, Little Ishikawa, MD  02/10/2019 11:55 PM    [facility]

## 2019-02-10 ENCOUNTER — Other Ambulatory Visit: Payer: Self-pay

## 2019-02-10 ENCOUNTER — Ambulatory Visit (INDEPENDENT_AMBULATORY_CARE_PROVIDER_SITE_OTHER): Payer: No Typology Code available for payment source | Admitting: Cardiology

## 2019-02-10 VITALS — BP 126/82 | HR 64 | Ht 62.0 in | Wt 247.8 lb

## 2019-02-10 DIAGNOSIS — Z79899 Other long term (current) drug therapy: Secondary | ICD-10-CM

## 2019-02-10 DIAGNOSIS — I10 Essential (primary) hypertension: Secondary | ICD-10-CM | POA: Diagnosis not present

## 2019-02-10 DIAGNOSIS — R0683 Snoring: Secondary | ICD-10-CM | POA: Diagnosis not present

## 2019-02-10 DIAGNOSIS — R079 Chest pain, unspecified: Secondary | ICD-10-CM | POA: Diagnosis not present

## 2019-02-10 NOTE — Patient Instructions (Signed)
Medication Instructions:  Your Physician recommend you continue on your current medication as directed.    *If you need a refill on your cardiac medications before your next appointment, please call your pharmacy*  Lab Work: Your physician recommends that you return for lab work today.  If you have labs (blood work) drawn today and your tests are completely normal, you will receive your results only by: Marland Kitchen MyChart Message (if you have MyChart) OR . A paper copy in the mail If you have any lab test that is abnormal or we need to change your treatment, we will call you to review the results.  Testing/Procedures: Your physician has requested that you have an echocardiogram. Echocardiography is a painless test that uses sound waves to create images of your heart. It provides your doctor with information about the size and shape of your heart and how well your heart's chambers and valves are working. This procedure takes approximately one hour. There are no restrictions for this procedure. [place] has recommended that you have a sleep study. This test records several body functions during sleep, including: brain activity, eye movement, oxygen and carbon dioxide blood levels, heart rate and rhythm, breathing rate and rhythm, the flow of air through your mouth and nose, snoring, body muscle movements, and chest and belly movement. [facility]   Follow-Up: At [facility], you and your health needs are our priority.  As part of our continuing mission to provide you with exceptional heart care, we have created designated Provider Care Teams.  These Care Teams include your primary Cardiologist (physician) and Advanced Practice Providers (APPs -  Physician Assistants and Nurse Practitioners) who all work together to provide you with the care you need, when you need it.  Your next appointment:   3 month(s)  The format for your next appointment:   In  Person  Provider:   Oswaldo Milian, MD

## 2019-02-15 ENCOUNTER — Encounter: Payer: Self-pay | Admitting: Nurse Practitioner

## 2019-02-16 ENCOUNTER — Other Ambulatory Visit: Payer: Self-pay | Admitting: Nurse Practitioner

## 2019-02-16 ENCOUNTER — Ambulatory Visit: Payer: No Typology Code available for payment source | Admitting: Nurse Practitioner

## 2019-02-16 DIAGNOSIS — I10 Essential (primary) hypertension: Secondary | ICD-10-CM

## 2019-02-17 ENCOUNTER — Telehealth: Payer: Self-pay | Admitting: *Deleted

## 2019-02-17 LAB — ALDOSTERONE + RENIN ACTIVITY W/O RATIO: ALDOSTERONE: 2.2 ng/dL (ref 0.0–30.0)

## 2019-02-17 NOTE — Telephone Encounter (Signed)
Patient notified of COVID and sleep study appointment details. He voiced understanding of quarantine instructions.

## 2019-02-18 ENCOUNTER — Ambulatory Visit (INDEPENDENT_AMBULATORY_CARE_PROVIDER_SITE_OTHER): Payer: No Typology Code available for payment source | Admitting: Psychology

## 2019-02-18 DIAGNOSIS — F320 Major depressive disorder, single episode, mild: Secondary | ICD-10-CM

## 2019-02-24 ENCOUNTER — Ambulatory Visit (HOSPITAL_COMMUNITY): Payer: PRIVATE HEALTH INSURANCE | Attending: Cardiology

## 2019-02-24 ENCOUNTER — Other Ambulatory Visit: Payer: Self-pay

## 2019-02-24 ENCOUNTER — Ambulatory Visit (INDEPENDENT_AMBULATORY_CARE_PROVIDER_SITE_OTHER): Payer: No Typology Code available for payment source | Admitting: Psychology

## 2019-02-24 DIAGNOSIS — F320 Major depressive disorder, single episode, mild: Secondary | ICD-10-CM | POA: Diagnosis not present

## 2019-02-24 DIAGNOSIS — R079 Chest pain, unspecified: Secondary | ICD-10-CM

## 2019-02-24 DIAGNOSIS — I10 Essential (primary) hypertension: Secondary | ICD-10-CM

## 2019-02-25 ENCOUNTER — Other Ambulatory Visit: Payer: Self-pay | Admitting: Endocrinology

## 2019-02-25 NOTE — Telephone Encounter (Signed)
Please advise 

## 2019-02-25 NOTE — Telephone Encounter (Signed)
Not due yet

## 2019-02-26 ENCOUNTER — Other Ambulatory Visit (HOSPITAL_COMMUNITY): Payer: No Typology Code available for payment source

## 2019-02-28 ENCOUNTER — Other Ambulatory Visit (HOSPITAL_COMMUNITY)
Admission: RE | Admit: 2019-02-28 | Discharge: 2019-02-28 | Disposition: A | Discharge status: Home / Self Care | Payer: No Typology Code available for payment source | Source: Ambulatory Visit | Attending: Cardiovascular Disease | Admitting: Cardiovascular Disease

## 2019-02-28 DIAGNOSIS — Z20828 Contact with and (suspected) exposure to other viral communicable diseases: Secondary | ICD-10-CM | POA: Diagnosis present

## 2019-02-28 LAB — SARS CORONAVIRUS 2 (TAT 6-24 HRS): SARS Coronavirus 2: NEGATIVE

## 2019-03-01 ENCOUNTER — Other Ambulatory Visit: Payer: Self-pay

## 2019-03-01 ENCOUNTER — Ambulatory Visit (HOSPITAL_BASED_OUTPATIENT_CLINIC_OR_DEPARTMENT_OTHER)
Payer: No Typology Code available for payment source | Attending: Cardiovascular Disease | Admitting: Cardiovascular Disease

## 2019-03-01 DIAGNOSIS — R0683 Snoring: Secondary | ICD-10-CM | POA: Diagnosis present

## 2019-03-02 ENCOUNTER — Other Ambulatory Visit (HOSPITAL_BASED_OUTPATIENT_CLINIC_OR_DEPARTMENT_OTHER): Payer: Self-pay

## 2019-03-02 DIAGNOSIS — R0683 Snoring: Secondary | ICD-10-CM

## 2019-03-05 ENCOUNTER — Encounter (HOSPITAL_BASED_OUTPATIENT_CLINIC_OR_DEPARTMENT_OTHER): Payer: Self-pay | Admitting: Cardiovascular Disease

## 2019-03-05 NOTE — Procedures (Signed)
    Patient Name: Madeline Larsen, Madeline Larsen Date: 03/01/2019 Gender: Female D.O.B: 06-23-1985 Age (years): 32 Referring Provider: Oswaldo Milian Height (inches): 63 Interpreting Physician: Shelva Majestic MD, ABSM Weight (lbs): 247 RPSGT: Laren Everts BMI: 44 MRN: [id number] Neck Size: 16.00  CLINICAL INFORMATION Sleep Study Type: NPSG  Indication for sleep study: Fatigue, Hypertension, Morning Headaches, Obesity  Epworth Sleepiness Score: 7  SLEEP STUDY TECHNIQUE As per the AASM Manual for the Scoring of Sleep and Associated Events v2.3 (April 2016) with a hypopnea requiring 4% desaturations.  The channels recorded and monitored were frontal, central and occipital EEG, electrooculogram (EOG), submentalis EMG (chin), nasal and oral airflow, thoracic and abdominal wall motion, anterior tibialis EMG, snore microphone, electrocardiogram, and pulse oximetry.  MEDICATIONS Medications self-administered by patient taken the night of the study : AMITIZA, AMLODIPINE, ETODOLAC, GABAPENTIN, HYDROXYCHLOROQUINE  SLEEP ARCHITECTURE The study was initiated at 10:33:48 PM and ended at 4:51:48 AM.  Sleep onset time was 90.4 minutes and the sleep efficiency was 52.1%%. The total sleep time was 197 minutes.  Stage REM latency was 180.0 minutes.  The patient spent 12.7%% of the night in stage N1 sleep, 61.7%% in stage N2 sleep, 0.0%% in stage N3 and 25.6% in REM.  Alpha intrusion was absent.  Supine sleep was 96.45%.  RESPIRATORY PARAMETERS The overall apnea/hypopnea index (AHI) was 0.6 per hour. The respiratory disturbance index (RDI) was 1.5/h. There were 0 total apneas, including 0 obstructive, 0 central and 0 mixed apneas. There were 2 hypopneas and 3 RERAs.  The AHI during Stage REM sleep was 0.0 per hour.  AHI while supine was 0.6 per hour.  The mean oxygen saturation was 97.2%. The minimum SpO2 during sleep was 93.0%.  Moderate snoring was noted during this study.  CARDIAC  DATA The 2 lead EKG demonstrated sinus rhythm. The mean heart rate was 67.2 beats per minute. Other EKG findings include: None.  LEG MOVEMENT DATA The total PLMS were 0 with a resulting PLMS index of 0.0. Associated arousal with leg movement index was 0.0 .  IMPRESSIONS - No significant obstructive sleep apnea occurred during this study (AHI  0.6/h; RDI 1.5/h). - No significant central sleep apnea occurred during this study (CAI 0.0/h). - The patient had no oxygen desaturation during the study (Min O2 93.0%). - Reduced sleep efficiency at 52.1% with prolonged latency to REM sleep. - The patient snored with moderate snoring volume. - No cardiac abnormalities were noted during this study. - Clinically significant periodic limb movements did not occur during sleep. No significant associated arousals.  DIAGNOSIS - Snoring  RECOMMENDATIONS - At present there is no indication for CPAP therapy. - Effort should be made to optimize nasal and oropharyngeal patency. - Consider alternatives for the treatment of snoring. - Avoid alcohol, sedatives and other CNS depressants that may worsen sleep apnea and disrupt normal sleep architecture. - Sleep hygiene should be reviewed to assess factors that may improve sleep quality. - Weight management (BMI 44) and regular exercise should be initiated or continued if appropriate.  [Electronically signed] 03/05/2019 01:17 PM  Shelva Majestic MD, [facility], [place], American Board of Sleep Medicine   NPI: [id number] [place] PH: [phone]   FX: [phone] [place]

## 2019-03-06 ENCOUNTER — Other Ambulatory Visit: Payer: Self-pay

## 2019-03-15 ENCOUNTER — Telehealth: Payer: Self-pay | Admitting: *Deleted

## 2019-03-15 NOTE — Telephone Encounter (Signed)
Patient notified sleep study shows no indication for CPAP therapy.

## 2019-03-19 ENCOUNTER — Ambulatory Visit (INDEPENDENT_AMBULATORY_CARE_PROVIDER_SITE_OTHER): Payer: No Typology Code available for payment source | Admitting: Psychology

## 2019-03-19 DIAGNOSIS — F320 Major depressive disorder, single episode, mild: Secondary | ICD-10-CM

## 2019-03-25 ENCOUNTER — Telehealth: Payer: Self-pay | Admitting: Neurology

## 2019-03-25 NOTE — Telephone Encounter (Signed)
Pt is needing a refill on the gabapentin (NEURONTIN) 100 MG capsule sent in to [facility] on [address].

## 2019-03-28 ENCOUNTER — Other Ambulatory Visit: Payer: Self-pay | Admitting: Nurse Practitioner

## 2019-03-28 ENCOUNTER — Other Ambulatory Visit: Payer: Self-pay | Admitting: *Deleted

## 2019-03-28 DIAGNOSIS — I10 Essential (primary) hypertension: Secondary | ICD-10-CM

## 2019-03-28 MED ORDER — GABAPENTIN 100 MG PO CAPS: ORAL_CAPSULE | ORAL | 5 refills | Status: DC

## 2019-03-28 NOTE — Telephone Encounter (Signed)
We can refill for 6 months and have a f/u with Amy

## 2019-03-28 NOTE — Telephone Encounter (Signed)
Called pt. Scheduled f/u with AL,NP for 07/07/2019 at 11:30am. I escribed refill as requested. Advised pt to call if she has any further questions/concerns.

## 2019-03-29 ENCOUNTER — Ambulatory Visit: Payer: No Typology Code available for payment source | Admitting: Gastroenterology

## 2019-03-30 ENCOUNTER — Encounter: Payer: Self-pay | Admitting: Endocrinology

## 2019-03-30 ENCOUNTER — Encounter: Payer: Self-pay | Admitting: Nurse Practitioner

## 2019-03-30 NOTE — Telephone Encounter (Signed)
Please advise 

## 2019-03-30 NOTE — Telephone Encounter (Signed)
Per Dr. George Hugh request, please call pt to move up appt. May be a VV if pt would like

## 2019-04-02 ENCOUNTER — Ambulatory Visit: Payer: No Typology Code available for payment source | Admitting: Psychology

## 2019-04-20 ENCOUNTER — Encounter: Payer: Self-pay | Admitting: Nurse Practitioner

## 2019-04-20 ENCOUNTER — Telehealth (INDEPENDENT_AMBULATORY_CARE_PROVIDER_SITE_OTHER): Payer: No Typology Code available for payment source | Admitting: Nurse Practitioner

## 2019-04-20 ENCOUNTER — Other Ambulatory Visit: Payer: Self-pay

## 2019-04-20 VITALS — BP 147/85 | HR 62 | Temp 99.2°F | Ht 62.5 in | Wt 238.0 lb

## 2019-04-20 DIAGNOSIS — I10 Essential (primary) hypertension: Secondary | ICD-10-CM

## 2019-04-20 DIAGNOSIS — M2550 Pain in unspecified joint: Secondary | ICD-10-CM | POA: Diagnosis not present

## 2019-04-20 DIAGNOSIS — M791 Myalgia, unspecified site: Secondary | ICD-10-CM

## 2019-04-20 MED ORDER — AMLODIPINE BESYLATE 10 MG PO TABS: 10.0000 mg | ORAL_TABLET | Freq: Every day | ORAL | 1 refills | Status: DC

## 2019-04-20 MED ORDER — DULOXETINE HCL 20 MG PO CPEP: 20.0000 mg | ORAL_CAPSULE | Freq: Every day | ORAL | 5 refills | Status: DC

## 2019-04-20 MED ORDER — OLMESARTAN MEDOXOMIL 5 MG PO TABS: 10.0000 mg | ORAL_TABLET | ORAL | 1 refills | Status: DC

## 2019-04-20 NOTE — Patient Instructions (Addendum)
Start cymbalta. Check BP in AM and PM. Bring BP readings to next OV on Tuesday. Call neurology for sooner appt and discuss resuming PT and/or work accomodation.

## 2019-04-20 NOTE — Progress Notes (Signed)
Virtual Visit via Video Note  I connected with@ on 04/20/19 at  1:30 PM EST by a video enabled telemedicine application and verified that I am speaking with the correct person using two identifiers.  Location: Patient:Home Provider: Office Participants: patient and provider  I discussed the limitations of evaluation and management by telemedicine and the availability of in person appointments. I also discussed with the patient that there may be a patient responsible charge related to this service. The patient expressed understanding and agreed to proceed.  CC: Leg pain and numbness of hands x 2weeks.  History of Present Illness: Chronic pain and paresthesia: Pain worse with prolonged standing and walking. Current use of etodolac and gabapentin with minimal relief Did not complete PT as recommended by neurology Normal MRI spine No recent change in medication  HTN: BP not at goal. She reports compliance with benicar and amlodipine. States BP is elevated due to uncontrolled pain Negative OSA Also under care of cardiology. BP Readings from Last 3 Encounters:  04/20/19 (!) 147/85  02/10/19 126/82  02/07/19 132/80     Observations/Objective: Physical Exam  Constitutional: No distress.  Pulmonary/Chest: Effort normal.  Psychiatric: He has a normal mood and affect. His behavior is normal. Thought content normal.  Vitals reviewed.  Assessment and Plan: Chevi was seen today for pain and numbness.  Diagnoses and all orders for this visit:  Polyarthralgia -     DULoxetine (CYMBALTA) 20 MG capsule; Take 1 capsule (20 mg total) by mouth daily.  Essential hypertension -     olmesartan (BENICAR) 5 MG tablet; Take 2 tablets (10 mg total) by mouth every morning. -     amLODipine (NORVASC) 10 MG tablet; Take 1 tablet (10 mg total) by mouth at bedtime.  Myalgia -     DULoxetine (CYMBALTA) 20 MG capsule; Take 1 capsule (20 mg total) by mouth daily.   Follow Up Instructions: See  avs   I discussed the assessment and treatment plan with the patient. The patient was provided an opportunity to ask questions and all were answered. The patient agreed with the plan and demonstrated an understanding of the instructions.   The patient was advised to call back or seek an in-person evaluation if the symptoms worsen or if the condition fails to improve as anticipated.  Vassie Loll, NP

## 2019-04-21 ENCOUNTER — Encounter: Payer: Self-pay | Admitting: Nurse Practitioner

## 2019-04-22 ENCOUNTER — Encounter: Payer: No Typology Code available for payment source | Admitting: Nurse Practitioner

## 2019-04-26 ENCOUNTER — Encounter: Payer: No Typology Code available for payment source | Admitting: Nurse Practitioner

## 2019-05-03 ENCOUNTER — Other Ambulatory Visit: Payer: Self-pay

## 2019-05-03 ENCOUNTER — Other Ambulatory Visit: Payer: Self-pay | Admitting: Gastroenterology

## 2019-05-04 ENCOUNTER — Encounter: Payer: Self-pay | Admitting: Nurse Practitioner

## 2019-05-04 ENCOUNTER — Ambulatory Visit (INDEPENDENT_AMBULATORY_CARE_PROVIDER_SITE_OTHER): Payer: No Typology Code available for payment source | Admitting: Nurse Practitioner

## 2019-05-04 VITALS — BP 118/80 | HR 67 | Temp 97.2°F | Ht 62.0 in | Wt 232.2 lb

## 2019-05-04 DIAGNOSIS — E0590 Thyrotoxicosis, unspecified without thyrotoxic crisis or storm: Secondary | ICD-10-CM

## 2019-05-04 DIAGNOSIS — R270 Ataxia, unspecified: Secondary | ICD-10-CM

## 2019-05-04 DIAGNOSIS — R739 Hyperglycemia, unspecified: Secondary | ICD-10-CM | POA: Diagnosis not present

## 2019-05-04 DIAGNOSIS — Z136 Encounter for screening for cardiovascular disorders: Secondary | ICD-10-CM

## 2019-05-04 DIAGNOSIS — M2550 Pain in unspecified joint: Secondary | ICD-10-CM | POA: Diagnosis not present

## 2019-05-04 DIAGNOSIS — Z0001 Encounter for general adult medical examination with abnormal findings: Secondary | ICD-10-CM | POA: Diagnosis not present

## 2019-05-04 DIAGNOSIS — Z13220 Encounter for screening for lipoid disorders: Secondary | ICD-10-CM

## 2019-05-04 DIAGNOSIS — I10 Essential (primary) hypertension: Secondary | ICD-10-CM | POA: Diagnosis not present

## 2019-05-04 DIAGNOSIS — M791 Myalgia, unspecified site: Secondary | ICD-10-CM

## 2019-05-04 DIAGNOSIS — R7989 Other specified abnormal findings of blood chemistry: Secondary | ICD-10-CM | POA: Insufficient documentation

## 2019-05-04 DIAGNOSIS — Z9013 Acquired absence of bilateral breasts and nipples: Secondary | ICD-10-CM | POA: Insufficient documentation

## 2019-05-04 LAB — LIPID PANEL
Cholesterol: 148 mg/dL (ref 0–200)
HDL: 38.1 mg/dL — ABNORMAL LOW (ref >39.00)
LDL Cholesterol: 94 mg/dL (ref 0–99)
NonHDL: 109.45
Total CHOL/HDL Ratio: 4
Triglycerides: 76 mg/dL (ref 0.0–149.0)
VLDL: 15.2 mg/dL (ref 0.0–40.0)

## 2019-05-04 LAB — CK: Total CK: 218 U/L — ABNORMAL HIGH (ref 7–177)

## 2019-05-04 LAB — TSH: TSH: 0.15 u[IU]/mL — ABNORMAL LOW (ref 0.35–4.50)

## 2019-05-04 MED ORDER — DULOXETINE HCL 20 MG PO CPEP: 20.0000 mg | ORAL_CAPSULE | Freq: Two times a day (BID) | ORAL | 5 refills | Status: DC

## 2019-05-04 NOTE — Patient Instructions (Addendum)
Improved hgbA1c 6.1 to 5.5 Normal lipid panel Persistent elevated CK (muscle enzymes). Maintain upcoming appt with neurology. Persistent low Tsh which indicates hyperthyroidism. Ordered thyroid US.  Increase cymbalta to 20mg  BID  Maintain appt with neurology  You will be contacted to resume PT.  Go to lab for blood draw.

## 2019-05-04 NOTE — Assessment & Plan Note (Signed)
S/p bilateral mastectomy 2019 by surgeon in [place]. IM testosterone managed by Dr. Everardo All (endo), intermittent breakthrough vaginal bleed in last 16months. Up to date with PAP (01/2018: normal) Plans to get total hysterectomy sometime this year?

## 2019-05-04 NOTE — Progress Notes (Signed)
Subjective:    Patient ID: Madeline Larsen, adult    DOB: 1986/02/12, 34 y.o.   MRN: [id number]  Patient presents today for complete physical and management of chronic conditions  HPI  Unsteady gait due to Leg and hip pain (L>R) worsening pain with prolong standing and walking, has discomfort with walking <236f. No change in pain with cymbalta 264m denies any adverse side effects. Upcoming appt with neurology. Normal cervical and brain MRI.  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 ElLoanne Drilling plans to get total hysterectomy later this year. Up to date with PAP (normal)  Depression/Suicide: [facility] 05/04/2019 01/25/2019 01/26/2018 07/17/2017 07/02/2017  Decreased Interest 0 0 0 0 0  Down, Depressed, Hopeless 0 0 0 0 0  PHQ - 2 Score 0 0 0 0 0  Altered sleeping - - 2 - -  Tired, decreased energy - - 0 - -  Change in appetite - - 0 - -  Feeling bad or failure about yourself  - - 0 - -  Trouble concentrating - - 0 - -  Moving slowly or fidgety/restless - - 0 - -  Suicidal thoughts - - 0 - -  PHQ-9 Score - - 2 - -   Vision:up to date  Dental:up to date  Immunizations: (TDAP, Hep C screen, Pneumovax, Influenza, zoster)  Health Maintenance  Topic Date Due  . Flu Shot  06/08/2019*  . Pap Smear  01/26/2021  . Tetanus Vaccine  01/27/2028  . HIV Screening  Completed  *Topic was postponed. The date shown is not the original due date.   Diet:DASH diet.  Weight:  Wt Readings from Last 3 Encounters:  05/04/19 232 lb 3.2 oz (105.3 kg)  04/20/19 238 lb (108 kg)  03/01/19 247 lb (112 kg)   Exercise:walking, stretching and strengthening exercise  Fall Risk: Fall Risk  05/04/2019 01/25/2019 07/06/2018  07/02/2017  Falls in the past year? 0 0 0 No  Number falls in past yr: 0 - - -  Injury with Fall? 0 - - -   Medications and allergies reviewed with patient and updated if appropriate.  Patient Active Problem List   Diagnosis Date Noted  . S/P mastectomy, bilateral 05/04/2019  . Low TSH level 05/04/2019  . Hyperglycemia 05/04/2019  . Snoring 03/01/2019  . Essential hypertension 01/25/2019  . Chronic idiopathic constipation 01/04/2019  . GERD (gastroesophageal reflux disease) 12/28/2018  . Myalgia 12/16/2018  . Polyarthralgia 12/16/2018  . ANA positive 12/16/2018  . Neutropenia ([place]11/20/2019  . 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 Headache 06/18/2017  . 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 Status post left foot surgery 04/09/2015  . HAV (hallux abducto valgus) 09/05/2014    Current Outpatient Medications on File Prior to Visit  Medication Sig Dispense Refill  . amLODipine (NORVASC) 10 MG tablet Take 1 tablet (10 mg total) by mouth at bedtime. 90 tablet 1  . [place] Oil-Levomenthol (FDGARD) 25-20.75 MG CAPS Take 2 capsules by mouth 2 (two) times daily. Take as directed, daily 12 capsule 0  . etodolac (LODINE) 400 MG tablet Take 1 tablet (400 mg total) by mouth 2 (two) times daily. 60 tablet 5  . gabapentin (NEURONTIN) 100 MG capsule One po qAM; one po qPM and two po qHS 120  capsule 5  . hydroxychloroquine (PLAQUENIL) 200 MG tablet TAKE 1 TABLET BY MOUTH TWICE A DAY (Patient taking differently: 200 mg. ) 180 tablet 0  . lubiprostone (AMITIZA) 8 MCG capsule Take 3 capsules (24 mcg total) by mouth daily. 90 capsule 5  . olmesartan (BENICAR) 5 MG tablet Take 2 tablets (10 mg total) by mouth every morning. 180 tablet 1  . ondansetron (ZOFRAN ODT) 8 MG disintegrating tablet Take 1 tablet (8 mg total) by mouth every 8 (eight) hours as needed for nausea or vomiting. 30 tablet 0  . pantoprazole (PROTONIX) 40 MG tablet TAKE 1 TABLET (40 MG TOTAL) BY MOUTH DAILY. FOR 8 WEEKS 90  tablet 1  . testosterone cypionate (DEPOTESTOSTERONE CYPIONATE) 200 MG/ML injection Inject 0.4 mLs (80 mg total) into the muscle once a week. 10 mL 0   No current facility-administered medications on file prior to visit.    Past Medical History:  Diagnosis Date  . Depression   . Female-to-female transgender person   . GERD (gastroesophageal reflux disease)   . MRSA (methicillin resistant staph aureus) culture positive     Past Surgical History:  Procedure Laterality Date  . FOOT SURGERY Left   . top surgery  02/2018    Social History   Socioeconomic History  . Marital status: Married    Spouse name: Not on file  . Number of children: 2  . Years of education: Not on file  . Highest education level: High school graduate  Occupational History  . Not on file  Tobacco Use  . Smoking status: Never Smoker  . Smokeless tobacco: Never Used  Substance and Sexual Activity  . Alcohol use: Yes    Alcohol/week: 0.0 standard drinks    Comment: socially  . Drug use: Not Currently    Types: Marijuana  . Sexual activity: Yes    Birth control/protection: None    Comment: female partner  Other Topics Concern  . Not on file  Social History Narrative   Lives at home with wife & kids   Right handed   Social Determinants of Health   Financial Resource Strain:   . Difficulty of Paying Living Expenses: Not on file  Food Insecurity:   . Worried About Charity fundraiser in the Last Year: Not on file  . Ran Out of Food in the Last Year: Not on file  Transportation Needs:   . Lack of Transportation (Medical): Not on file  . Lack of Transportation (Non-Medical): Not on file  Physical Activity:   . Days of Exercise per Week: Not on file  . Minutes of Exercise per Session: Not on file  Stress:   . Feeling of Stress : Not on file  Social Connections:   . Frequency of Communication with Friends and Family: Not on file  . Frequency of Social Gatherings with Friends and Family: Not on file   . Attends Religious Services: Not on file  . Active Member of Clubs or Organizations: Not on file  . Attends Archivist Meetings: Not on file  . Marital Status: Not on file    Family History  Problem Relation Age of Onset  . Alcohol abuse Mother   . Cancer Mother 66       Uterine cancer  . Drug abuse Mother   . HIV/AIDS Mother   . Early death Mother 97       uterine cancer  . Alcohol abuse Father   . Drug abuse Father   .  Polymyositis Father 69       deceased at age 16  . Early death Father 30       polymyositis  . Alcohol abuse Sister   . Depression Sister   . Drug abuse Sister   . Depression Brother   . Kidney disease Maternal Grandmother        ESRD on dialysis  . Hypertension Maternal Grandmother   . Rheum arthritis Paternal Grandmother   . Rheum arthritis Brother   . Depression Sister   . Depression Sister   . Depression Sister   . Lupus Cousin   . Lupus Cousin   . Lupus Cousin   . Prostate cancer Paternal Uncle         Review of Systems  Constitutional: Negative for fever, malaise/fatigue and weight loss.  HENT: Negative for congestion and sore throat.   Eyes:       Negative for visual changes  Respiratory: Negative for cough and shortness of breath.   Cardiovascular: Negative for chest pain, palpitations and leg swelling.  Gastrointestinal: Negative for abdominal pain, blood in stool, constipation, diarrhea and heartburn.  Genitourinary: Negative for dysuria, frequency and urgency.  Musculoskeletal: Positive for joint pain and myalgias. Negative for back pain and falls.  Skin: Negative for rash.  Neurological: Negative for dizziness, sensory change and headaches.  Endo/Heme/Allergies: Does not bruise/bleed easily.  Psychiatric/Behavioral: Negative for depression, substance abuse and suicidal ideas. The patient is not nervous/anxious.     Objective:   Vitals:   05/04/19 1138  BP: 118/80  Pulse: 67  Temp: (!) 97.2 F (36.2 C)  SpO2:  98%    Body mass index is 42.47 kg/m.   Physical Examination:  Physical Exam Vitals reviewed.  HENT:     Right Ear: Tympanic membrane, ear canal and external ear normal.     Left Ear: Tympanic membrane, ear canal and external ear normal.  Eyes:     Extraocular Movements: Extraocular movements intact.     Conjunctiva/sclera: Conjunctivae normal.     Pupils: Pupils are equal, round, and reactive to light.  Cardiovascular:     Rate and Rhythm: Normal rate and regular rhythm.     Pulses: Normal pulses.     Heart sounds: Normal heart sounds.  Pulmonary:     Effort: Pulmonary effort is normal.     Breath sounds: Normal breath sounds.  Abdominal:     General: Bowel sounds are normal.     Palpations: Abdomen is soft.  Musculoskeletal:        General: No swelling.     Cervical back: Normal range of motion and neck supple.     Right lower leg: No edema.     Left lower leg: No edema.  Lymphadenopathy:     Cervical: No cervical adenopathy.  Skin:    General: Skin is warm and dry.     Findings: No erythema or rash.  Neurological:     Mental Status: He is alert and oriented to person, place, and time.     Cranial Nerves: No dysarthria or facial asymmetry.     Sensory: Sensation is intact.     Motor: Weakness present. No tremor.     Coordination: Romberg sign positive. Coordination abnormal. Heel to Shin Test normal.     Gait: Gait abnormal.     Deep Tendon Reflexes: Reflexes abnormal. Babinski sign absent on the right side. Babinski sign absent on the left side.     Reflex Scores:  Patellar reflexes are 2+ on the right side and 1+ on the left side.      Achilles reflexes are 2+ on the right side and 1+ on the left side. Psychiatric:        Mood and Affect: Mood normal.        Behavior: Behavior normal.        Thought Content: Thought content normal.    ASSESSMENT and PLAN: This visit occurred during the SARS-CoV-2 public health emergency.  Safety protocols were in place,  including screening questions prior to the visit, additional usage of staff PPE, and extensive cleaning of exam room while observing appropriate contact time as indicated for disinfecting solutions.   Madeline Larsen was seen today for annual exam.  Diagnoses and all orders for this visit:  Encounter for preventative adult health care exam with abnormal findings  Essential hypertension  Polyarthralgia -     CK (Creatine Kinase) -     DULoxetine (CYMBALTA) 20 MG capsule; Take 1 capsule (20 mg total) by mouth 2 (two) times daily. -     Ambulatory referral to Physical Therapy  Myalgia -     CK (Creatine Kinase) -     DULoxetine (CYMBALTA) 20 MG capsule; Take 1 capsule (20 mg total) by mouth 2 (two) times daily. -     Ambulatory referral to Physical Therapy -     US THYROID; Future  Subclinical hyperthyroidism -     TSH -     US THYROID; Future  Encounter for lipid screening for cardiovascular disease -     Lipid panel  Hyperglycemia -     Hemoglobin A1c  Ataxia -     Ambulatory referral to Physical Therapy   Female-to-female transgender person S/p bilateral mastectomy 2019 by surgeon in [place]. IM testosterone managed by Dr. Loanne Drilling (endo), intermittent breakthrough vaginal bleed in last 82month. Up to date with PAP (01/2018: normal) Plans to get total hysterectomy sometime this year?  Myalgia Persistent elevated CK, normal ESR. Upcoming appt with  Dr. SFelecia Shelling(neurology) Normal brain and cervical MRI.       Problem List Items Addressed This Visit      Cardiovascular and Mediastinum   Essential hypertension     Other   Hyperglycemia   Relevant Orders   Hemoglobin A1c (Completed)   Low TSH level   Myalgia    Persistent elevated CK, normal ESR. Upcoming appt with  Dr. SFelecia Shelling(neurology) Normal brain and cervical MRI.       Relevant Medications   DULoxetine (CYMBALTA) 20 MG capsule   Other Relevant Orders   CK (Creatine Kinase) (Completed)   Ambulatory referral to  Physical Therapy   [place]   Polyarthralgia   Relevant Medications   DULoxetine (CYMBALTA) 20 MG capsule   Other Relevant Orders   CK (Creatine Kinase) (Completed)   Ambulatory referral to Physical Therapy    Other Visit Diagnoses    Encounter for preventative adult health care exam with abnormal findings    -  Primary   Encounter for lipid screening for cardiovascular disease       Relevant Orders   Lipid panel (Completed)   Ataxia       Relevant Orders   Ambulatory referral to Physical Therapy       Follow up: Return in about 3 months (around 08/01/2019) for myalgia and ataxia, HTN (video, 349ms).  ChWilfred LacyNP

## 2019-05-05 LAB — HEMOGLOBIN A1C: Hgb A1c MFr Bld: 5.5 % (ref 4.6–6.5)

## 2019-05-06 NOTE — Assessment & Plan Note (Addendum)
Persistent elevated CK, normal ESR. Upcoming appt with  Dr. Felecia Shelling (neurology) Normal brain and cervical MRI.

## 2019-05-08 NOTE — Progress Notes (Deleted)
Cardiology Office Note:    Date:  05/08/2019   ID:  Madeline Larsen, DOB 10-25-1985, MRN [id number]  PCP:  Anne Ng, NP  Cardiologist:  No primary care provider on file.  Electrophysiologist:  None   Referring MD: Anne Ng, NP   No chief complaint on file.   History of Present Illness:    Madeline Larsen is a 34 y.o. adult with a hx of hypertension who presents for follow-up.  Is referred by Alysia Penna, NP for evaluation of hypertension.  Diagnosed with HTN recently, started on benicar and amlodpine.  Currently on Benicar 10 mg and amlodipine 10 mg daily.  Reports BP has been well controlled since titrating up to current doses.  Has been 110-130s/ 50-80s.    Has not been exercising due to leg pain.  Started on gabapentin and that has been helping.  Planning to start physical therapy but was not started due to elevated BP.   Reports at night will get tightness in chest.  Describes as tightness in center of chest, lasts for 1-2 hours.  Does not occur with exertion.  He reports that he wakes up frequently throughout the night and is tired throughout the day.  He used to smoke marijuana but quit in October.  Does not smoke cigarettes.  His father died of polymyositis at age 20; he thinks he had heart issues as well but is unsure of the details.  Given age, secondary work-up of hypertension was done.  CMET, TSH, serum metanephrines, aldosterone unremarkable.  TTE on 02/24/2019 was unremarkable.  Sleep study was unremarkable.    Past Medical History:  Diagnosis Date  . Depression   . Female-to-female transgender person   . GERD (gastroesophageal reflux disease)   . MRSA (methicillin resistant staph aureus) culture positive     Past Surgical History:  Procedure Laterality Date  . FOOT SURGERY Left   . top surgery  02/2018    Current Medications: No outpatient medications have been marked as taking for the 05/12/19 encounter (Appointment) with Little Ishikawa, MD.     Allergies:   Patient has no known allergies.   Social History   Socioeconomic History  . Marital status: Married    Spouse name: Not on file  . Number of children: 2  . Years of education: Not on file  . Highest education level: High school graduate  Occupational History  . Not on file  Tobacco Use  . Smoking status: Never Smoker  . Smokeless tobacco: Never Used  Substance and Sexual Activity  . Alcohol use: Yes    Alcohol/week: 0.0 standard drinks    Comment: socially  . Drug use: Not Currently    Types: Marijuana  . Sexual activity: Yes    Birth control/protection: None    Comment: female partner  Other Topics Concern  . Not on file  Social History Narrative   Lives at home with wife & kids   Right handed   Social Determinants of Health   Financial Resource Strain:   . Difficulty of Paying Living Expenses: Not on file  Food Insecurity:   . Worried About Programme researcher, broadcasting/film/video in the Last Year: Not on file  . Ran Out of Food in the Last Year: Not on file  Transportation Needs:   . Lack of Transportation (Medical): Not on file  . Lack of Transportation (Non-Medical): Not on file  Physical Activity:   . Days of Exercise per Week: Not on  file  . Minutes of Exercise per Session: Not on file  Stress:   . Feeling of Stress : Not on file  Social Connections:   . Frequency of Communication with Friends and Family: Not on file  . Frequency of Social Gatherings with Friends and Family: Not on file  . Attends Religious Services: Not on file  . Active Member of Clubs or Organizations: Not on file  . Attends Banker Meetings: Not on file  . Marital Status: Not on file     Family History: The patient's family history includes Alcohol abuse in his father, mother, and sister; Cancer (age of onset: 59) in his mother; Depression in his brother, sister, sister, sister, and sister; Drug abuse in his father, mother, and sister; Early death (age of onset: 68)  in his mother; Early death (age of onset: 1) in his father; HIV/AIDS in his mother; Hypertension in his maternal grandmother; Kidney disease in his maternal grandmother; Lupus in his cousin, cousin, and cousin; Polymyositis (age of onset: 6) in his father; Prostate cancer in his paternal uncle; Rheum arthritis in his brother and paternal grandmother.  ROS:   Please see the history of present illness.     All other systems reviewed and are negative.  EKGs/Labs/Other Studies Reviewed:    The following studies were reviewed today:   EKG:  EKG is  ordered today.  The ekg ordered today demonstrates normal sinus rhythm, rate 62, no ST/T abnormalities  Recent Labs: 12/28/2018: ALT 15; Hemoglobin 13.6; Magnesium 2.0; Platelets 238 01/25/2019: BUN 9; Creat 0.82; Potassium 3.8; Sodium 139 05/04/2019: TSH 0.15  Recent Lipid Panel    Component Value Date/Time   CHOL 148 05/04/2019 1218   TRIG 76.0 05/04/2019 1218   HDL 38.10 (L) 05/04/2019 1218   CHOLHDL 4 05/04/2019 1218   VLDL 15.2 05/04/2019 1218   LDLCALC 94 05/04/2019 1218    TTE 02/24/19: 1. Left ventricular ejection fraction, by visual estimation, is 55 to  60%. The left ventricle has normal function. There is no left ventricular  hypertrophy.  2. The left ventricle has no regional wall motion abnormalities.  3. Global right ventricle has normal systolic function.The right  ventricular size is normal. No increase in right ventricular wall  thickness.  4. Left atrial size was normal.  5. Right atrial size was normal.  6. The mitral valve is normal in structure. Mild mitral valve  regurgitation. No evidence of mitral stenosis.  7. The tricuspid valve is normal in structure. Tricuspid valve  regurgitation is trivial.  8. The aortic valve is normal in structure. Aortic valve regurgitation is  not visualized. No evidence of aortic valve sclerosis or stenosis.  9. The pulmonic valve was normal in structure. Pulmonic valve    regurgitation is not visualized.  10. The inferior vena cava is normal in size with greater than 50%  respiratory variability, suggesting right atrial pressure of 3 mmHg.   Physical Exam:    VS:  There were no vitals taken for this visit.    Wt Readings from Last 3 Encounters:  05/04/19 232 lb 3.2 oz (105.3 kg)  04/20/19 238 lb (108 kg)  03/01/19 247 lb (112 kg)     GEN:   in no acute distress HEENT: Normal NECK: No JVD; No carotid bruits LYMPHATICS: No lymphadenopathy CARDIAC: RRR, no murmurs, rubs, gallops RESPIRATORY:  Clear to auscultation without rales, wheezing or rhonchi  ABDOMEN: Soft, non-tender, non-distended MUSCULOSKELETAL:  No edema; No deformity  SKIN:  Warm and dry NEUROLOGIC:  Alert and oriented x 3 PSYCHIATRIC:  Normal affect   ASSESSMENT:    No diagnosis found. PLAN:    In order of problems listed above:  Hypertension: Appears well controlled on amlodipine 10 mg daily and Benicar 10 mg daily.  Given age, warrants work-up for secondary causes.    - Reports symptoms concerning for OSA, will check sleep study - CMET, TSH, serum metanephrines unremarkable.  Will check renin/aldosterone - TTE   Chest pain: Suspect noncardiac etiology, as describes nonexertional pain that occurs when she lies down at night.  RTC in ***   Medication Adjustments/Labs and Tests Ordered: Current medicines are reviewed at length with the patient today.  Concerns regarding medicines are outlined above.  No orders of the defined types were placed in this encounter.  No orders of the defined types were placed in this encounter.   There are no Patient Instructions on file for this visit.   Signed, Donato Heinz, MD  05/08/2019 9:29 PM    [place]

## 2019-05-10 ENCOUNTER — Other Ambulatory Visit: Payer: Self-pay | Admitting: Endocrinology

## 2019-05-10 ENCOUNTER — Encounter: Payer: Self-pay | Admitting: Family Medicine

## 2019-05-10 ENCOUNTER — Other Ambulatory Visit: Payer: Self-pay

## 2019-05-10 ENCOUNTER — Ambulatory Visit (INDEPENDENT_AMBULATORY_CARE_PROVIDER_SITE_OTHER): Payer: No Typology Code available for payment source | Admitting: Family Medicine

## 2019-05-10 VITALS — BP 129/85 | HR 59 | Temp 97.9°F | Ht 62.0 in | Wt 232.2 lb

## 2019-05-10 DIAGNOSIS — R200 Anesthesia of skin: Secondary | ICD-10-CM

## 2019-05-10 DIAGNOSIS — M791 Myalgia, unspecified site: Secondary | ICD-10-CM | POA: Diagnosis not present

## 2019-05-10 DIAGNOSIS — R269 Unspecified abnormalities of gait and mobility: Secondary | ICD-10-CM | POA: Diagnosis not present

## 2019-05-10 MED ORDER — GABAPENTIN 100 MG PO CAPS: ORAL_CAPSULE | ORAL | 5 refills | Status: DC

## 2019-05-10 NOTE — Telephone Encounter (Signed)
Please review and refill if appropriate 

## 2019-05-10 NOTE — Progress Notes (Signed)
I have read the note, and I agree with the clinical assessment and plan.  Kristyna Bradstreet A. Zuzanna Maroney, MD, PhD, FAAN Certified in Neurology, Clinical Neurophysiology, Sleep Medicine, Pain Medicine and Neuroimaging  [facility] [address], [address] [place], [place] [address] [phone]  

## 2019-05-10 NOTE — Patient Instructions (Signed)
We will increase gabapentin to 200mg  three times daily, please monitor for increased sedation or other side effects as discussed.   Continue plans for PT. Follow up with PCP and rheumatology closely.   Follow up with neurology in 3-6 months   Gabapentin capsules or tablets What is this medicine? GABAPENTIN (GA ba pen tin) is used to control seizures in certain types of epilepsy. It is also used to treat certain types of nerve pain. This medicine may be used for other purposes; ask your health care provider or pharmacist if you have questions. COMMON BRAND NAME(S): Active-PAC with Gabapentin, Gabarone, Neurontin What should I tell my health care provider before I take this medicine? They need to know if you have any of these conditions:  history of drug abuse or alcohol abuse problem  kidney disease  lung or breathing disease  suicidal thoughts, plans, or attempt; a previous suicide attempt by you or a family member  an unusual or allergic reaction to gabapentin, other medicines, foods, dyes, or preservatives  pregnant or trying to get pregnant  breast-feeding How should I use this medicine? Take this medicine by mouth with a glass of water. Follow the directions on the prescription label. You can take it with or without food. If it upsets your stomach, take it with food. Take your medicine at regular intervals. Do not take it more often than directed. Do not stop taking except on your doctor's advice. If you are directed to break the 600 or 800 mg tablets in half as part of your dose, the extra half tablet should be used for the next dose. If you have not used the extra half tablet within 28 days, it should be thrown away. A special MedGuide will be given to you by the pharmacist with each prescription and refill. Be sure to read this information carefully each time. Talk to your pediatrician regarding the use of this medicine in children. While this drug may be prescribed for children  as young as 3 years for selected conditions, precautions do apply. Overdosage: If you think you have taken too much of this medicine contact a poison control center or emergency room at once. NOTE: This medicine is only for you. Do not share this medicine with others. What if I miss a dose? If you miss a dose, take it as soon as you can. If it is almost time for your next dose, take only that dose. Do not take double or extra doses. What may interact with this medicine? This medicine may interact with the following medications:  alcohol  antihistamines for allergy, cough, and cold  certain medicines for anxiety or sleep  certain medicines for depression like amitriptyline, fluoxetine, sertraline  certain medicines for seizures like phenobarbital, primidone  certain medicines for stomach problems  general anesthetics like halothane, isoflurane, methoxyflurane, propofol  local anesthetics like lidocaine, pramoxine, tetracaine  medicines that relax muscles for surgery  narcotic medicines for pain  phenothiazines like chlorpromazine, mesoridazine, prochlorperazine, thioridazine This list may not describe all possible interactions. Give your health care provider a list of all the medicines, herbs, non-prescription drugs, or dietary supplements you use. Also tell them if you smoke, drink alcohol, or use illegal drugs. Some items may interact with your medicine. What should I watch for while using this medicine? Visit your doctor or health care provider for regular checks on your progress. You may want to keep a record at home of how you feel your condition is responding to treatment.  You may want to share this information with your doctor or health care provider at each visit. You should contact your doctor or health care provider if your seizures get worse or if you have any new types of seizures. Do not stop taking this medicine or any of your seizure medicines unless instructed by your  doctor or health care provider. Stopping your medicine suddenly can increase your seizures or their severity. This medicine may cause serious skin reactions. They can happen weeks to months after starting the medicine. Contact your health care provider right away if you notice fevers or flu-like symptoms with a rash. The rash may be red or purple and then turn into blisters or peeling of the skin. Or, you might notice a red rash with swelling of the face, lips or lymph nodes in your neck or under your arms. Wear a medical identification bracelet or chain if you are taking this medicine for seizures, and carry a card that lists all your medications. You may get drowsy, dizzy, or have blurred vision. Do not drive, use machinery, or do anything that needs mental alertness until you know how this medicine affects you. To reduce dizzy or fainting spells, do not sit or stand up quickly, especially if you are an older patient. Alcohol can increase drowsiness and dizziness. Avoid alcoholic drinks. Your mouth may get dry. Chewing sugarless gum or sucking hard candy, and drinking plenty of water will help. The use of this medicine may increase the chance of suicidal thoughts or actions. Pay special attention to how you are responding while on this medicine. Any worsening of mood, or thoughts of suicide or dying should be reported to your health care provider right away. Women who become pregnant while using this medicine may enroll in the [place] American Antiepileptic Drug Pregnancy Registry by calling [phone]. This registry collects information about the safety of antiepileptic drug use during pregnancy. What side effects may I notice from receiving this medicine? Side effects that you should report to your doctor or health care professional as soon as possible:  allergic reactions like skin rash, itching or hives, swelling of the face, lips, or tongue  breathing problems  rash, fever, and swollen lymph  nodes  redness, blistering, peeling or loosening of the skin, including inside the mouth  suicidal thoughts, mood changes Side effects that usually do not require medical attention (report to your doctor or health care professional if they continue or are bothersome):  dizziness  drowsiness  headache  nausea, vomiting  swelling of ankles, feet, hands  tiredness This list may not describe all possible side effects. Call your doctor for medical advice about side effects. You may report side effects to FDA at 1-800-FDA-1088. Where should I keep my medicine? Keep out of reach of children. This medicine may cause accidental overdose and death if it taken by other adults, children, or pets. Mix any unused medicine with a substance like cat litter or coffee grounds. Then throw the medicine away in a sealed container like a sealed bag or a coffee can with a lid. Do not use the medicine after the expiration date. Store at room temperature between 15 and 30 degrees C (59 and 86 degrees F). NOTE: This sheet is a summary. It may not cover all possible information. If you have questions about this medicine, talk to your doctor, pharmacist, or health care provider.  2020 Elsevier/Gold Standard (2018-05-28 14:16:43)   Muscle Pain, Adult Muscle pain (myalgia) may be mild or  severe. In most cases, the pain lasts only a short time and it goes away without treatment. It is normal to feel some muscle pain after starting a workout program. Muscles that have not been used often will be sore at first. Muscle pain may also be caused by many other things, including:  Overuse or muscle strain, especially if you are not in shape. This is the most common cause of muscle pain.  Injury.  Bruises.  Viruses, such as the flu.  Infectious diseases.  A chronic condition that causes muscle tenderness, fatigue, and headache (fibromyalgia).  A condition, such as lupus, in which the body's disease-fighting  system attacks other organs in the body (autoimmune or rheumatologic diseases).  Certain drugs, including ACE inhibitors and statins. To diagnose the cause of your muscle pain, your health care provider will do a physical exam and ask questions about the pain and when it began. If you have not had muscle pain for very long, your health care provider may want to wait before doing much testing. If your muscle pain has lasted a long time, your health care provider may want to run tests right away. In some cases, this may include tests to rule out certain conditions or illnesses. Treatment for muscle pain depends on the cause. Home care is often enough to relieve muscle pain. Your health care provider may also prescribe anti-inflammatory medicine. Follow these instructions at home: Activity  If overuse is causing your muscle pain: ? Slow down your activities until the pain goes away. ? Do regular, gentle exercises if you are not usually active. ? Warm up before exercising. Stretch before and after exercising. This can help lower the risk of muscle pain.  Do not continue working out if the pain is very bad. Bad pain could mean that you have injured a muscle. Managing pain and discomfort   If directed, apply ice to the sore muscle: ? Put ice in a plastic bag. ? Place a towel between your skin and the bag. ? Leave the ice on for 20 minutes, 2-3 times a day.  You may also alternate between applying ice and applying heat as told by your health care provider. To apply heat, use the heat source that your health care provider recommends, such as a moist heat pack or a heating pad. ? Place a towel between your skin and the heat source. ? Leave the heat on for 20-30 minutes. ? Remove the heat if your skin turns bright red. This is especially important if you are unable to feel pain, heat, or cold. You may have a greater risk of getting burned. Medicines  Take over-the-counter and prescription medicines  only as told by your health care provider.  Do not drive or use heavy machinery while taking prescription pain medicine. Contact a health care provider if:  Your muscle pain gets worse and medicines do not help.  You have muscle pain that lasts longer than 3 days.  You have a rash or fever along with muscle pain.  You have muscle pain after a tick bite.  You have muscle pain while working out, even though you are in good physical condition.  You have redness, soreness, or swelling along with muscle pain.  You have muscle pain after starting a new medicine or changing the dose of a medicine. Get help right away if:  You have trouble breathing.  You have trouble swallowing.  You have muscle pain along with a stiff neck, fever, and vomiting.  You have severe muscle weakness or cannot move part of your body. This information is not intended to replace advice given to you by your health care provider. Make sure you discuss any questions you have with your health care provider. Document Revised: 02/06/2017 Document Reviewed: 07/17/2015 Elsevier Patient Education  [place].   Joint Pain  Joint pain can be caused by many things. It is likely to go away if you follow instructions from your doctor for taking care of yourself at home. Sometimes, you may need more treatment. Follow these instructions at home: Managing pain, stiffness, and swelling   If told, put ice on the painful area. ? Put ice in a plastic bag. ? Place a towel between your skin and the bag. ? Leave the ice on for 20 minutes, 2-3 times a day.  If told, put heat on the painful area. Do this as often as told by your doctor. Use the heat source that your doctor recommends, such as a moist heat pack or a heating pad. ? Place a towel between your skin and the heat source. ? Leave the heat on for 20-30 minutes. ? Take off the heat if your skin gets bright red. This is especially important if you are unable to  feel pain, heat, or cold. You may have a greater risk of getting burned.  Move your fingers or toes below the painful joint often. This helps with stiffness and swelling.  If possible, raise (elevate) the painful joint above the level of your heart while you are sitting or lying down. To do this, try putting a few pillows under the painful joint. Activity  Rest the painful joint for as long as told. Do not do things that cause pain or make your pain worse.  Begin exercising or stretching the affected area, as told by your doctor. Ask your doctor what types of exercise are safe for you. If you have an elastic bandage, sling, or splint:  Wear the device as told by your doctor. Take it off only as told by your doctor.  Loosen the device if your fingers or toes below the joint: ? Tingle. ? Lose feeling (get numb). ? Get cold and blue.  Keep the device clean.  Ask your doctor if you should take off the device before bathing. You may need to cover it with a watertight covering when you take a bath or a shower. General instructions  Take over-the-counter and prescription medicines only as told by your doctor.  Do not use any products that contain nicotine or tobacco. These include cigarettes and e-cigarettes. If you need help quitting, ask your doctor.  Keep all follow-up visits as told by your doctor. This is important. Contact a doctor if:  You have pain that gets worse and does not get better with medicine.  Your joint pain does not get better in 3 days.  You have more bruising or swelling.  You have a fever.  You lose 10 lb (4.5 kg) or more without trying. Get help right away if:  You cannot move the joint.  Your fingers or toes tingle, lose feeling, or get cold and blue.  You have a fever along with a joint that is red, warm, and swollen. Summary  Joint pain can be caused by many things. It often goes away if you follow instructions from your doctor for taking care of  yourself at home.  Rest the painful joint for as long as told. Do not do  things that cause pain or make your pain worse.  Take over-the-counter and prescription medicines only as told by your doctor. This information is not intended to replace advice given to you by your health care provider. Make sure you discuss any questions you have with your health care provider. Document Revised: 02/06/2017 Document Reviewed: 12/10/2016 Elsevier Patient Education  2020 [employer].

## 2019-05-10 NOTE — Progress Notes (Signed)
PATIENT: Madeline Larsen DOB: 06-27-1985  REASON FOR VISIT: follow up HISTORY FROM: patient  Chief Complaint  Patient presents with  . Follow-up    6 mon f/u. Alone. Rm 1. Patient mentioned that he has been very weak. He stated that he noticed that his hands are starting to tremor. He mentioned that it is starting to get harder for him to walk. He has had to call out of work alot.      HISTORY OF PRESENT ILLNESS: Today 05/10/19 Madeline Larsen is a 34 y.o. adult here today for follow up for neuralgia. He has continued lower dose of gabapentin 126m in am, 1066mat lunch and 20052mt night. He continues to have joint and muscle pain.  He was referred to PT in October but reports he could not go due to elevated blood pressures. MR cervical spine was normal. He was seen by PCP last month who has referred him to PT. He is planning to begin therapy next week. He has had an extensive workup with rheumatology. He continues Plaquenil but doesn't feel that it has helped much. He had a sleep study and reports it was normal. He does not rest well. He reports seeing [facility] and feels this is beneficial. He was started on Cymbalta about 3 weeks ago and feels this is helping some.   HISTORY: (copied from Dr SatGarth Bignesste on 01/06/2019)  Madeline Larsen a 32 55ar old female to female transgender man with polyarthralgia and myalgias.  Update 01/06/2019: He presented to the ED 12/28/18.  He felt fine in the morning and pain was doing much better since starting gabapentin with near resolution of the neuralgias.   While at work, he started feeling worse and coworkers noted the eyes looked bloodshot.   He felt confused and was taken to the ED.   BP was elevated at 180/100.   In the ED he had an MRI and was read as normal..  I personally reviewed and concur with the interpretation.   EEG was normal.     He was kept x 2 days and discharged.     The gabapentin was held.   The next day he was doing  better and he was discharged the following day on a lower dose of gabapentin 100-100-200.  He feels that dose is helping his pain as much as the higher dose.     Currently, he reports mild weakness in the left leg and notes a little stutter and slowing at times with speech.    Sleep is better at night than last visit.  From 12/16/18: He has a long history of pain in his legs and ankles and this has progressed over the last year..    He has seen Dr. DevEstanislado Pandy rheumatology.  Some of his rheumatologic tests have been abnormal but there is no definite diagnosis..  Marland KitchenHe notes his pain is worse in the joints than muscles,especially the knees.   He has mild shoulder pain but otherwise arms feel fine most of the time,  Dr. DevEstanislado Pandyaced him on hydroxychloroquine.  He takes 400 mg ibuprofen and acetaminophen 1000 mg every 4-6 hours.      He notes mild weakness in his thighs and calves.   Arms are strong.  He notes no change in bladder function.  He denies much back pain but he does report back pain.  There is no radiating pain from the back no numbness in the arms or  legs.  He is sleeping poorly at night.  Many nights he just gets 4 hours.   He snores some but has never been told there are pauses or gasps.   He denies depression.     I reviewed labs:   ESR, CBC, CMP; RF; HLAB27; CCP; Scl-70/ENA; C3/C4, Beta-2 glycoprotein; anti-cardiolipin; lupus anticoagulant; G6PDH, HepB, HepC were all negative or normal.     CK minimally elevated at 266; dsDNA Ab is indeterminate at 7; SSA (Ro) was positive at 8.0; TSH was mildly low at 0.31; uric acid mildly elevated at 7.8; ACE is mildly elevated at 87.  Xrays of knees show moderate osteoarthritis.      REVIEW OF SYSTEMS: Out of a complete 14 system review of symptoms, the patient complains only of the following symptoms, joint and muscle pain, dysesthesia and all other reviewed systems are negative.   ALLERGIES: No Known Allergies  HOME  MEDICATIONS: Outpatient Medications Prior to Visit  Medication Sig Dispense Refill  . amLODipine (NORVASC) 10 MG tablet Take 1 tablet (10 mg total) by mouth at bedtime. 90 tablet 1  . Caraway Oil-Levomenthol (FDGARD) 25-20.75 MG CAPS Take 2 capsules by mouth 2 (two) times daily. Take as directed, daily 12 capsule 0  . DULoxetine (CYMBALTA) 20 MG capsule Take 1 capsule (20 mg total) by mouth 2 (two) times daily. 60 capsule 5  . etodolac (LODINE) 400 MG tablet Take 1 tablet (400 mg total) by mouth 2 (two) times daily. 60 tablet 5  . gabapentin (NEURONTIN) 100 MG capsule One po qAM; one po qPM and two po qHS 120 capsule 5  . hydroxychloroquine (PLAQUENIL) 200 MG tablet TAKE 1 TABLET BY MOUTH TWICE A DAY (Patient taking differently: 200 mg. ) 180 tablet 0  . lubiprostone (AMITIZA) 8 MCG capsule Take 3 capsules (24 mcg total) by mouth daily. 90 capsule 5  . olmesartan (BENICAR) 5 MG tablet Take 2 tablets (10 mg total) by mouth every morning. 180 tablet 1  . ondansetron (ZOFRAN ODT) 8 MG disintegrating tablet Take 1 tablet (8 mg total) by mouth every 8 (eight) hours as needed for nausea or vomiting. 30 tablet 0  . testosterone cypionate (DEPOTESTOSTERONE CYPIONATE) 200 MG/ML injection Inject 0.4 mLs (80 mg total) into the muscle once a week. 10 mL 0  . pantoprazole (PROTONIX) 40 MG tablet TAKE 1 TABLET (40 MG TOTAL) BY MOUTH DAILY. FOR 8 WEEKS 90 tablet 1   No facility-administered medications prior to visit.    PAST MEDICAL HISTORY: Past Medical History:  Diagnosis Date  . Depression   . Female-to-female transgender person   . GERD (gastroesophageal reflux disease)   . MRSA (methicillin resistant staph aureus) culture positive     PAST SURGICAL HISTORY: Past Surgical History:  Procedure Laterality Date  . FOOT SURGERY Left   . top surgery  02/2018    FAMILY HISTORY: Family History  Problem Relation Age of Onset  . Alcohol abuse Mother   . Cancer Mother 28       Uterine cancer  . Drug  abuse Mother   . HIV/AIDS Mother   . Early death Mother 48       uterine cancer  . Alcohol abuse Father   . Drug abuse Father   . Polymyositis Father 44       deceased at age 56  . Early death Father 71       polymyositis  . Alcohol abuse Sister   . Depression Sister   . Drug  abuse Sister   . Depression Brother   . Kidney disease Maternal Grandmother        ESRD on dialysis  . Hypertension Maternal Grandmother   . Rheum arthritis Paternal Grandmother   . Rheum arthritis Brother   . Depression Sister   . Depression Sister   . Depression Sister   . Lupus Cousin   . Lupus Cousin   . Lupus Cousin   . Prostate cancer Paternal Uncle     SOCIAL HISTORY: Social History   Socioeconomic History  . Marital status: Married    Spouse name: Not on file  . Number of children: 2  . Years of education: Not on file  . Highest education level: High school graduate  Occupational History  . Not on file  Tobacco Use  . Smoking status: Never Smoker  . Smokeless tobacco: Never Used  Substance and Sexual Activity  . Alcohol use: Yes    Alcohol/week: 0.0 standard drinks    Comment: socially  . Drug use: Not Currently    Types: Marijuana  . Sexual activity: Yes    Birth control/protection: None    Comment: female partner  Other Topics Concern  . Not on file  Social History Narrative   Lives at home with wife & kids   Right handed   Social Determinants of Health   Financial Resource Strain:   . Difficulty of Paying Living Expenses: Not on file  Food Insecurity:   . Worried About Charity fundraiser in the Last Year: Not on file  . Ran Out of Food in the Last Year: Not on file  Transportation Needs:   . Lack of Transportation (Medical): Not on file  . Lack of Transportation (Non-Medical): Not on file  Physical Activity:   . Days of Exercise per Week: Not on file  . Minutes of Exercise per Session: Not on file  Stress:   . Feeling of Stress : Not on file  Social Connections:    . Frequency of Communication with Friends and Family: Not on file  . Frequency of Social Gatherings with Friends and Family: Not on file  . Attends Religious Services: Not on file  . Active Member of Clubs or Organizations: Not on file  . Attends Archivist Meetings: Not on file  . Marital Status: Not on file  Intimate Partner Violence:   . Fear of Current or Ex-Partner: Not on file  . Emotionally Abused: Not on file  . Physically Abused: Not on file  . Sexually Abused: Not on file      PHYSICAL EXAM  Vitals:   05/10/19 1247  BP: 129/85  Pulse: (!) 59  Temp: 97.9 F (36.6 C)  TempSrc: Oral  Weight: 232 lb 3.2 oz (105.3 kg)  Height: '5\' 2"'  (1.575 m)   Body mass index is 42.47 kg/m.  Generalized: Well developed, in no acute distress  Cardiology: normal rate and rhythm, no murmur noted Neurological examination  Mentation: Alert oriented to time, place, history taking. Follows all commands speech and language fluent Cranial nerve II-XII: Pupils were equal round reactive to light. Extraocular movements were full, visual field were full on confrontational test. Facial sensation and strength were normal. Uvula tongue midline. Head turning and shoulder shrug  were normal and symmetric. Motor: The motor testing reveals 5 over 5 strength of all 4 extremities. Good symmetric motor tone is noted throughout.  Sensory: Sensory testing is intact to soft touch on all 4 extremities. No evidence  of extinction is noted.  Coordination: Cerebellar testing reveals good finger-nose-finger and heel-to-shin bilaterally.  Gait and station: Gait is mildly wide with left leg dragging. Unable to tandem. Romberg is negative. No drift is seen.  Reflexes: Deep tendon reflexes are symmetric and normal bilaterally.   DIAGNOSTIC DATA (LABS, IMAGING, TESTING) - I reviewed patient records, labs, notes, testing and imaging myself where available.  No flowsheet data found.   Lab Results    Component Value Date   WBC 3.6 (L) 12/28/2018   HGB 13.6 12/28/2018   HCT 43.8 12/28/2018   MCV 89.0 12/28/2018   PLT 238 12/28/2018      Component Value Date/Time   NA 139 01/25/2019 1530   K 3.8 01/25/2019 1530   CL 104 01/25/2019 1530   CO2 26 01/25/2019 1530   GLUCOSE 143 (H) 01/25/2019 1530   BUN 9 01/25/2019 1530   CREATININE 0.82 01/25/2019 1530   CALCIUM 9.2 01/25/2019 1530   PROT 7.3 12/28/2018 1013   ALBUMIN 3.8 12/28/2018 1013   AST 23 12/28/2018 1013   ALT 15 12/28/2018 1013   ALKPHOS 50 12/28/2018 1013   BILITOT 0.4 12/28/2018 1013   GFRNONAA >60 12/28/2018 1013   GFRNONAA 85 12/02/2018 1019   GFRAA >60 12/28/2018 1013   GFRAA 98 12/02/2018 1019   Lab Results  Component Value Date   CHOL 148 05/04/2019   HDL 38.10 (L) 05/04/2019   LDLCALC 94 05/04/2019   TRIG 76.0 05/04/2019   CHOLHDL 4 05/04/2019   Lab Results  Component Value Date   HGBA1C 5.5 05/04/2019   Lab Results  Component Value Date   VITAMINB12 406 12/28/2018   Lab Results  Component Value Date   TSH 0.15 (L) 05/04/2019       ASSESSMENT AND PLAN 34 y.o. year old adult  has a past medical history of Depression, Female-to-female transgender person, GERD (gastroesophageal reflux disease), and MRSA (methicillin resistant staph aureus) culture positive. here with     ICD-10-CM   1. Left sided numbness  R20.0   2. Myalgia  M79.10   3. Gait disturbance  R26.9     Alvis Lemmings continues to have concerns of progressive joint and muscle pain and left-sided weakness.  Work-up thus far has been unremarkable.  Fortunately he does anticipate starting physical therapy next week.  We will increase gabapentin to 200 mg 3 times daily.  He will monitor very closely for any concerns of increased sedation.  Advised that he wait until he can be at home before he increases dosage.  He will continue activity as tolerated.  Fall precautions discussed.  He will continue close follow-up with primary care and  rheumatology.  He will follow-up with neurology in 3 to 6 months.  He verbalizes understanding and agreement with this plan.   No orders of the defined types were placed in this encounter.    No orders of the defined types were placed in this encounter.     I spent 15 minutes with the patient. 50% of this time was spent counseling and educating patient on plan of care and medications.    Debbora Presto, FNP-C 05/10/2019, 1:04 PM [facility] [address], [place], [place] [address] [phone]

## 2019-05-11 ENCOUNTER — Encounter: Payer: Self-pay | Admitting: Nurse Practitioner

## 2019-05-11 ENCOUNTER — Ambulatory Visit
Admission: RE | Admit: 2019-05-11 | Discharge: 2019-05-11 | Disposition: A | Discharge status: Home / Self Care | Payer: No Typology Code available for payment source | Source: Ambulatory Visit | Attending: Nurse Practitioner | Admitting: Nurse Practitioner

## 2019-05-11 DIAGNOSIS — M791 Myalgia, unspecified site: Secondary | ICD-10-CM

## 2019-05-11 DIAGNOSIS — E0590 Thyrotoxicosis, unspecified without thyrotoxic crisis or storm: Secondary | ICD-10-CM

## 2019-05-11 NOTE — Telephone Encounter (Signed)
F/u needed in order to refill

## 2019-05-12 ENCOUNTER — Encounter: Payer: Self-pay | Admitting: Family Medicine

## 2019-05-12 ENCOUNTER — Ambulatory Visit: Payer: No Typology Code available for payment source | Admitting: Cardiology

## 2019-05-24 ENCOUNTER — Encounter: Payer: Self-pay | Admitting: Physical Therapy

## 2019-05-24 ENCOUNTER — Other Ambulatory Visit: Payer: Self-pay

## 2019-05-24 ENCOUNTER — Ambulatory Visit: Payer: No Typology Code available for payment source | Attending: Nurse Practitioner | Admitting: Physical Therapy

## 2019-05-24 DIAGNOSIS — M25572 Pain in left ankle and joints of left foot: Secondary | ICD-10-CM | POA: Diagnosis present

## 2019-05-24 DIAGNOSIS — M25562 Pain in left knee: Secondary | ICD-10-CM | POA: Insufficient documentation

## 2019-05-24 DIAGNOSIS — M6281 Muscle weakness (generalized): Secondary | ICD-10-CM | POA: Diagnosis present

## 2019-05-24 DIAGNOSIS — R2681 Unsteadiness on feet: Secondary | ICD-10-CM | POA: Diagnosis present

## 2019-05-24 DIAGNOSIS — M25561 Pain in right knee: Secondary | ICD-10-CM | POA: Insufficient documentation

## 2019-05-24 DIAGNOSIS — M5440 Lumbago with sciatica, unspecified side: Secondary | ICD-10-CM | POA: Diagnosis present

## 2019-05-24 DIAGNOSIS — M25571 Pain in right ankle and joints of right foot: Secondary | ICD-10-CM | POA: Diagnosis present

## 2019-05-24 DIAGNOSIS — G8929 Other chronic pain: Secondary | ICD-10-CM

## 2019-05-24 DIAGNOSIS — R2689 Other abnormalities of gait and mobility: Secondary | ICD-10-CM | POA: Insufficient documentation

## 2019-05-24 NOTE — Therapy (Addendum)
[place], [place], [address] Phone: [phone]   Fax:  [phone]  Physical Therapy Evaluation  Patient Details  Name: Madeline Larsen MRN: [id number] Date of Birth: [birth year] Referring Provider (PT): Flossie Buffy NP   Encounter Date: 05/24/2019   PT End of Session -05-24-19 Visit Number                  1 Number of Visits          16 Date of PT Re evaluation   07-19-19 Authorization                                      Co Resource     Past Medical History:  Diagnosis Date  . Depression   . Female-to-female transgender person   . GERD (gastroesophageal reflux disease)   . MRSA (methicillin resistant staph aureus) culture positive     Past Surgical History:  Procedure Laterality Date  . FOOT SURGERY Left   . top surgery  02/2018    There were no vitals filed for this visit.   Subjective Assessment - 05/24/19 0815    Subjective  Pain everywhere.  I was in the hospital in Oct 2020 and I passed out at work. may have been on too high dose of Gabapentin. While I was hosptialized, my BP was high and now under control.  I am here at PT because I am in constant pain and I feel week. I havent been able to go back to gym since October. 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 I can sleep 2 hours but wake up all night. I have pain everywhere but mostly in my knees and ankles now. 7/10 and back 5/10    Pertinent History  GERD, OA, HTN +ANA, female to female transgender,    Limitations  Lifting;Walking;House hold activities   sleeping   How long can you sit comfortably?  unlimited    How long can you stand comfortably?  < 5 minutes    How long can you walk comfortably?  No more than 20 minutes    Diagnostic tests  MRI cervical Normal, mild degenerative changes    Patient Stated Goals  Pt goals is to get strength back and retrun to job to carry items     Currently in Pain?  Yes   Everywhere   Pain Score  7     Pain Location  Knee    Pain Orientation  Right;Left    Pain Descriptors / Indicators  Tingling;Aching;Sore    Pain Type  Chronic pain    Pain Onset  More than a month ago    Pain Frequency  Constant    Aggravating Factors   standing and walking too long    Pain Relieving Factors  lying down but I have to constantlly move    Multiple Pain Sites  Yes    Pain Score  5    Pain Location  Back    Pain Orientation  Right;Left;Mid;Lower    Pain Descriptors / Indicators  Sore    Pain Type  Chronic pain    Pain Onset  More than a month ago    Pain Frequency  Constant    Aggravating Factors   lifting things  Pain Score  7    Pain Location  Ankle    Pain Orientation  Right;Left    Pain Descriptors / Indicators  Tingling;Aching;Sore    Pain Type  Chronic pain    Pain Onset  More than a month ago    Pain Frequency  Constant    Aggravating Factors   standing and walking too long, cant stand in one spot for more than 5 minutes         [facility] PT Assessment - 05/24/19 0001      Assessment   Medical Diagnosis  Polyarthralgia myalgia, ataxia (gait disturbance)    Referring Provider (PT)  Nche, Bonna Gains NP    Onset Date/Surgical Date  12/28/18   for  past 5 years   Hand Dominance  Right      Precautions   Precautions  Fall    Precaution Comments  Upon d/c from hospital, MD had said not to drive   no falls since that 1 incident     Balance Screen   Has the patient fallen in the past 6 months  Yes    How many times?  1 fall upon DC from hospital , weakness    Has the patient had a decrease in activity level because of a fear of falling?   Yes    Is the patient reluctant to leave their home because of a fear of falling?   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    Leisure  hasnt worked out in gym since October      Cognition   Overall Cognitive Status  Within Functional Limits for tasks assessed      Observation/Other Assessments   Focus on Therapeutic Outcomes (FOTO)   NA      Sensation   Additional Comments  reports tingling in hands and feet, feels weaker on LT side and tend to veer toawrd RT on pt report      Functional Tests   Functional tests  Squat;Lunges;Single leg stance      Squat   Comments  1/2 squat and limited by pain wt bearing more on RT      Lunges   Comments  unable to lunge bil due to bil knee pain      Single Leg Stance   Comments  RT side 5 sec, LT 7 sec      Posture/Postural Control   Posture/Postural Control  Postural limitations    Postural Limitations  Rounded Shoulders;Forward head      ROM / Strength   AROM / PROM / Strength  AROM;Strength      AROM   Right Hip Flexion  90   limted by pain   Right Hip External Rotation  40    Right Hip Internal Rotation   20    Left Hip Flexion  90   limited by pain   Left Hip External Rotation   30    Left Hip Internal Rotation   20    Right Knee Extension  0    Right Knee Flexion  110   PROM 115 limited by pain   Left Knee Extension  0    Left Knee Flexion  100   106 limted by pain   Right Ankle Dorsiflexion  6    Left Ankle Dorsiflexion  11    Lumbar Flexion  75% finger tip to upper 1/3 shin    Lumbar Extension  50%    Pain  in mid back   Lumbar - Right Side Bend  50% to knee jt line    Lumbar - Left Side Bend  50% to knee jt line    Lumbar - Right Rotation  75%     Lumbar - Left Rotation  75%      Strength   Strength Assessment Site  Lumbar;Hip;Knee;Ankle    Right/Left Hip  Right;Left    Right Hip Flexion  3-/5    Right Hip Extension  4/5    Right Hip External Rotation   4/5    Right Hip Internal Rotation  4/5    Right Hip ABduction  3-/5    Left Hip Flexion  3-/5    Left Hip Extension  4-/5    Left Hip External Rotation  4-/5    Left Hip Internal Rotation  4-/5    Left Hip ABduction  3-/5    Right Knee Flexion  4/5    Right Knee Extension  3-/5    Left Knee Flexion  4-/5    Left Knee Extension  3-/5    Right Ankle Dorsiflexion  4-/5    Left Ankle Dorsiflexion  4-/5      Flexibility   Soft Tissue Assessment /Muscle Length  yes    Hamstrings  RT 60, LT 60      Palpation   Palpation comment  globally painful with any palpation, but TTP to bil lumbar paraspinals/gluteals and globally around knees               There EX,  Went over briefly exericise for HEP   Access Code: [email or web address]: [email or web address]: 03/16/2021Prepared by: Wayland Denis BeardsleyExercises  Supine Bridge - 1 x daily - 7 x weekly - 3 sets - 10 reps  Supine Active Straight Leg Raise - 1 x daily - 7 x weekly - 3 sets - 10 reps  Supine Single Knee to Chest Stretch - 1-2 x daily - 7 x weekly - 1 sets - 5 reps - 10 hold  Supine Hip Adductor Stretch - 1-2 x daily - 7 x weekly - 1 sets - 3 reps - 30-60 sec hold  Supine Piriformis Stretch with Leg Straight - 1-2 x daily - 7 x weekly - 1 sets - 3 reps - 30 hold   Objective measurements completed on examination: See above findings.              PT Education - 05/24/19 0843    Education Details  POC Explanation of findings  Initial HEP    Person(s) Educated  Patient    Methods  Explanation;Demonstration;Tactile cues;Verbal cues;Handout    Comprehension  Verbalized understanding;Returned demonstration       PT Short Term Goals - 05/24/19 1444  PT SHORT TERM GOAL #1   Title  Pt will be  independent with intial HEP    Time  4    Period  Weeks    Status  New    Target Date  06/21/19      PT SHORT TERM GOAL #2   Title  Pt will be able to flex bil knees to at least 120 degrees to be able to transfer with greater ease and decreased pain    Time  4    Period  Weeks    Status  New    Target Date  06/21/19      PT SHORT TERM GOAL #3   Title  Pt will be able to ascend steps with alternating steps and UE support with pain 5/10 or less    Time  4    Period  Weeks    Status  New    Target Date  06/21/19      PT SHORT TERM GOAL #4   Title  Pt will demonstrate squat with even wt distribution to show increased functional knee and ankle AROM    Time  4    Period  Weeks    Status  New    Target Date  06/21/19        PT Long Term Goals - 05/24/19 0844      PT LONG TERM GOAL #1   Title  Pt will be independent with  advanced HEP    Time  8    Period  Weeks    Status  New    Target Date  07/19/19      PT LONG TERM GOAL #2   Title  Pt will be able to ascend/descend steps with 3/10 pain and safety    Baseline  presently hold onto bil rails and pulls self up one at a time    Time  8    Period  Weeks    Status  New    Target Date  07/19/19      PT LONG TERM GOAL #3   Title  Pt will be able to lift 50 lb and walk 200 feet without exacerbating pain greater than 3/10 and with safety    Time  8    Period  Weeks    Status  New    Target Date  07/19/19      PT LONG TERM GOAL #4   Title  Pt will be able to sleep using positioning and pain management techniques to achieved 4-5 hours of restful continuous sleep    Baseline  unable to sleep more than 2 hours    Period  Weeks    Status  New    Target Date  07/19/19      PT LONG TERM GOAL #5   Title  Pt will be able to be consistent with exericise 4 x a week such home walking program    Time  8    Period  Weeks    Status  New    Target Date  07/19/19             Plan - 05/24/19 1433    Clinical Impression  Statement  34 yo female to female transgender person presents with increasing pain from polyarthragia myalgia with main complaints today in back/knees and ankles.  Pt with weakness in LT >RT and tends to bear wt to RT due to LT weakness. 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 Pt reports difficulty going up and down steps and is unable to squat with even wt distribution and use several compensations.  Pt has become deconditioned and would benefit from skilled PT in order to return to more pain managed life and increased strength.    Personal Factors and Comorbidities  Comorbidity 1;Comorbidity 2    Comorbidities  GERD, OA, HTN +ANA, obesity, female to female transgender, see chart for additional medical    Examination-Activity Limitations  Carry;Lift;Sleep;Locomotion Level;Stairs;Squat;Stand    Examination-Participation Restrictions  Cleaning;Laundry;Other   work lifting 50 to 70 lb for 200 feet   Stability/Clinical Decision Making  Evolving/Moderate complexity    Clinical Decision Making  Moderate    Rehab Potential  Good    PT Frequency  2x / week    PT Duration  8 weeks    PT Treatment/Interventions  Cryotherapy;Electrical Stimulation;Iontophoresis 4mg /ml Dexamethasone;Moist Heat;Ultrasound;Therapeutic exercise;Therapeutic activities;Functional mobility training;Stair training;Gait training;Neuromuscular re-education;Patient/family education;Passive range of motion;Manual techniques;Dry needling;Taping;Joint Manipulations    PT Next Visit Plan  review HEP and progress as needed ,  see if TPDN will help hip strength    PT Home Exercise Plan  [id number]    Consulted and Agree with Plan of Care  Patient          Patient will benefit from skilled therapeutic intervention in order to improve the following deficits and impairments:  Difficulty walking, Decreased mobility, Decreased range of motion, Decreased strength, Hypomobility, Postural  dysfunction, Improper body mechanics, Pain, Obesity  Visit Diagnosis: Bilateral low back pain with sciatica, sciatica laterality unspecified, unspecified chronicity  Chronic pain of left knee  Chronic pain of right knee  Pain in left ankle and joints of left foot  Pain in right ankle and joints of right foot  Muscle weakness (generalized)  Unsteadiness on feet     Problem List Patient Active Problem List   Diagnosis Date Noted  . S/P mastectomy, bilateral 05/04/2019  . Low TSH level 05/04/2019  . Hyperglycemia 05/04/2019  . Snoring 03/01/2019  . Essential hypertension 01/25/2019  . Chronic idiopathic constipation 01/04/2019  . GERD (gastroesophageal reflux disease) 12/28/2018  . Myalgia 12/16/2018  . Polyarthralgia 12/16/2018  . ANA positive 12/16/2018  . Neutropenia (HCC) 01/27/2018  . 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 Headache 06/18/2017  . 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 Status post left foot surgery 04/09/2015  . HAV (hallux abducto valgus) 09/05/2014    Garen LahLawrie Beardsley, PT Certified Exercise Expert for the Aging Adult  05/24/19 2:48 PM Phone: [phone] Fax: [phone]  [facility] [address] [place], [place], [address] Phone: [phone]   Fax:  [phone]  Name: Madeline Larsen MRN: [id number] Date of Birth: [birth year]

## 2019-05-24 NOTE — Patient Instructions (Addendum)
Access Code: [email or web address]: [email or web address]: 03/16/2021Prepared by: Wayland Denis BeardsleyExercises  Supine Bridge - 1 x daily - 7 x weekly - 3 sets - 10 reps  Supine Active Straight Leg Raise - 1 x daily - 7 x weekly - 3 sets - 10 reps  Supine Single Knee to Chest Stretch - 1-2 x daily - 7 x weekly - 1 sets - 5 reps - 10 hold  Supine Hip Adductor Stretch - 1-2 x daily - 7 x weekly - 1 sets - 3 reps - 30-60 sec hold  Supine Piriformis Stretch with Leg Straight - 1-2 x daily - 7 x weekly - 1 sets - 3 reps - 30 hold   [name], PT Certified Exercise Expert for the Aging Adult  05/24/19 8:43 AM Phone: [phone] Fax: [phone]

## 2019-05-26 ENCOUNTER — Other Ambulatory Visit: Payer: Self-pay

## 2019-05-26 ENCOUNTER — Ambulatory Visit (INDEPENDENT_AMBULATORY_CARE_PROVIDER_SITE_OTHER): Payer: No Typology Code available for payment source | Admitting: Endocrinology

## 2019-05-26 VITALS — BP 138/82 | HR 60 | Ht 62.0 in | Wt 223.4 lb

## 2019-05-26 DIAGNOSIS — F640 Transsexualism: Secondary | ICD-10-CM

## 2019-05-26 DIAGNOSIS — D709 Neutropenia, unspecified: Secondary | ICD-10-CM

## 2019-05-26 DIAGNOSIS — R7989 Other specified abnormal findings of blood chemistry: Secondary | ICD-10-CM | POA: Diagnosis not present

## 2019-05-26 DIAGNOSIS — D72819 Decreased white blood cell count, unspecified: Secondary | ICD-10-CM | POA: Insufficient documentation

## 2019-05-26 DIAGNOSIS — Z789 Other specified health status: Secondary | ICD-10-CM

## 2019-05-26 LAB — TSH: TSH: 0.35 u[IU]/mL (ref 0.35–4.50)

## 2019-05-26 LAB — CBC WITH DIFFERENTIAL/PLATELET
Basophils Absolute: 0 10*3/uL (ref 0.0–0.1)
Basophils Relative: 1.4 % (ref 0.0–3.0)
Eosinophils Absolute: 0 10*3/uL (ref 0.0–0.7)
Eosinophils Relative: 1.5 % (ref 0.0–5.0)
HCT: 45.1 % (ref 36.0–46.0)
Hemoglobin: 14.6 g/dL (ref 12.0–15.0)
Lymphocytes Relative: 45.1 % (ref 12.0–46.0)
Lymphs Abs: 1 10*3/uL (ref 0.7–4.0)
MCHC: 32.4 g/dL (ref 30.0–36.0)
MCV: 85.9 fl (ref 78.0–100.0)
Monocytes Absolute: 0.3 10*3/uL (ref 0.1–1.0)
Monocytes Relative: 14 % — ABNORMAL HIGH (ref 3.0–12.0)
Neutro Abs: 0.8 10*3/uL — ABNORMAL LOW (ref 1.4–7.7)
Neutrophils Relative %: 38 % — ABNORMAL LOW (ref 43.0–77.0)
Platelets: 213 10*3/uL (ref 150.0–400.0)
RBC: 5.25 Mil/uL — ABNORMAL HIGH (ref 3.87–5.11)
RDW: 12.3 % (ref 11.5–15.5)
WBC: 2.2 10*3/uL — ABNORMAL LOW (ref 4.0–10.5)

## 2019-05-26 LAB — T4, FREE: Free T4: 1.38 ng/dL (ref 0.60–1.60)

## 2019-05-26 NOTE — Patient Instructions (Signed)
Blood tests are requested for you today.  We'll let you know about the results.  Based on the results, I hope to be able to refill the testosterone. Also, if the thyroid is overactive again, I'll prescribe for you a pill to slow it down.   Please see a specialist.  you will receive a phone call, about a day and time for an appointment Please come back for a follow-up appointment in 6 weeks.

## 2019-05-26 NOTE — Progress Notes (Signed)
Subjective:    Patient ID: Madeline Larsen, adult    DOB: 04-10-1985, 34 y.o.   MRN: [id number]  HPI Pt returns for f/u of transgender state (F to M): Surgery: bilat mastectomies (Dr Festus Barren, in [place], [place]) in 2019 Medication: testosterone since 2015 Counseling: none recently Other: none Interval hx:  pt states he feels well in general.  He was noted on routine labs to have abnormal TSH.  No h/o thyroid problems.   Past Medical History:  Diagnosis Date  . Depression   . Female-to-female transgender person   . GERD (gastroesophageal reflux disease)   . MRSA (methicillin resistant staph aureus) culture positive     Past Surgical History:  Procedure Laterality Date  . FOOT SURGERY Left   . top surgery  02/2018    Social History   Socioeconomic History  . Marital status: Married    Spouse name: Not on file  . Number of children: 2  . Years of education: Not on file  . Highest education level: High school graduate  Occupational History  . Not on file  Tobacco Use  . Smoking status: Never Smoker  . Smokeless tobacco: Never Used  Substance and Sexual Activity  . Alcohol use: Yes    Alcohol/week: 0.0 standard drinks    Comment: socially  . Drug use: Not Currently    Types: Marijuana  . Sexual activity: Yes    Birth control/protection: None    Comment: female partner  Other Topics Concern  . Not on file  Social History Narrative   Lives at home with wife & kids   Right handed   Social Determinants of Health   Financial Resource Strain:   . Difficulty of Paying Living Expenses:   Food Insecurity:   . Worried About Programme researcher, broadcasting/film/video in the Last Year:   . Barista in the Last Year:   Transportation Needs:   . Freight forwarder (Medical):   Marland Kitchen Lack of Transportation (Non-Medical):   Physical Activity:   . Days of Exercise per Week:   . Minutes of Exercise per Session:   Stress:   . Feeling of Stress :   Social Connections:   . Frequency of  Communication with Friends and Family:   . Frequency of Social Gatherings with Friends and Family:   . Attends Religious Services:   . Active Member of Clubs or Organizations:   . Attends Banker Meetings:   Marland Kitchen Marital Status:   Intimate Partner Violence:   . Fear of Current or Ex-Partner:   . Emotionally Abused:   Marland Kitchen Physically Abused:   . Sexually Abused:     Current Outpatient Medications on File Prior to Visit  Medication Sig Dispense Refill  . amLODipine (NORVASC) 10 MG tablet Take 1 tablet (10 mg total) by mouth at bedtime. 90 tablet 1  . DULoxetine (CYMBALTA) 20 MG capsule Take 1 capsule (20 mg total) by mouth 2 (two) times daily. 60 capsule 5  . etodolac (LODINE) 400 MG tablet Take 1 tablet (400 mg total) by mouth 2 (two) times daily. 60 tablet 5  . gabapentin (NEURONTIN) 100 MG capsule Two capsules po qAM; two po qPM and two po qHS 180 capsule 5  . hydroxychloroquine (PLAQUENIL) 200 MG tablet TAKE 1 TABLET BY MOUTH TWICE A DAY (Patient taking differently: 200 mg. ) 180 tablet 0  . lubiprostone (AMITIZA) 8 MCG capsule Take 3 capsules (24 mcg total) by mouth daily. 90 capsule  5  . olmesartan (BENICAR) 5 MG tablet Take 2 tablets (10 mg total) by mouth every morning. 180 tablet 1  . ondansetron (ZOFRAN ODT) 8 MG disintegrating tablet Take 1 tablet (8 mg total) by mouth every 8 (eight) hours as needed for nausea or vomiting. 30 tablet 0  . testosterone cypionate (DEPOTESTOSTERONE CYPIONATE) 200 MG/ML injection Inject 0.4 mLs (80 mg total) into the muscle once a week. 10 mL 0   No current facility-administered medications on file prior to visit.    No Known Allergies  Family History  Problem Relation Age of Onset  . Alcohol abuse Mother   . Cancer Mother 72       Uterine cancer  . Drug abuse Mother   . HIV/AIDS Mother   . Early death Mother 51       uterine cancer  . Alcohol abuse Father   . Drug abuse Father   . Polymyositis Father 75       deceased at age 92    . Early death Father 7       polymyositis  . Alcohol abuse Sister   . Depression Sister   . Drug abuse Sister   . Depression Brother   . Kidney disease Maternal Grandmother        ESRD on dialysis  . Hypertension Maternal Grandmother   . Rheum arthritis Paternal Grandmother   . Rheum arthritis Brother   . Depression Sister   . Depression Sister   . Depression Sister   . Lupus Cousin   . Lupus Cousin   . Lupus Cousin   . Prostate cancer Paternal Uncle     BP 138/82   Pulse 60   Ht 5\' 2"  (1.575 m)   Wt 223 lb 6.4 oz (101.3 kg)   SpO2 99%   BMI 40.86 kg/m   Review of Systems Denies sob    Objective:   Physical Exam VITAL SIGNS:  See vs page GENERAL: no distress EXT: trace bilat leg edema.   Lab Results  Component Value Date   TESTOSTERONE 146 (H) 05/26/2019     Lab Results  Component Value Date   TSH 0.35 05/26/2019   [place]: normal.       Assessment & Plan:  Hyperthyroidism, new to me.  Resolved on recheck Transgender state: recheck today.   Patient Instructions  Blood tests are requested for you today.  We'll let you know about the results.  Based on the results, I hope to be able to refill the testosterone. Also, if the thyroid is overactive again, I'll prescribe for you a pill to slow it down.   Please see a specialist.  you will receive a phone call, about a day and time for an appointment Please come back for a follow-up appointment in 6 weeks.

## 2019-05-30 ENCOUNTER — Telehealth: Payer: Self-pay | Admitting: Hematology

## 2019-05-30 NOTE — Telephone Encounter (Signed)
Received a new hem referral from Dr. Everardo All for neutropenia. Madeline Larsen has been cld and scheduled to see Dr. Candise Che on 3/30 at 11am. Pt aware to arrive 15 minutes early.

## 2019-05-31 ENCOUNTER — Other Ambulatory Visit: Payer: Self-pay

## 2019-05-31 ENCOUNTER — Encounter: Payer: Self-pay | Admitting: Cardiology

## 2019-05-31 ENCOUNTER — Encounter: Payer: Self-pay | Admitting: Physical Therapy

## 2019-05-31 ENCOUNTER — Ambulatory Visit: Payer: No Typology Code available for payment source | Admitting: Physical Therapy

## 2019-05-31 DIAGNOSIS — M5440 Lumbago with sciatica, unspecified side: Secondary | ICD-10-CM | POA: Diagnosis not present

## 2019-05-31 DIAGNOSIS — M6281 Muscle weakness (generalized): Secondary | ICD-10-CM

## 2019-05-31 DIAGNOSIS — M25571 Pain in right ankle and joints of right foot: Secondary | ICD-10-CM

## 2019-05-31 DIAGNOSIS — R2681 Unsteadiness on feet: Secondary | ICD-10-CM

## 2019-05-31 DIAGNOSIS — G8929 Other chronic pain: Secondary | ICD-10-CM

## 2019-05-31 DIAGNOSIS — R2689 Other abnormalities of gait and mobility: Secondary | ICD-10-CM

## 2019-05-31 DIAGNOSIS — M25572 Pain in left ankle and joints of left foot: Secondary | ICD-10-CM

## 2019-05-31 LAB — TESTOSTERONE,FREE AND TOTAL
Testosterone, Free: 2 pg/mL (ref 0.0–4.2)
Testosterone: 146 ng/dL — ABNORMAL HIGH (ref 8–48)

## 2019-05-31 NOTE — Patient Instructions (Signed)
   [name], PT Certified Exercise Expert for the Aging Adult  05/31/19 10:33 AM Phone: [phone] Fax: [phone]

## 2019-05-31 NOTE — Therapy (Signed)
[facility] [address] [place], [place], [address] Phone: [phone]   Fax:  [phone]  Physical Therapy Treatment  Patient Details  Name: Madeline Larsen MRN: [id number] Date of Birth: [birth year] Referring Provider (PT): Anne Ng NP   Encounter Date: 05/31/2019  PT End of Session - 05/31/19 1329    Visit Number  2    Number of Visits  16    Date for PT Re-Evaluation  07/19/19    Authorization Type  [employer] Multi plan 2021 coresource    PT Start Time  1016    PT Stop Time  1100    PT Time Calculation (min)  44 min    Activity Tolerance  Patient tolerated treatment well    Behavior During Therapy  [facility] for tasks assessed/performed       Past Medical History:  Diagnosis Date  . Depression   . Female-to-female transgender person   . GERD (gastroesophageal reflux disease)   . MRSA (methicillin resistant staph aureus) culture positive     Past Surgical History:  Procedure Laterality Date  . FOOT SURGERY Left   . top surgery  02/2018    There were no vitals filed for this visit.  Subjective Assessment - 05/31/19 1025    Subjective  More pain in my RT hip today  6/10    Pertinent History  GERD, OA, HTN +ANA, female to female transgender,    Limitations  Lifting;Walking;House hold activities    Patient Stated Goals  Pt goals is to get strength back and retrun to job to carry items    Currently in Pain?  Yes    Pain Score  6     Pain Location  Knee    Pain Orientation  Right;Left    Pain Descriptors / Indicators  Tingling;Aching    Pain Type  Chronic pain    Pain Onset  More than a month ago    Pain Frequency  Constant    Pain Score  6    Pain Location  Back    Pain Orientation  Right;Left    Pain Descriptors / Indicators  Sore;Aching    Pain Type  Chronic pain    Pain Radiating Towards  radiating in RT hip today    Pain Onset  More than a month ago    Pain Frequency  Constant    Pain Score  6    Pain  Location  Ankle    Pain Orientation  Right;Left    Pain Descriptors / Indicators  Aching;Tingling                       [facility] Adult PT Treatment/Exercise - 05/31/19 1327      Self-Care   Self-Care  Lifting    Lifting  educating on proper hip hinge and DL #45 KB 2 x 8 and 1 x 8 with 30# KB  2 x 10 1 set without green t band avoe knees and one set with t band  x 10 which felt better with external cue   farmers lunge and carry using 15 lb in RT hand and 25 lb in LT hand for 2 times of 80 feet  10 x funge ith RT leg foraward and 10 x lunge with LT leg forward to pick up both KB  ankle stretch/calf against wall 3 x 30 on LT foot      Exercises   Other Exercises   see Self  care column for particular exericiises.  Pt was educated on proper technique for each exericise and injury prevention       Lumbar Exercises: Standing   Other Standing Lumbar Exercises  3x 10 hip hinge with dowel road and VC and TC, 2 x 10 hip hinge with ext cue against wall  DL with 25 # KB on 8 inch step             PT Education - 05/31/19 1326    Education Details  added essential strength with weights to HEP and educated on how to lift with proper posture/body Administrator) Educated  Patient    Methods  Explanation;Demonstration;Tactile cues;Verbal cues;Handout    Comprehension  Verbalized understanding;Returned demonstration       PT Short Term Goals - 05/24/19 1444      PT SHORT TERM GOAL #1   Title  Pt will be independent with intial HEP    Time  4    Period  Weeks    Status  New    Target Date  06/21/19      PT SHORT TERM GOAL #2   Title  Pt will be able to flex bil knees to at least 120 degrees to be able to transfer with greater ease and decreased pain    Time  4    Period  Weeks    Status  New    Target Date  06/21/19      PT SHORT TERM GOAL #3   Title  Pt will be able to ascend steps with alternating steps and UE support with pain 5/10 or less    Time  4    Period   Weeks    Status  New    Target Date  06/21/19      PT SHORT TERM GOAL #4   Title  Pt will demonstrate squat with even wt distribution to show increased functional knee and ankle AROM    Time  4    Period  Weeks    Status  New    Target Date  06/21/19        PT Long Term Goals - 05/24/19 0844      PT LONG TERM GOAL #1   Title  Pt will be independent with  advanced HEP    Time  8    Period  Weeks    Status  New    Target Date  07/19/19      PT LONG TERM GOAL #2   Title  Pt will be able to ascend/descend steps with 3/10 pain and safety    Baseline  presently hold onto bil rails and pulls self up one at a time    Time  8    Period  Weeks    Status  New    Target Date  07/19/19      PT LONG TERM GOAL #3   Title  Pt will be able to lift 50 lb and walk 200 feet without exacerbating pain greater than 3/10 and with safety    Time  8    Period  Weeks    Status  New    Target Date  07/19/19      PT LONG TERM GOAL #4   Title  Pt will be able to sleep using positioning and pain management techniques to achieved 4-5 hours of restful continuous sleep    Baseline  unable to sleep more than 2 hours  Period  Weeks    Status  New    Target Date  07/19/19      PT LONG TERM GOAL #5   Title  Pt will be able to be consistent with exericise 4 x a week such home walking program    Time  8    Period  Weeks    Status  New    Target Date  07/19/19            Plan - 05/31/19 1330    Clinical Impression Statement  [name] returns for strengthening HEP and is able to perform with VC and TC many return demos.  Pt reports 6/10 pain but is able to perform all exericises asked.  Pt has decreased LT toe extension which effects ability to be able to lunge properly and effects knees.  Pt knee pain modifited by using Green t band with squats.  Will continue to progress with HEP as pt able to tolerate.    Personal Factors and Comorbidities  Comorbidity 1;Comorbidity 2    Comorbidities   GERD, OA, HTN +ANA, obesity, female to female transgender, see chart for additional medical    Examination-Activity Limitations  Carry;Lift;Sleep;Locomotion Level;Stairs;Squat;Stand    Examination-Participation Restrictions  Cleaning;Laundry;Other    PT Treatment/Interventions  Cryotherapy;Electrical Stimulation;Iontophoresis 4mg /ml Dexamethasone;Moist Heat;Ultrasound;Therapeutic exercise;Therapeutic activities;Functional mobility training;Stair training;Gait training;Neuromuscular re-education;Patient/family education;Passive range of motion;Manual techniques;Dry needling;Taping;Joint Manipulations    PT Next Visit Plan  review HEP with KB  DL, hip hinge and farmers carry.  He must be able to lift 50 to 70 #   Injury prevention.  ADL/posture/ body mechnanic sheet    PT Home Exercise Plan  ,  [email or web address]    Consulted and Agree with Plan of Care  Patient       Patient will benefit from skilled therapeutic intervention in order to improve the following deficits and impairments:  Difficulty walking, Decreased mobility, Decreased range of motion, Decreased strength, Hypomobility, Postural dysfunction, Improper body mechanics, Pain, Obesity  Visit Diagnosis: Bilateral low back pain with sciatica, sciatica laterality unspecified, unspecified chronicity  Chronic pain of left knee  Chronic pain of right knee  Pain in left ankle and joints of left foot  Pain in right ankle and joints of right foot  Muscle weakness (generalized)  Unsteadiness on feet  Other abnormalities of gait and mobility  Access Code: [email or web address]: [email or web address]: 03/23/2021Prepared by: 06/02/2019 BeardsleyExercises  Standing Hip Hinge with Dowel - 1 x daily - 7 x weekly - 10 reps - 3 sets  Goblet Squat with Kettlebell - 1 x daily - 7 x weekly - 10 reps - 3 sets  Kettlebell Deadlift - 1 x daily - 7 x weekly - 4 sets - 5 reps  Farmer's Carry with Kettlebells - 1 x daily - 7 x weekly - 2 sets - 8  reps    Problem List Patient Active Problem List   Diagnosis Date Noted  . Leukopenia 05/26/2019  . S/P mastectomy, bilateral 05/04/2019  . Low TSH level 05/04/2019  . Hyperglycemia 05/04/2019  . Snoring 03/01/2019  . Essential hypertension 01/25/2019  . Chronic idiopathic constipation 01/04/2019  . GERD (gastroesophageal reflux disease) 12/28/2018  . Myalgia 12/16/2018  . Polyarthralgia 12/16/2018  . ANA positive 12/16/2018  . Neutropenia (HCC) 01/27/2018  . 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 Headache 06/18/2017  . 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 Status post left foot surgery 04/09/2015  . HAV (hallux abducto valgus) 09/05/2014   09/07/2014, PT Certified Exercise  Expert for the Aging Adult  05/31/19 4:12 PM Phone: [phone] Fax: [facility] [address] [place], [place], [address] Phone: [phone]   Fax:  [phone]  Name: Madeline Larsen MRN: [id number] Date of Birth: 09/06/1985

## 2019-06-02 LAB — ESTRADIOL, FREE
Estradiol, Free: 0.82 pg/mL
Estradiol: 43 pg/mL

## 2019-06-06 NOTE — Progress Notes (Signed)
HEMATOLOGY/ONCOLOGY CONSULTATION NOTE  Date of Service: 06/07/2019  Patient Care Team: Nche, Bonna Gains, NP as PCP - General (Internal Medicine)  CHIEF COMPLAINTS/PURPOSE OF CONSULTATION:  Neutropenia  HISTORY OF PRESENTING ILLNESS:   Madeline Larsen is a wonderful 34 y.o. adult who has been referred to [place] by Dr Everardo All for evaluation and management of neutropenia. Pt is accompanied today by his wife, via phone. The pt reports that he is doing well overall.   The pt reports that he was first told that he had low neutrophil counts in 2010, but no test were given to find the cause. He denies frequent infections as a child or any significant infections recently. Pt has been following with endocrinologist, Dr. Everardo All, who noted that his thyroid function was low in mid-February, but has normalized since then. Dr. Everardo All is also managing pt's Testosterone therapy. Pt started taking biotin vitamins last week, but this was after his last thyroid function testing. His weight has been up and down but he has recently been working to lose weight.   He has a lot of joint pain and muscle pain. The pain has become more severe over the last few months. Pt was placed on Plaquenil by Dr. Corliss Skains for positive ANA, but he was also found to be negative for Lupus. Pt last saw Dr. Corliss Skains in July of 2020. He notes that sometimes the Plaquenil helps, but he can't tell a major difference overall. Pt was also seen by a Neurologist who started him on Etodolac in September/October. He was started on Gabapentin in February. Pt has three first cousins who have Lupus on his father's side of the family.   Pt no longer drinks alcohol or smokes marijuana. He has no known chemical exposure at work. Pt's father had polymyositis.   Most recent lab results (05/26/2019) of CBC w/diff is as follows: all values are WNL except for WBC at 2.2K, RBC at 5.25, Neutro Rel at 38.0, Mono Rel at 14.0, Neutro Abs at 0.8K.  On  review of systems, pt reports tingling/numbness in hands/feet, joint pain, muscle pain and denies unexpected weight loss, fevers, chills, night sweats, new lumps/bumps, bowel habit changes and any other symptoms.   On PMHx the pt reports Depression, Female-to-make transition, Neutropenia. On Social Hx the pt reports that he does not drink alcohol or smoke marijuana. On Family Hx the pt reports that his father had Polymyositis and he has three cousins with Lupus.   MEDICAL HISTORY:  Past Medical History:  Diagnosis Date  . Depression   . Female-to-female transgender person   . GERD (gastroesophageal reflux disease)   . MRSA (methicillin resistant staph aureus) culture positive     SURGICAL HISTORY: Past Surgical History:  Procedure Laterality Date  . FOOT SURGERY Left   . top surgery  02/2018    SOCIAL HISTORY: Social History   Socioeconomic History  . Marital status: Married    Spouse name: Not on file  . Number of children: 2  . Years of education: Not on file  . Highest education level: High school graduate  Occupational History  . Not on file  Tobacco Use  . Smoking status: Never Smoker  . Smokeless tobacco: Never Used  Substance and Sexual Activity  . Alcohol use: Yes    Alcohol/week: 0.0 standard drinks    Comment: socially  . Drug use: Not Currently    Types: Marijuana  . Sexual activity: Yes    Birth control/protection: None  Comment: female partner  Other Topics Concern  . Not on file  Social History Narrative   Lives at home with wife & kids   Right handed   Social Determinants of Health   Financial Resource Strain:   . Difficulty of Paying Living Expenses:   Food Insecurity:   . Worried About Programme researcher, broadcasting/film/video in the Last Year:   . Barista in the Last Year:   Transportation Needs:   . Freight forwarder (Medical):   Marland Kitchen Lack of Transportation (Non-Medical):   Physical Activity:   . Days of Exercise per Week:   . Minutes of Exercise  per Session:   Stress:   . Feeling of Stress :   Social Connections:   . Frequency of Communication with Friends and Family:   . Frequency of Social Gatherings with Friends and Family:   . Attends Religious Services:   . Active Member of Clubs or Organizations:   . Attends Banker Meetings:   Marland Kitchen Marital Status:   Intimate Partner Violence:   . Fear of Current or Ex-Partner:   . Emotionally Abused:   Marland Kitchen Physically Abused:   . Sexually Abused:     FAMILY HISTORY: Family History  Problem Relation Age of Onset  . Alcohol abuse Mother   . Cancer Mother 61       Uterine cancer  . Drug abuse Mother   . HIV/AIDS Mother   . Early death Mother 14       uterine cancer  . Alcohol abuse Father   . Drug abuse Father   . Polymyositis Father 19       deceased at age 66  . Early death Father 51       polymyositis  . Alcohol abuse Sister   . Depression Sister   . Drug abuse Sister   . Depression Brother   . Kidney disease Maternal Grandmother        ESRD on dialysis  . Hypertension Maternal Grandmother   . Rheum arthritis Paternal Grandmother   . Rheum arthritis Brother   . Depression Sister   . Depression Sister   . Depression Sister   . Lupus Cousin   . Lupus Cousin   . Lupus Cousin   . Prostate cancer Paternal Uncle     ALLERGIES:  has No Known Allergies.  MEDICATIONS:  Current Outpatient Medications  Medication Sig Dispense Refill  . amLODipine (NORVASC) 10 MG tablet Take 1 tablet (10 mg total) by mouth at bedtime. 90 tablet 1  . DULoxetine (CYMBALTA) 20 MG capsule Take 1 capsule (20 mg total) by mouth 2 (two) times daily. 60 capsule 5  . etodolac (LODINE) 400 MG tablet Take 1 tablet (400 mg total) by mouth 2 (two) times daily. 60 tablet 5  . gabapentin (NEURONTIN) 100 MG capsule Two capsules po qAM; two po qPM and two po qHS 180 capsule 5  . hydroxychloroquine (PLAQUENIL) 200 MG tablet TAKE 1 TABLET BY MOUTH TWICE A DAY (Patient taking differently: 200 mg. )  180 tablet 0  . lubiprostone (AMITIZA) 8 MCG capsule Take 3 capsules (24 mcg total) by mouth daily. 90 capsule 5  . olmesartan (BENICAR) 5 MG tablet Take 2 tablets (10 mg total) by mouth every morning. 180 tablet 1  . ondansetron (ZOFRAN ODT) 8 MG disintegrating tablet Take 1 tablet (8 mg total) by mouth every 8 (eight) hours as needed for nausea or vomiting. 30 tablet 0  . testosterone  cypionate (DEPOTESTOSTERONE CYPIONATE) 200 MG/ML injection Inject 0.4 mLs (80 mg total) into the muscle once a week. 10 mL 0   No current facility-administered medications for this visit.    REVIEW OF SYSTEMS:    10 Point review of Systems was done is negative except as noted above.  PHYSICAL EXAMINATION: ECOG PERFORMANCE STATUS: 1 - Symptomatic but completely ambulatory  . Vitals:   06/07/19 1141  BP: (!) 146/94  Pulse: 60  Resp: 18  Temp: 98.2 F (36.8 C)  SpO2: 100%   Filed Weights   06/07/19 1141  Weight: 220 lb 4.8 oz (99.9 kg)   .Body mass index is 40.29 kg/m.  GENERAL:alert, in no acute distress and comfortable SKIN: no acute rashes, no significant lesions EYES: conjunctiva are pink and non-injected, sclera anicteric OROPHARYNX: MMM, no exudates, no oropharyngeal erythema or ulceration NECK: supple, no JVD LYMPH:  no palpable lymphadenopathy in the cervical, axillary or inguinal regions LUNGS: clear to auscultation b/l with normal respiratory effort HEART: regular rate & rhythm ABDOMEN:  normoactive bowel sounds , non tender, not distended. Extremity: no pedal edema PSYCH: alert & oriented x 3 with fluent speech NEURO: no focal motor/sensory deficits  LABORATORY DATA:  I have reviewed the data as listed  . CBC Latest Ref Rng & Units 06/07/2019 05/26/2019 12/28/2018  WBC 4.0 - 10.5 K/uL 2.9(L) 2.2 Repeated and verified X2.(L) 3.6(L)  Hemoglobin 12.0 - 15.0 g/dL 14.2 14.6 13.6  Hematocrit 36.0 - 46.0 % 44.9 45.1 43.8  Platelets 150 - 400 K/uL 249 213.0 238    . CMP Latest  Ref Rng & Units 06/07/2019 01/25/2019 12/28/2018  Glucose 70 - 99 mg/dL 81 143(H) 96  BUN 6 - 20 mg/dL 10 9 7   Creatinine 0.44 - 1.00 mg/dL 0.90 0.82 0.88  Sodium 135 - 145 mmol/L 137 139 138  Potassium 3.5 - 5.1 mmol/L 4.3 3.8 4.0  Chloride 98 - 111 mmol/L 103 104 104  CO2 22 - 32 mmol/L 26 26 24   Calcium 8.9 - 10.3 mg/dL 9.6 9.2 9.1  Total Protein 6.5 - 8.1 g/dL 8.1 - 7.3  Total Bilirubin 0.3 - 1.2 mg/dL 0.7 - 0.4  Alkaline Phos 38 - 126 U/L 63 - 50  AST 15 - 41 U/L 31 - 23  ALT 0 - 44 U/L 25 - 15     RADIOGRAPHIC STUDIES: I have personally reviewed the radiological images as listed and agreed with the findings in the report. US THYROID  Result Date: 05/11/2019 CLINICAL DATA:  Abnormal TSH with myalgias. EXAM: THYROID ULTRASOUND TECHNIQUE: Ultrasound examination of the thyroid gland and adjacent soft tissues was performed. COMPARISON:  None. FINDINGS: Parenchymal Echotexture: Normal Isthmus: 0.3 cm Right lobe: 4.6 x 1.4 x 2 cm Left lobe: 4.4 x 1.2 x 1.8 cm _________________________________________________________ Estimated total number of nodules >/= 1 cm: 0 Number of spongiform nodules >/=  2 cm not described below (TR1): 0 Number of mixed cystic and solid nodules >/= 1.5 cm not described below (TR2): 0 _________________________________________________________ No discrete nodules are seen within the thyroid gland. No enlarged lymph nodes are noted. IMPRESSION: Normal-sized thyroid gland without evidence for distinct thyroid nodule. The above is in keeping with the ACR TI-RADS recommendations - J Am Coll Radiol 2017;14:587-595. Electronically Signed   By: Constance Holster M.D.   On: 05/11/2019 18:55    ASSESSMENT & PLAN:  34 yo with possible autoimmune inflammatory condition ? Lupus with   1) Neutropenia - chronic ?autoimmune vs Benign ethnic neutropenia  PLAN: -Discussed patient's most recent labs from 05/26/2019, all values are WNL except for WBC at 2.2K, RBC at 5.25, Neutro Rel at  38.0, Mono Rel at 14.0, Neutro Abs at 0.8K. -Advised pt that it is possible that he has: Autoimmune Neutropenia, Benign Ethnic Neutropenia, or Neutropenia from medication effect -If pt had additional cytopenias would be concerned for a primary bone marrow problem -Advised pt that due to the length of time that he has been neutropenic, it is less concerning for quickly-moving malignant process -Advised pt that intermittent thyroid inflammation could be caused by an autoimmune process -Recommend pt f/u with Dr. Corliss Skainseveshwar for Plaquenil management -Will get labs today  -Will see back in 6 months with labs    FOLLOW UP: Labs today RTC with Dr Candise CheKale with labs in 6 months  . Orders Placed This Encounter  Procedures  . CBC with Differential/Platelet    Standing Status:   Future    Number of Occurrences:   1    Standing Expiration Date:   07/11/2020  . CMP (Cancer Center only)    Standing Status:   Future    Number of Occurrences:   1    Standing Expiration Date:   06/06/2020  . Sedimentation rate    Standing Status:   Future    Number of Occurrences:   1    Standing Expiration Date:   06/06/2020  . Lactate dehydrogenase    Standing Status:   Future    Number of Occurrences:   1    Standing Expiration Date:   06/06/2020  . Vitamin B12    Standing Status:   Future    Number of Occurrences:   1    Standing Expiration Date:   06/06/2020  . Folate RBC    Standing Status:   Future    Number of Occurrences:   1    Standing Expiration Date:   06/06/2020  . CMP (Cancer Center only)    Standing Status:   Future    Standing Expiration Date:   06/06/2020  . CBC with Differential/Platelet    Standing Status:   Future    Standing Expiration Date:   07/11/2020     All of the patients questions were answered with apparent satisfaction. The patient knows to call the clinic with any problems, questions or concerns.  I spent 30 mins counseling the patient face to face. The total time spent in the  appointment was 45 minutes and more than 50% was on counseling and direct patient cares.    Wyvonnia LoraGautam Jassica Zazueta MD MS AAHIVMS [facility] Hematology/Oncology Physician [facility]  (Office):       [phone] (Work cell):  [phone] (Fax):           [phone]  06/07/2019 4:29 PM  I, Carollee HerterJazzmine Knight, am acting as a scribe for Dr. Wyvonnia LoraGautam Kaydince Towles.   .I have reviewed the above documentation for accuracy and completeness, and I agree with the above. Johney Maine.Tallia Moehring Kishore Eyad Rochford MD   ADDENDUM    Component     Latest Ref Rng & Units 06/07/2019        12:31 PM  WBC     4.0 - 10.5 K/uL 2.9 (L)  RBC     3.87 - 5.11 MIL/uL 5.17 (H)  Hemoglobin     12.0 - 15.0 g/dL 57.814.2  HCT     46.934.0 - 62.946.6 % 44.9  MCV     80.0 - 100.0 fL 86.8  MCH  26.0 - 34.0 pg 27.5  MCHC     30.0 - 36.0 g/dL 20.2  RDW     33.4 - 35.6 % 11.9  Platelets     150 - 400 K/uL 249  nRBC     0.0 - 0.2 % 0.0  Neutrophils     % 43  NEUT#     1.7 - 7.7 K/uL 1.3 (L)  Lymphocytes     % 43  Lymphocyte #     0.7 - 4.0 K/uL 1.2  Monocytes Relative     % 11  Monocyte #     0.1 - 1.0 K/uL 0.3  Eosinophil     % 2  Eosinophils Absolute     0.0 - 0.5 K/uL 0.1  Basophil     % 1  Basophils Absolute     0.0 - 0.1 K/uL 0.0  Immature Granulocytes     % 0  Abs Immature Granulocytes     0.00 - 0.07 K/uL 0.00  Sodium     135 - 145 mmol/L 137  Potassium     3.5 - 5.1 mmol/L 4.3  Chloride     98 - 111 mmol/L 103  CO2     22 - 32 mmol/L 26  Glucose     70 - 99 mg/dL 81  BUN     6 - 20 mg/dL 10  Creatinine     8.61 - 1.00 mg/dL 6.83  Calcium     8.9 - 10.3 mg/dL 9.6  Total Protein     6.5 - 8.1 g/dL 8.1  Albumin     3.5 - 5.0 g/dL 4.0  AST     15 - 41 U/L 31  ALT     0 - 44 U/L 25  Alkaline Phosphatase     38 - 126 U/L 63  Total Bilirubin     0.3 - 1.2 mg/dL 0.7  GFR, Est Non African American     >60 mL/min >60  GFR, Est African American     >60 mL/min >60  Anion gap     5 - 15 8  Folate,  Hemolysate     Not Estab. ng/mL   Folate, RBC     >498 ng/mL   Sed Rate     0 - 22 mm/hr 8  LDH     98 - 192 U/L 218 (H)  Vitamin B12     180 - 914 pg/mL 357    Leucopenia and neutropenia improved to 2.9k and ANC1.3k (up from 800)

## 2019-06-07 ENCOUNTER — Inpatient Hospital Stay: Payer: PRIVATE HEALTH INSURANCE

## 2019-06-07 ENCOUNTER — Inpatient Hospital Stay: Payer: PRIVATE HEALTH INSURANCE | Attending: Hematology | Admitting: Hematology

## 2019-06-07 ENCOUNTER — Other Ambulatory Visit: Payer: Self-pay

## 2019-06-07 VITALS — BP 146/94 | HR 60 | Temp 98.2°F | Resp 18 | Ht 62.0 in | Wt 220.3 lb

## 2019-06-07 DIAGNOSIS — Z79899 Other long term (current) drug therapy: Secondary | ICD-10-CM | POA: Insufficient documentation

## 2019-06-07 DIAGNOSIS — Z8049 Family history of malignant neoplasm of other genital organs: Secondary | ICD-10-CM | POA: Insufficient documentation

## 2019-06-07 DIAGNOSIS — D708 Other neutropenia: Secondary | ICD-10-CM | POA: Diagnosis not present

## 2019-06-07 DIAGNOSIS — Z8261 Family history of arthritis: Secondary | ICD-10-CM | POA: Insufficient documentation

## 2019-06-07 DIAGNOSIS — K219 Gastro-esophageal reflux disease without esophagitis: Secondary | ICD-10-CM | POA: Insufficient documentation

## 2019-06-07 DIAGNOSIS — Z8042 Family history of malignant neoplasm of prostate: Secondary | ICD-10-CM | POA: Insufficient documentation

## 2019-06-07 DIAGNOSIS — F329 Major depressive disorder, single episode, unspecified: Secondary | ICD-10-CM | POA: Diagnosis not present

## 2019-06-07 DIAGNOSIS — D709 Neutropenia, unspecified: Secondary | ICD-10-CM | POA: Insufficient documentation

## 2019-06-07 DIAGNOSIS — Z8249 Family history of ischemic heart disease and other diseases of the circulatory system: Secondary | ICD-10-CM | POA: Diagnosis not present

## 2019-06-07 LAB — CBC WITH DIFFERENTIAL/PLATELET
Abs Immature Granulocytes: 0 10*3/uL (ref 0.00–0.07)
Basophils Absolute: 0 10*3/uL (ref 0.0–0.1)
Basophils Relative: 1 %
Eosinophils Absolute: 0.1 10*3/uL (ref 0.0–0.5)
Eosinophils Relative: 2 %
HCT: 44.9 % (ref 36.0–46.0)
Hemoglobin: 14.2 g/dL (ref 12.0–15.0)
Immature Granulocytes: 0 %
Lymphocytes Relative: 43 %
Lymphs Abs: 1.2 10*3/uL (ref 0.7–4.0)
MCH: 27.5 pg (ref 26.0–34.0)
MCHC: 31.6 g/dL (ref 30.0–36.0)
MCV: 86.8 fL (ref 80.0–100.0)
Monocytes Absolute: 0.3 10*3/uL (ref 0.1–1.0)
Monocytes Relative: 11 %
Neutro Abs: 1.3 10*3/uL — ABNORMAL LOW (ref 1.7–7.7)
Neutrophils Relative %: 43 %
Platelets: 249 10*3/uL (ref 150–400)
RBC: 5.17 MIL/uL — ABNORMAL HIGH (ref 3.87–5.11)
RDW: 11.9 % (ref 11.5–15.5)
WBC: 2.9 10*3/uL — ABNORMAL LOW (ref 4.0–10.5)
nRBC: 0 % (ref 0.0–0.2)

## 2019-06-07 LAB — CMP (CANCER CENTER ONLY)
ALT: 25 U/L (ref 0–44)
AST: 31 U/L (ref 15–41)
Albumin: 4 g/dL (ref 3.5–5.0)
Alkaline Phosphatase: 63 U/L (ref 38–126)
Anion gap: 8 (ref 5–15)
BUN: 10 mg/dL (ref 6–20)
CO2: 26 mmol/L (ref 22–32)
Calcium: 9.6 mg/dL (ref 8.9–10.3)
Chloride: 103 mmol/L (ref 98–111)
Creatinine: 0.9 mg/dL (ref 0.44–1.00)
GFR, Est AFR Am: >60 mL/min (ref >60)
GFR, Estimated: >60 mL/min (ref >60)
Glucose, Bld: 81 mg/dL (ref 70–99)
Potassium: 4.3 mmol/L (ref 3.5–5.1)
Sodium: 137 mmol/L (ref 135–145)
Total Bilirubin: 0.7 mg/dL (ref 0.3–1.2)
Total Protein: 8.1 g/dL (ref 6.5–8.1)

## 2019-06-07 LAB — LACTATE DEHYDROGENASE: LDH: 218 U/L — ABNORMAL HIGH (ref 98–192)

## 2019-06-07 LAB — SEDIMENTATION RATE: Sed Rate: 8 mm/hr (ref 0–22)

## 2019-06-07 LAB — VITAMIN B12: Vitamin B-12: 357 pg/mL (ref 180–914)

## 2019-06-08 ENCOUNTER — Ambulatory Visit: Payer: No Typology Code available for payment source

## 2019-06-08 DIAGNOSIS — M6281 Muscle weakness (generalized): Secondary | ICD-10-CM

## 2019-06-08 DIAGNOSIS — G8929 Other chronic pain: Secondary | ICD-10-CM

## 2019-06-08 DIAGNOSIS — M25561 Pain in right knee: Secondary | ICD-10-CM

## 2019-06-08 DIAGNOSIS — M5440 Lumbago with sciatica, unspecified side: Secondary | ICD-10-CM | POA: Diagnosis not present

## 2019-06-08 DIAGNOSIS — M25571 Pain in right ankle and joints of right foot: Secondary | ICD-10-CM

## 2019-06-08 DIAGNOSIS — R2681 Unsteadiness on feet: Secondary | ICD-10-CM

## 2019-06-08 DIAGNOSIS — M25572 Pain in left ankle and joints of left foot: Secondary | ICD-10-CM

## 2019-06-08 DIAGNOSIS — M25562 Pain in left knee: Secondary | ICD-10-CM

## 2019-06-08 DIAGNOSIS — R2689 Other abnormalities of gait and mobility: Secondary | ICD-10-CM

## 2019-06-08 LAB — FOLATE RBC
Folate, Hemolysate: 238 ng/mL
Folate, RBC: 525 ng/mL (ref >498)
Hematocrit: 45.3 % (ref 34.0–46.6)

## 2019-06-08 NOTE — Therapy (Signed)
[facility] [address] [place], [place], [address] Phone: [phone]   Fax:  [phone]  Physical Therapy Treatment  Patient Details  Name: Madeline Larsen MRN: [id number] Date of Birth: 1985-07-24 Referring Provider (PT): Anne Ng NP   Encounter Date: 06/08/2019  PT End of Session - 06/08/19 1459    Visit Number  3    Number of Visits  16    Date for PT Re-Evaluation  07/19/19    Authorization Type  [employer] Multi plan 2021 coresource    PT Start Time  1047    PT Stop Time  1133    PT Time Calculation (min)  46 min    Activity Tolerance  Patient tolerated treatment well    Behavior During Therapy  [facility] for tasks assessed/performed       Past Medical History:  Diagnosis Date  . Depression   . Female-to-female transgender person   . GERD (gastroesophageal reflux disease)   . MRSA (methicillin resistant staph aureus) culture positive     Past Surgical History:  Procedure Laterality Date  . FOOT SURGERY Left   . top surgery  02/2018    There were no vitals filed for this visit.  Subjective Assessment - 06/08/19 1054    Subjective  Pt reports general body pain that he rates as a 8/10. Pt reports he has been completing his HEP 3x a day. He feels he is getting stronger, although his pain has not changed.                       [facility] Adult PT Treatment/Exercise - 06/08/19 0001      Exercises   Exercises  Lumbar      Lumbar Exercises: Standing   Other Standing Lumbar Exercises  3x 10 hip hinge with dowel road and VC and TC, 2 x 10 hip hinge with ext cue against wall  DL with 25 # KB on 8 inch step      Lumbar Exercises: Supine   Pelvic Tilt  10 reps    Pelvic Tilt Limitations  3 sec    Bent Knee Raise  10 reps    Bent Knee Raise Limitations  c PPT             PT Education - 06/08/19 1452    Education Details  For abdominal strengthening. Reviewed hurt vs harm re: ther ex for  strengthening/flexibility to promote function c pt's chronic pain    Person(s) Educated  Patient    Methods  Explanation;Demonstration;Tactile cues;Verbal cues;Handout    Comprehension  Verbalized understanding;Returned demonstration;Verbal cues required;Tactile cues required;Need further instruction       PT Short Term Goals - 05/24/19 1444      PT SHORT TERM GOAL #1   Title  Pt will be independent with intial HEP    Time  4    Period  Weeks    Status  New    Target Date  06/21/19      PT SHORT TERM GOAL #2   Title  Pt will be able to flex bil knees to at least 120 degrees to be able to transfer with greater ease and decreased pain    Time  4    Period  Weeks    Status  New    Target Date  06/21/19      PT SHORT TERM GOAL #3   Title  Pt will be able to ascend  steps with alternating steps and UE support with pain 5/10 or less    Time  4    Period  Weeks    Status  New    Target Date  06/21/19      PT SHORT TERM GOAL #4   Title  Pt will demonstrate squat with even wt distribution to show increased functional knee and ankle AROM    Time  4    Period  Weeks    Status  New    Target Date  06/21/19        PT Long Term Goals - 05/24/19 0844      PT LONG TERM GOAL #1   Title  Pt will be independent with  advanced HEP    Time  8    Period  Weeks    Status  New    Target Date  07/19/19      PT LONG TERM GOAL #2   Title  Pt will be able to ascend/descend steps with 3/10 pain and safety    Baseline  presently hold onto bil rails and pulls self up one at a time    Time  8    Period  Weeks    Status  New    Target Date  07/19/19      PT LONG TERM GOAL #3   Title  Pt will be able to lift 50 lb and walk 200 feet without exacerbating pain greater than 3/10 and with safety    Time  8    Period  Weeks    Status  New    Target Date  07/19/19      PT LONG TERM GOAL #4   Title  Pt will be able to sleep using positioning and pain management techniques to achieved 4-5 hours  of restful continuous sleep    Baseline  unable to sleep more than 2 hours    Period  Weeks    Status  New    Target Date  07/19/19      PT LONG TERM GOAL #5   Title  Pt will be able to be consistent with exericise 4 x a week such home walking program    Time  8    Period  Weeks    Status  New    Target Date  07/19/19            Plan - 06/08/19 1500    Clinical Impression Statement  Adbominal strengthening exs were added to pt's HEP. Pt reports completing his HEP consistently. Pt demonstrated proper technique with ther ex/HEP including standing hip hinge c dowel and KB Deadlift. Despite pain, pt participates with good effort.    Personal Factors and Comorbidities  Profession    PT Treatment/Interventions  Cryotherapy;Electrical Stimulation;Iontophoresis 4mg /ml Dexamethasone;Moist Heat;Ultrasound;Therapeutic exercise;Therapeutic activities;Functional mobility training;Stair training;Gait training;Neuromuscular re-education;Patient/family education;Passive range of motion;Manual techniques;Dry needling;Taping;Joint Manipulations    PT Next Visit Plan  Assess response to HEP c the addition abdominal strengthening exs.    PT Home Exercise Plan  [id number]. PPT and PPT c knee lifts       Patient will benefit from skilled therapeutic intervention in order to improve the following deficits and impairments:  Difficulty walking, Decreased mobility, Decreased range of motion, Decreased strength, Hypomobility, Postural dysfunction, Improper body mechanics, Pain, Obesity  Visit Diagnosis: Bilateral low back pain with sciatica, sciatica laterality unspecified, unspecified chronicity  Chronic pain of left knee  Chronic pain of right knee  Pain in  left ankle and joints of left foot  Pain in right ankle and joints of right foot  Muscle weakness (generalized)  Unsteadiness on feet  Other abnormalities of gait and mobility     Problem List Patient Active Problem List   Diagnosis Date  Noted  . Leukopenia 05/26/2019  . S/P mastectomy, bilateral 05/04/2019  . Low TSH level 05/04/2019  . Hyperglycemia 05/04/2019  . Snoring 03/01/2019  . Essential hypertension 01/25/2019  . Chronic idiopathic constipation 01/04/2019  . GERD (gastroesophageal reflux disease) 12/28/2018  . Myalgia 12/16/2018  . Polyarthralgia 12/16/2018  . ANA positive 12/16/2018  . Neutropenia (HCC) 01/27/2018  . 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 Headache 06/18/2017  . 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 Status post left foot surgery 04/09/2015  . HAV (hallux abducto valgus) 09/05/2014    Joellyn Rued MS, PT 06/08/19 3:14 PM  [facility] [address] [place], [place], [address] Phone: [phone]   Fax:  [phone]  Name: Madeline Larsen MRN: [id number] Date of Birth: 1986-01-09

## 2019-06-09 ENCOUNTER — Encounter: Payer: Self-pay | Admitting: Nurse Practitioner

## 2019-06-09 ENCOUNTER — Other Ambulatory Visit: Payer: Self-pay | Admitting: Nurse Practitioner

## 2019-06-09 ENCOUNTER — Other Ambulatory Visit: Payer: Self-pay

## 2019-06-09 ENCOUNTER — Ambulatory Visit: Payer: PRIVATE HEALTH INSURANCE | Attending: Nurse Practitioner

## 2019-06-09 DIAGNOSIS — M25572 Pain in left ankle and joints of left foot: Secondary | ICD-10-CM | POA: Diagnosis present

## 2019-06-09 DIAGNOSIS — M25571 Pain in right ankle and joints of right foot: Secondary | ICD-10-CM | POA: Diagnosis present

## 2019-06-09 DIAGNOSIS — R2681 Unsteadiness on feet: Secondary | ICD-10-CM | POA: Insufficient documentation

## 2019-06-09 DIAGNOSIS — G8929 Other chronic pain: Secondary | ICD-10-CM | POA: Diagnosis present

## 2019-06-09 DIAGNOSIS — M25561 Pain in right knee: Secondary | ICD-10-CM | POA: Diagnosis present

## 2019-06-09 DIAGNOSIS — M6281 Muscle weakness (generalized): Secondary | ICD-10-CM | POA: Diagnosis present

## 2019-06-09 DIAGNOSIS — R2689 Other abnormalities of gait and mobility: Secondary | ICD-10-CM | POA: Insufficient documentation

## 2019-06-09 DIAGNOSIS — M5440 Lumbago with sciatica, unspecified side: Secondary | ICD-10-CM | POA: Insufficient documentation

## 2019-06-09 DIAGNOSIS — M25562 Pain in left knee: Secondary | ICD-10-CM | POA: Diagnosis present

## 2019-06-09 DIAGNOSIS — M2550 Pain in unspecified joint: Secondary | ICD-10-CM

## 2019-06-09 DIAGNOSIS — M791 Myalgia, unspecified site: Secondary | ICD-10-CM

## 2019-06-09 NOTE — Therapy (Signed)
[facility] Outpatient Rehabilitation [facility] [address] [place], [place], [address] Phone: [phone]   Fax:  [phone]  Physical Therapy Treatment  Patient Details  Name: Madeline Larsen MRN: [id number] Date of Birth: 09-17-1985 Referring Provider (PT): Anne Ng NP   Encounter Date: 06/09/2019  PT End of Session - 06/09/19 1437    Visit Number  4    Number of Visits  16    Date for PT Re-Evaluation  07/19/19    PT Start Time  1006    PT Stop Time  1047    PT Time Calculation (min)  41 min    Activity Tolerance  Patient tolerated treatment well    Behavior During Therapy  [facility] for tasks assessed/performed       Past Medical History:  Diagnosis Date  . Depression   . Female-to-female transgender person   . GERD (gastroesophageal reflux disease)   . MRSA (methicillin resistant staph aureus) culture positive     Past Surgical History:  Procedure Laterality Date  . FOOT SURGERY Left   . top surgery  02/2018    There were no vitals filed for this visit.  Subjective Assessment - 06/09/19 1434    Subjective  Pt reports being sore/stiff this AM. Rates back pain a 5/10.                       [facility] Adult PT Treatment/Exercise - 06/09/19 0001      Exercises   Exercises  Lumbar      Lumbar Exercises: Stretches   Active Hamstring Stretch Limitations  10x    Single Knee to Chest Stretch  5 reps;10 seconds    Piriformis Stretch  2 reps;10 seconds    Other Lumbar Stretch Exercise  Hip abd stretch; 2x; 10 sec    Other Lumbar Stretch Exercise  KTC; 2x; 10 sec      Lumbar Exercises: Machines for Strengthening   Other Lumbar Machine Exercise  Cybex hip abd 10x2, each LE; 37.5 lbs      Lumbar Exercises: Standing   Forward Lunge  10 reps;2 seconds   Each LE forward   Other Standing Lumbar Exercises  10x2 hip hinge with dowel road and VC and TC; 10x2 hip hinge 25 # KB       Lumbar Exercises: Supine   Pelvic Tilt  10 reps     Pelvic Tilt Limitations  3 sec    Bent Knee Raise  10 reps    Bent Knee Raise Limitations  c PPT, 2 sets     Bridge  10 reps    Straight Leg Raise  10 reps   Both            PT Education - 06/09/19 1437    Education Details  Proper body mechanics for lifting.    Person(s) Educated  Patient    Methods  Explanation;Demonstration;Tactile cues;Verbal cues;Handout    Comprehension  Verbalized understanding;Returned demonstration;Verbal cues required;Tactile cues required;Need further instruction       PT Short Term Goals - 05/24/19 1444      PT SHORT TERM GOAL #1   Title  Pt will be independent with intial HEP    Time  4    Period  Weeks    Status  New    Target Date  06/21/19      PT SHORT TERM GOAL #2   Title  Pt will be able to flex bil knees to  at least 120 degrees to be able to transfer with greater ease and decreased pain    Time  4    Period  Weeks    Status  New    Target Date  06/21/19      PT SHORT TERM GOAL #3   Title  Pt will be able to ascend steps with alternating steps and UE support with pain 5/10 or less    Time  4    Period  Weeks    Status  New    Target Date  06/21/19      PT SHORT TERM GOAL #4   Title  Pt will demonstrate squat with even wt distribution to show increased functional knee and ankle AROM    Time  4    Period  Weeks    Status  New    Target Date  06/21/19        PT Long Term Goals - 05/24/19 0844      PT LONG TERM GOAL #1   Title  Pt will be independent with  advanced HEP    Time  8    Period  Weeks    Status  New    Target Date  07/19/19      PT LONG TERM GOAL #2   Title  Pt will be able to ascend/descend steps with 3/10 pain and safety    Baseline  presently hold onto bil rails and pulls self up one at a time    Time  8    Period  Weeks    Status  New    Target Date  07/19/19      PT LONG TERM GOAL #3   Title  Pt will be able to lift 50 lb and walk 200 feet without exacerbating pain greater than 3/10 and with  safety    Time  8    Period  Weeks    Status  New    Target Date  07/19/19      PT LONG TERM GOAL #4   Title  Pt will be able to sleep using positioning and pain management techniques to achieved 4-5 hours of restful continuous sleep    Baseline  unable to sleep more than 2 hours    Period  Weeks    Status  New    Target Date  07/19/19      PT LONG TERM GOAL #5   Title  Pt will be able to be consistent with exericise 4 x a week such home walking program    Time  8    Period  Weeks    Status  New    Target Date  07/19/19            Plan - 06/09/19 1438    Clinical Impression Statement  Pt demonstrates good body mechanics for for hinged hip lifting. With lunges pt demonstrates hip weakness/instability. Pt continues to participate well with his rehab program.    PT Treatment/Interventions  Cryotherapy;Electrical Stimulation;Iontophoresis 4mg /ml Dexamethasone;Moist Heat;Ultrasound;Therapeutic exercise;Therapeutic activities;Functional mobility training;Stair training;Gait training;Neuromuscular re-education;Patient/family education;Passive range of motion;Manual techniques;Dry needling;Taping;Joint Manipulations    PT Next Visit Plan  Introduce ther ex to address hip weakness    PT Home Exercise Plan  . PPT and PPT c knee lifts       Patient will benefit from skilled therapeutic intervention in order to improve the following deficits and impairments:  Difficulty walking, Decreased mobility, Decreased range of motion, Decreased strength, Hypomobility,  Postural dysfunction, Improper body mechanics, Pain, Obesity  Visit Diagnosis: Bilateral low back pain with sciatica, sciatica laterality unspecified, unspecified chronicity  Muscle weakness (generalized)  Chronic pain of left knee  Unsteadiness on feet  Chronic pain of right knee  Other abnormalities of gait and mobility  Pain in left ankle and joints of left foot  Pain in right ankle and joints of right  foot     Problem List Patient Active Problem List   Diagnosis Date Noted  . Leukopenia 05/26/2019  . S/P mastectomy, bilateral 05/04/2019  . Low TSH level 05/04/2019  . Hyperglycemia 05/04/2019  . Snoring 03/01/2019  . Essential hypertension 01/25/2019  . Chronic idiopathic constipation 01/04/2019  . GERD (gastroesophageal reflux disease) 12/28/2018  . Myalgia 12/16/2018  . Polyarthralgia 12/16/2018  . ANA positive 12/16/2018  . Neutropenia ([place]) 01/27/2018  . 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 Headache 06/18/2017  . 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 Status post left foot surgery 04/09/2015  . HAV (hallux abducto valgus) 09/05/2014    Gar Ponto MS, PT 06/09/19 4:01 PM   [facility] [address] [place], [place], [address] Phone: [phone]   Fax:  [phone]  Name: Madeline Larsen MRN: [id number] Date of Birth: 05/29/1985

## 2019-06-10 ENCOUNTER — Telehealth: Payer: Self-pay | Admitting: Hematology

## 2019-06-10 NOTE — Telephone Encounter (Signed)
Scheduled per 03/30 los, patient has been called and notified. ?

## 2019-06-14 ENCOUNTER — Ambulatory Visit: Payer: PRIVATE HEALTH INSURANCE | Admitting: Physical Therapy

## 2019-06-16 ENCOUNTER — Ambulatory Visit: Payer: PRIVATE HEALTH INSURANCE | Admitting: Physical Therapy

## 2019-06-21 ENCOUNTER — Ambulatory Visit: Payer: PRIVATE HEALTH INSURANCE | Admitting: Physical Therapy

## 2019-06-21 ENCOUNTER — Other Ambulatory Visit: Payer: Self-pay

## 2019-06-21 ENCOUNTER — Encounter: Payer: Self-pay | Admitting: Physical Therapy

## 2019-06-21 DIAGNOSIS — M25562 Pain in left knee: Secondary | ICD-10-CM

## 2019-06-21 DIAGNOSIS — M5440 Lumbago with sciatica, unspecified side: Secondary | ICD-10-CM

## 2019-06-21 DIAGNOSIS — G8929 Other chronic pain: Secondary | ICD-10-CM

## 2019-06-21 DIAGNOSIS — R2681 Unsteadiness on feet: Secondary | ICD-10-CM

## 2019-06-21 DIAGNOSIS — M25572 Pain in left ankle and joints of left foot: Secondary | ICD-10-CM

## 2019-06-21 DIAGNOSIS — M25571 Pain in right ankle and joints of right foot: Secondary | ICD-10-CM

## 2019-06-21 DIAGNOSIS — R2689 Other abnormalities of gait and mobility: Secondary | ICD-10-CM

## 2019-06-21 DIAGNOSIS — M6281 Muscle weakness (generalized): Secondary | ICD-10-CM

## 2019-06-21 NOTE — Patient Instructions (Signed)
   Trigger Point Dry Needling  . What is Trigger Point Dry Needling (DN)? o DN is a physical therapy technique used to treat muscle pain and dysfunction. Specifically, DN helps deactivate muscle trigger points (muscle knots).  o A thin filiform needle is used to penetrate the skin and stimulate the underlying trigger point. The goal is for a local twitch response (LTR) to occur and for the trigger point to relax. No medication of any kind is injected during the procedure.   . What Does Trigger Point Dry Needling Feel Like?  o The procedure feels different for each individual patient. Some patients report that they do not actually feel the needle enter the skin and overall the process is not painful. Very mild bleeding may occur. However, many patients feel a deep cramping in the muscle in which the needle was inserted. This is the local twitch response.   Marland Kitchen How Will I feel after the treatment? o Soreness is normal, and the onset of soreness may not occur for a few hours. Typically this soreness does not last longer than two days.  o Bruising is uncommon, however; ice can be used to decrease any possible bruising.  o In rare cases feeling tired or nauseous after the treatment is normal. In addition, your symptoms may get worse before they get better, this period will typically not last longer than 24 hours.   . What Can I do After My Treatment? o Increase your hydration by drinking more water for the next 24 hours. o You may place ice or heat on the areas treated that have become sore, however, do not use heat on inflamed or bruised areas. Heat often brings more relief post needling. o You can continue your regular activities, but vigorous activity is not recommended initially after the treatment for 24 hours. o DN is best combined with other physical therapy such as strengthening, stretching, and other therapies.   [name], PT Certified Exercise Expert for the Aging Adult  06/21/19  9:56 AM Phone: [phone] Fax: [phone]

## 2019-06-21 NOTE — Therapy (Signed)
[facility] Rehabilitation [facility] [address] [place], [place], [address] Phone: [phone]   Fax:  [phone]  Physical Therapy Treatment  Patient Details  Name: Madeline Larsen MRN: [id number] Date of Birth: [birth year] Referring Provider (PT): Anne Ng NP   Encounter Date: 06/21/2019  PT End of Session - 06/21/19 0933    Visit Number  5    Number of Visits  16    Date for PT Re-Evaluation  07/19/19    Authorization Type  [employer] Multi plan 2021 coresource    PT Start Time  0930    PT Stop Time  1031    PT Time Calculation (min)  61 min    Activity Tolerance  Patient tolerated treatment well    Behavior During Therapy  [facility] for tasks assessed/performed       Past Medical History:  Diagnosis Date  . Depression   . Female-to-female transgender person   . GERD (gastroesophageal reflux disease)   . MRSA (methicillin resistant staph aureus) culture positive     Past Surgical History:  Procedure Laterality Date  . FOOT SURGERY Left   . top surgery  02/2018    There were no vitals filed for this visit.  Subjective Assessment - 06/21/19 0933    Subjective  I had been doing well but now I feel all over body aches but specifically in my ankles and my low back.  I think I could try the TPDN today to help relieve pain My doctor thinks I might have Sjogrens dz A but not B. I am doing back to see my doctor    Pertinent History  GERD, OA, HTN +ANA, female to female transgender,    Limitations  Lifting;Walking;House hold activities    How long can you sit comfortably?  unlimited    Diagnostic tests  MRI cervical Normal, mild degenerative changes    Patient Stated Goals  Pt goals is to get strength back and retrun to job to carry items    Currently in Pain?  Yes    Pain Score  9     Pain Location  Ankle    Pain Orientation  Right;Left    Pain Descriptors / Indicators  Aching;Sore    Pain Type  Chronic pain    Pain Onset  More than a month  ago    Pain Frequency  Constant    Pain Score  10    Pain Location  Back    Pain Orientation  Right;Left    Pain Descriptors / Indicators  Sore;Aching    Pain Type  Chronic pain    Pain Onset  More than a month ago    Pain Frequency  Constant         [facility] PT Assessment - 06/21/19 0001      Assessment   Medical Diagnosis  Polyarthralgia myalgia, ataxia (gait disturbance)    Referring Provider (PT)  Nche, Bonna Gains NP    Onset Date/Surgical Date  12/28/18   for  past 5 years     Squat   Comments  pt able to squat with femur parallel and even distribution      Lunges   Comments  Pt able to get into a 1/2 kneeling position with 5/10 pain      AROM   Right Knee Extension  0    Right Knee Flexion  125    Left Knee Extension  0    Left Knee Flexion  121  Right Ankle Dorsiflexion  10    Left Ankle Dorsiflexion  13    Lumbar Flexion  85%   finger tip to ankle   Lumbar Extension  75%    Lumbar - Right Side Bend  75%    Lumbar - Left Side Bend  75%    Lumbar - Right Rotation  75%    Lumbar - Left Rotation  75%                   [facility] Adult PT Treatment/Exercise - 06/21/19 0001      Self-Care   Self-Care  Other Self-Care Comments    Other Self-Care Comments   eduated on TPDN after care and precautioans      Exercises   Exercises  Lumbar;Ankle      Lumbar Exercises: Stretches   Quadruped Mid Back Stretch  5 reps;30 seconds    Quadruped Mid Back Stretch Limitations  one in middle, 2 to each side for QL stretch      Lumbar Exercises: Standing   Other Standing Lumbar Exercises  10x2 hip hinge with dowel road and VC and TC; 10x2 hip hinge 25 # KB ; use dowel infront of foot and 1/2 kneeling , with knee on EXT and INT of rod  with heel down and increasing knee flexion/ankle DF stretch 10 x 2, ( opp knee on Ther ex pad for comfort)  and post Tilt VC for abd engagment    Other Standing Lumbar Exercises  DL 6 x 1 with 31# DL VC, 3 x 6 DL with #54 KB       Lumbar Exercises: Supine   Pelvic Tilt  10 reps    Pelvic Tilt Limitations  3 sec    Bridge  10 reps      Manual Therapy   Manual Therapy  Soft tissue mobilization    Manual therapy comments  skilled palpation for TPDN    Soft tissue mobilization  Bil QL      Ankle Exercises: Standing   Other Standing Ankle Exercises  standing knee flexion/ankle flexion on chair 2 x 10 15 -20 sec hold RT and LT with black T band self mob AP tibia in exericise     relieved ankle pain to 5/10      Trigger Point Dry Needling - 06/21/19 0001    Consent Given?  Yes    Education Handout Provided  Yes    Muscles Treated Back/Hip  Quadratus lumborum   RT and LT   Dry Needling Comments  .30 x 75     Quadratus Lumborum Response  Twitch response elicited;Palpable increased muscle length           PT Education - 06/21/19 0956    Education Details  Educated on TPDN and updated HEP for ankle knee AROM with self mob    Person(s) Educated  Patient    Methods  Explanation;Demonstration;Tactile cues;Verbal cues;Handout    Comprehension  Verbalized understanding;Returned demonstration       PT Short Term Goals - 06/21/19 1027      PT SHORT TERM GOAL #1   Title  Pt will be independent with intial HEP    Time  4    Period  Weeks    Status  Achieved    Target Date  06/21/19      PT SHORT TERM GOAL #2   Title  Pt will be able to flex bil knees to at least 120 degrees to be able  to transfer with greater ease and decreased pain    Baseline  06-21-19 RT knee 125, LT 121    Time  4    Period  Weeks    Status  Achieved      PT SHORT TERM GOAL #3   Title  Pt will be able to ascend steps with alternating steps and UE support with pain 5/10 or less    Baseline  Pt was 10/10 in back and 9/10 in ankles today.  After RX pt 5/10 but not able to assess stairs    Time  4    Period  Weeks    Status  On-going    Target Date  06/21/19      PT SHORT TERM GOAL #4   Title  Pt will demonstrate squat with even wt  distribution to show increased functional knee and ankle AROM    Baseline  Pt able to perform squat with femur parallel and even distribution    Time  4    Period  Weeks    Status  Achieved        PT Long Term Goals - 06/21/19 1042      PT LONG TERM GOAL #1   Title  Pt will be independent with  advanced HEP    Time  8    Period  Weeks    Status  On-going    Target Date  07/19/19      PT LONG TERM GOAL #2   Title  Pt will be able to ascend/descend steps with 3/10 pain and safety    Time  8    Period  Weeks    Status  On-going    Target Date  07/19/19      PT LONG TERM GOAL #3   Title  Pt will be able to lift 50 lb and walk 200 feet without exacerbating pain greater than 3/10 and with safety    Time  8    Period  Weeks    Status  On-going    Target Date  07/19/19      PT LONG TERM GOAL #4   Title  Pt will be able to sleep using positioning and pain management techniques to achieved 4-5 hours of restful continuous sleep    Time  8    Period  Weeks    Status  On-going    Target Date  07/19/19      PT LONG TERM GOAL #5   Title  Pt will be able to be consistent with exericise 4 x a week such home walking program    Time  8    Period  Weeks    Status  On-going    Target Date  07/19/19          Access Code: [email or web address]: [email or web address]: 04/13/2021Prepared by: Donnetta Simpers BeardsleyExercises  Standing Knee Flexion Stretch on Step - 2 x daily - 7 x weekly - 1 sets - 10 reps - 15-20 hold    Plan - 06/21/19 1039    Clinical Impression Statement  Mr Sforza arrives with full body pain and specifically 9/10 bil ankle pain and 10/10 back pain.  Pt able to perform exericises and new exercise for ankles that reduced pain to 5/10   Pt consented to TPDN and was closely monitored throughout session. Pt reponded well to TPDN and reported minimal pain after RX   All STG achieved today except for Stair climbing which was not able to be assessed.  Pt would benefit from  reinforcing HEP for low back and bil hip weakness for remaining 3 sessions    Personal Factors and Comorbidities  Profession    Comorbidities  GERD, OA, HTN +ANA, obesity, female to female transgender, see chart for additional medical    Examination-Activity Limitations  Carry;Lift;Sleep;Locomotion Level;Stairs;Squat;Stand    Examination-Participation Restrictions  Cleaning;Laundry;Other    Stability/Clinical Decision Making  Evolving/Moderate complexity    Clinical Decision Making  Moderate    Rehab Potential  Good    PT Frequency  2x / week    PT Duration  8 weeks    PT Treatment/Interventions  Cryotherapy;Electrical Stimulation;Iontophoresis 4mg /ml Dexamethasone;Moist Heat;Ultrasound;Therapeutic exercise;Therapeutic activities;Functional mobility training;Stair training;Gait training;Neuromuscular re-education;Patient/family education;Passive range of motion;Manual techniques;Dry needling;Taping;Joint Manipulations    PT Next Visit Plan  Reinforce ther ex to address hip weakness/low back weakness    PT Home Exercise Plan  . PPT and PPT c knee lifts, [id number]    Consulted and Agree with Plan of Care  Patient       Patient will benefit from skilled therapeutic intervention in order to improve the following deficits and impairments:  Difficulty walking, Decreased mobility, Decreased range of motion, Decreased strength, Hypomobility, Postural dysfunction, Improper body mechanics, Pain, Obesity  Visit Diagnosis: Bilateral low back pain with sciatica, sciatica laterality unspecified, unspecified chronicity  Muscle weakness (generalized)  Chronic pain of left knee  Unsteadiness on feet  Chronic pain of right knee  Other abnormalities of gait and mobility  Pain in left ankle and joints of left foot  Pain in right ankle and joints of right foot     Problem List Patient Active Problem List   Diagnosis Date Noted  . Leukopenia 05/26/2019  . S/P mastectomy, bilateral  05/04/2019  . Low TSH level 05/04/2019  . Hyperglycemia 05/04/2019  . Snoring 03/01/2019  . Essential hypertension 01/25/2019  . Chronic idiopathic constipation 01/04/2019  . GERD (gastroesophageal reflux disease) 12/28/2018  . Myalgia 12/16/2018  . Polyarthralgia 12/16/2018  . ANA positive 12/16/2018  . Neutropenia (HCC) 01/27/2018  . 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 Headache 06/18/2017  . 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 Status post left foot surgery 04/09/2015  . HAV (hallux abducto valgus) 09/05/2014    09/07/2014, PT Certified Exercise Expert for the Aging Adult  06/21/19 10:46 AM Phone: [phone] Fax: [phone]  [facility] Outpatient Rehabilitation [facility] [address] [place], [place], [place] Phone: [phone]   Fax:  [phone]  Name: ESSYNCE MUNSCH MRN: [name] Date of Birth: 1985/04/07

## 2019-06-22 ENCOUNTER — Ambulatory Visit: Payer: Self-pay | Admitting: Family Medicine

## 2019-06-23 ENCOUNTER — Ambulatory Visit: Payer: PRIVATE HEALTH INSURANCE | Admitting: Physical Therapy

## 2019-06-24 ENCOUNTER — Ambulatory Visit: Payer: PRIVATE HEALTH INSURANCE | Admitting: Physical Therapy

## 2019-06-28 ENCOUNTER — Other Ambulatory Visit: Payer: Self-pay

## 2019-06-28 ENCOUNTER — Ambulatory Visit: Payer: PRIVATE HEALTH INSURANCE | Admitting: Physical Therapy

## 2019-06-28 ENCOUNTER — Encounter: Payer: Self-pay | Admitting: Physical Therapy

## 2019-06-28 DIAGNOSIS — M6281 Muscle weakness (generalized): Secondary | ICD-10-CM

## 2019-06-28 DIAGNOSIS — M25562 Pain in left knee: Secondary | ICD-10-CM

## 2019-06-28 DIAGNOSIS — G8929 Other chronic pain: Secondary | ICD-10-CM

## 2019-06-28 DIAGNOSIS — R2689 Other abnormalities of gait and mobility: Secondary | ICD-10-CM

## 2019-06-28 DIAGNOSIS — M25572 Pain in left ankle and joints of left foot: Secondary | ICD-10-CM

## 2019-06-28 DIAGNOSIS — M25571 Pain in right ankle and joints of right foot: Secondary | ICD-10-CM

## 2019-06-28 DIAGNOSIS — R2681 Unsteadiness on feet: Secondary | ICD-10-CM

## 2019-06-28 DIAGNOSIS — M5440 Lumbago with sciatica, unspecified side: Secondary | ICD-10-CM

## 2019-06-28 NOTE — Therapy (Signed)
[place], [place], [address] Phone: [phone]   Fax:  [phone]  Physical Therapy Treatment  Patient Details  Name: Madeline Larsen MRN: [id number] Date of Birth: Jul 19, 1985 Referring Provider (PT): Flossie Buffy NP   Encounter Date: 06/28/2019  PT End of Session - 06/28/19 1715    Visit Number  6    Number of Visits  16    Date for PT Re-Evaluation  07/19/19    Authorization Type  PHCS Multi plan 2021 coresource    PT Start Time  0934    PT Stop Time  1030    PT Time Calculation (min)  56 min    Activity Tolerance  Patient tolerated treatment well    Behavior During Therapy  [facility] for tasks assessed/performed       Past Medical History:  Diagnosis Date  . Depression   . Female-to-female transgender person   . GERD (gastroesophageal reflux disease)   . MRSA (methicillin resistant staph aureus) culture positive     Past Surgical History:  Procedure Laterality Date  . FOOT SURGERY Left   . top surgery  02/2018    There were no vitals filed for this visit.  Subjective Assessment - 06/28/19 0937    Subjective  I am starting out as an 8/10 I am going to try to call my rheumatologist. I have an appt.  I just need to adjust my meds maybe    Pertinent History  GERD, OA, HTN +ANA, female to female transgender,    Limitations  Lifting;Walking;House hold activities    Currently in Pain?  Yes    Pain Score  0-No pain    Pain Location  Ankle    Pain Orientation  Right;Left    Pain Score  8    Pain Location  Back    Pain Orientation  Right;Left    Pain Descriptors / Indicators  Sore;Aching    Pain Radiating Towards  Lt hip today    Pain Onset  More than a month ago                       [facility] Adult PT Treatment/Exercise - 06/28/19 0001      Therapeutic Activites    Therapeutic Activities  Work [employer]    Work Simulation  pt carrying 50 lb box over 200 feet and then up  and down 8 steps x 2 to simulate work activity   no pain      Exercises   Exercises  Lumbar      Lumbar Exercises: Stretches   Single Knee to Chest Stretch  5 reps;10 seconds    Quadruped Mid Back Stretch  5 reps;30 seconds    Quadruped Mid Back Stretch Limitations  one in middle, 2 to each side for QL stretch      Lumbar Exercises: Aerobic   Elliptical  7 minutes level 3 RPE 5-6/10      Lumbar Exercises: Quadruped   Madcat/Old Horse  10 reps    Straight Leg Raise  15 reps;3 seconds    Opposite Arm/Leg Raise  Right arm/Left leg;Left arm/Right leg;10 reps;3 seconds   slight unsteadiness  VC/TC   Other Quadruped Lumbar Exercises  The hundre table top 100x  5 increments breath in and breath out     Other Quadruped Lumbar Exercises  bird dog with knee taps  x 15 each alternating side      Modalities  Modalities  Moist Heat      Moist Heat Therapy   Number Minutes Moist Heat  12 Minutes    Moist Heat Location  Lumbar Spine   bil QL     Manual Therapy   Manual Therapy  Soft tissue mobilization    Manual therapy comments  skilled palpation for TPDN    Soft tissue mobilization  Bil QL       Trigger Point Dry Needling - 06/28/19 0001    Consent Given?  Yes    Education Handout Provided  Previously provided    Muscles Treated Back/Hip  Quadratus lumborum   RT and LT   Dry Needling Comments  .30 x 75     Quadratus Lumborum Response  Twitch response elicited;Palpable increased muscle length           PT Education - 06/28/19 0959    Education Details  Added to HEP today with prone/quadriped    Person(s) Educated  Patient    Methods  Explanation;Demonstration;Tactile cues;Verbal cues;Handout    Comprehension  Verbalized understanding;Returned demonstration       PT Short Term Goals - 06/21/19 1027      PT SHORT TERM GOAL #1   Title  Pt will be independent with intial HEP    Time  4    Period  Weeks    Status  Achieved    Target Date  06/21/19      PT SHORT TERM  GOAL #2   Title  Pt will be able to flex bil knees to at least 120 degrees to be able to transfer with greater ease and decreased pain    Baseline  06-21-19 RT knee 125, LT 121    Time  4    Period  Weeks    Status  Achieved      PT SHORT TERM GOAL #3   Title  Pt will be able to ascend steps with alternating steps and UE support with pain 5/10 or less    Baseline  Pt was 10/10 in back and 9/10 in ankles today.  After RX pt 5/10 but not able to assess stairs    Time  4    Period  Weeks    Status  On-going    Target Date  06/21/19      PT SHORT TERM GOAL #4   Title  Pt will demonstrate squat with even wt distribution to show increased functional knee and ankle AROM    Baseline  Pt able to perform squat with femur parallel and even distribution    Time  4    Period  Weeks    Status  Achieved        PT Long Term Goals - 06/28/19 1011      PT LONG TERM GOAL #1   Title  Pt will be independent with  advanced HEP    Baseline  completed Advanced education on exercise    Time  8    Period  Weeks    Status  On-going    Target Date  07/19/19      PT LONG TERM GOAL #2   Title  Pt will be able to ascend/descend steps with 3/10 pain and safety    Baseline  Pt able to carry 50 lb up and down 8 steps but with 6/10 pain    Time  8    Period  Weeks    Status  Partially Met      PT  LONG TERM GOAL #3   Title  Pt will be able to lift 50 lb and walk 200 feet without exacerbating pain greater than 3/10 and with safety    Baseline  Pt able to carry 50 lb for 200 feet and up and down 8 steps  pain 6/10    Time  8    Period  Weeks    Status  Partially Met      PT LONG TERM GOAL #4   Title  Pt will be able to sleep using positioning and pain management techniques to achieved 4-5 hours of restful continuous sleep    Baseline  unable to sleep more than 2 hours    Time  8    Period  Weeks    Status  On-going      PT LONG TERM GOAL #5   Title  Pt will be able to be consistent with exericise 4  x a week such home walking program    Time  8    Period  Weeks    Status  On-going         Access Code: [email or web address]: [email or web address]: 04/20/2021Prepared by: Donnetta Simpers BeardsleyExercises  The Hundred 3 Intermediate - Table Top - 1 x daily - 7 x weekly - 3 sets - 100 reps  Quadruped Alternating Leg Extensions - 1 x daily - 7 x weekly - 10 reps - 3 sets - 5 hold  Bird Dog - 1 x daily - 7 x weekly - 3 sets - 10 reps - 5 hold  Bird Dog with Knee Taps - 1 x daily - 7 x weekly - 3 sets - 10 reps - 5 hold     Plan - 06/28/19 1123    Clinical Impression Statement  Mr. Shultis is now able to carry 50 lb for at least 200 feet and able to carry up and down 8 steps. He partially met # 2 and # 3 LTG but pain level is at 6/10 and not 3/10 goal.so only partially met.   Pt is almost independent  with HEP.  He has exhibited increased strength.  he , however, continues to have 8/10 pain and reduced to 6/10 pain.  He will return to rheumatologist and seek new medicine alternatives tohelp with pain levels. He has been compliant with exericises and ankle pain has resolved.    Personal Factors and Comorbidities  Profession    Comorbidities  GERD, OA, HTN +ANA, obesity, female to female transgender, see chart for additional medical    Examination-Activity Limitations  Carry;Lift;Sleep;Locomotion Level;Stairs;Squat;Stand    Examination-Participation Restrictions  Cleaning;Laundry;Other    PT Frequency  2x / week    PT Duration  8 weeks    PT Treatment/Interventions  Cryotherapy;Electrical Stimulation;Iontophoresis 7m/ml Dexamethasone;Moist Heat;Ultrasound;Therapeutic exercise;Therapeutic activities;Functional mobility training;Stair training;Gait training;Neuromuscular re-education;Patient/family education;Passive range of motion;Manual techniques;Dry needling;Taping;Joint Manipulations    PT Next Visit Plan  Reinforce ther ex to address hip weakness/low back weakness    PT Home Exercise Plan   [id number] PPT and PPT c knee lifts, [id number]  [email or web address]    Consulted and Agree with Plan of Care  Patient       Patient will benefit from skilled therapeutic intervention in order to improve the following deficits and impairments:  Difficulty walking, Decreased mobility, Decreased range of motion, Decreased strength, Hypomobility, Postural dysfunction, Improper body mechanics, Pain, Obesity  Visit Diagnosis: Bilateral low back pain with sciatica, sciatica laterality unspecified, unspecified chronicity  Muscle weakness (generalized)  Chronic pain of left  knee  Unsteadiness on feet  Chronic pain of right knee  Other abnormalities of gait and mobility  Pain in left ankle and joints of left foot  Pain in right ankle and joints of right foot     Problem List Patient Active Problem List   Diagnosis Date Noted  . Leukopenia 05/26/2019  . S/P mastectomy, bilateral 05/04/2019  . Low TSH level 05/04/2019  . Hyperglycemia 05/04/2019  . Snoring 03/01/2019  . Essential hypertension 01/25/2019  . Chronic idiopathic constipation 01/04/2019  . GERD (gastroesophageal reflux disease) 12/28/2018  . Myalgia 12/16/2018  . Polyarthralgia 12/16/2018  . ANA positive 12/16/2018  . Neutropenia ([place]) 01/27/2018  . 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 Headache 06/18/2017  . 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 Status post left foot surgery 04/09/2015  . HAV (hallux abducto valgus) 09/05/2014    Voncille Lo, PT Certified Exercise Expert for the Aging Adult  06/28/19 5:20 PM Phone: [phone] Fax: [place] [facility] [address] [place], [place], [address] Phone: [phone]   Fax:  [phone]  Name: JULIEANN DRUMMONDS MRN: [id number] Date of Birth: 07-19-1985

## 2019-06-28 NOTE — Patient Instructions (Signed)
   [name], PT Certified Exercise Expert for the Aging Adult  06/28/19 9:58 AM Phone: [phone] Fax: [phone]

## 2019-06-28 NOTE — Progress Notes (Signed)
Office Visit Note  Patient: Madeline Larsen             Date of Birth: [birth year]           MRN: [id number]             PCP: Flossie Buffy, NP Referring: Flossie Buffy, NP Visit Date: 07/05/2019 Occupation: _0 @  Subjective:  Generalized pain   History of Present Illness: Madeline Larsen is a 34 y.o. adult with history of positive ANA and osteoarthritis.  He is taking plaquenil 200 mg 1 tablet by mouth twice daily.  He is tolerating Plaquenil without any side effects.  He states that he has noticed a significant improvement while taking Plaquenil.  He states that if he misses any doses of Plaquenil he notices a significant difference.  He states that he continues to have generalized myalgias and arthralgias.  He states that his pain level is his primary concern.  He states that his primary care referred him to physical therapy which helped with muscle strengthening but has not helped with the myalgias.  He states that he takes Tylenol as needed for pain relief and continues to take gabapentin and Cymbalta as prescribed.  He states that his knee joints and ankle joints swell intermittently.  He denies any recent rashes, photosensitivity, symptoms of Raynaud's, oral or nasal ulcerations, or enlarged lymph nodes.  He has been experiencing increased eye dryness and is using over-the-counter eyedrops.  He continues to have significant fatigue.   Activities of Daily Living:  Patient reports morning stiffness for  30 minutes.   Patient Reports nocturnal pain.  Difficulty dressing/grooming: Denies Difficulty climbing stairs: Reports Difficulty getting out of chair: Reports Difficulty using hands for taps, buttons, cutlery, and/or writing: Denies  Review of Systems  Constitutional: Positive for fatigue. Negative for night sweats.  HENT: Positive for mouth sores. Negative for mouth dryness and nose dryness.   Eyes: Positive for dryness. Negative for redness.  Respiratory:  Negative for cough, hemoptysis, shortness of breath and difficulty breathing.   Cardiovascular: Negative for chest pain, palpitations, hypertension, irregular heartbeat and swelling in legs/feet.  Gastrointestinal: Positive for constipation. Negative for blood in stool and diarrhea.  Endocrine: Negative for increased urination.  Genitourinary: Negative for painful urination.  Musculoskeletal: Positive for arthralgias, joint pain, myalgias, morning stiffness, muscle tenderness and myalgias. Negative for joint swelling and muscle weakness.  Skin: Negative for color change, rash, hair loss, nodules/bumps, skin tightness, ulcers and sensitivity to sunlight.  Allergic/Immunologic: Negative for susceptible to infections.  Neurological: Negative for dizziness, fainting, memory loss, night sweats and weakness.  Hematological: Negative for swollen glands.  Psychiatric/Behavioral: Negative for depressed mood and sleep disturbance. The patient is not nervous/anxious.     PMFS History:  Patient Active Problem List   Diagnosis Date Noted  . Leukopenia 05/26/2019  . S/P mastectomy, bilateral 05/04/2019  . Low TSH level 05/04/2019  . Hyperglycemia 05/04/2019  . Snoring 03/01/2019  . Essential hypertension 01/25/2019  . Chronic idiopathic constipation 01/04/2019  . GERD (gastroesophageal reflux disease) 12/28/2018  . Myalgia 12/16/2018  . Polyarthralgia 12/16/2018  . ANA positive 12/16/2018  . Neutropenia ([place]) 01/27/2018  . 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 Headache 06/18/2017  . 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 Status post left foot surgery 04/09/2015  . 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 Depression   . Female-to-female transgender person   . GERD (gastroesophageal reflux disease)   .  MRSA (methicillin resistant staph aureus) culture positive     Family History  Problem Relation Age of Onset  . Alcohol abuse Mother   .  Cancer Mother 85       Uterine cancer  . Drug abuse Mother   . HIV/AIDS Mother   . Early death Mother 98       uterine cancer  . Alcohol abuse Father   . Drug abuse Father   . Polymyositis Father 31       deceased at age 68  . Early death Father 76       polymyositis  . Alcohol abuse Sister   . Depression Sister   . Drug abuse Sister   . Depression Brother   . Kidney disease Maternal Grandmother        ESRD on dialysis  . Hypertension Maternal Grandmother   . Rheum arthritis Paternal Grandmother   . Rheum arthritis Brother   . Depression Sister   . Depression Sister   . Depression Sister   . Lupus Cousin   . Lupus Cousin   . Lupus Cousin   . Prostate cancer Paternal Uncle    Past Surgical History:  Procedure Laterality Date  . FOOT SURGERY Left   . top surgery  02/2018   Social History   Social History Narrative   Lives at home with wife & kids   Right handed   Immunization History  Administered Date(s) Administered  . Tdap 01/26/2018     Objective: Vital Signs: BP 121/88 (BP Location: Left Arm, Patient Position: Sitting, Cuff Size: Normal)   Pulse (!) 57   Resp 16   Ht _0  (1.575 m)   Wt 220 lb 6.4 oz (100 kg)   BMI 40.31 kg/m    Physical Exam Vitals and nursing note reviewed.  Constitutional:      Appearance: He is well-developed.  HENT:     Head: Normocephalic and atraumatic.  Eyes:     Conjunctiva/sclera: Conjunctivae normal.     Pupils: Pupils are equal, round, and reactive to light.  Pulmonary:     Effort: Pulmonary effort is normal.  Abdominal:     General: Bowel sounds are normal.     Palpations: Abdomen is soft.  Musculoskeletal:     Cervical back: Normal range of motion and neck supple.  Skin:    General: Skin is warm and dry.     Capillary Refill: Capillary refill takes less than 2 seconds.  Neurological:     Mental Status: He is alert and oriented to person, place, and time.  Psychiatric:        Behavior: Behavior normal.        Musculoskeletal Exam: Generalized hyperalgesia.  C-spine, thoracic spine, and lumbar spine good ROM.  Shoulder joints, elbow joints, wrist joints, MCPs, PIPs, and DIPs good ROM with no synovitis.  Complete fist formation bilaterally.  Hip joints, knee joints, ankle joints good ROM with no discomfort.  No warmth or effusion of knee joints.  No tenderness or swelling of ankle joints.   CDAI Exam:  CDAI Score: -- Patient Global: --; Provider Global: -- Swollen: --; Tender: -- Joint Exam 07/05/2019   No joint exam has been documented for this visit   There is currently no information documented on the homunculus. Go to the Rheumatology activity and complete the homunculus joint exam.  Investigation: No additional findings.  Imaging: No results found.  Recent Labs: Lab Results  Component Value Date   WBC 2.9 (  L) 06/07/2019   HGB 14.2 06/07/2019   PLT 249 06/07/2019   NA 137 06/07/2019   K 4.3 06/07/2019   CL 103 06/07/2019   CO2 26 06/07/2019   GLUCOSE 81 06/07/2019   BUN 10 06/07/2019   CREATININE 0.90 06/07/2019   BILITOT 0.7 06/07/2019   ALKPHOS 63 06/07/2019   AST 31 06/07/2019   ALT 25 06/07/2019   PROT 8.1 06/07/2019   ALBUMIN 4.0 06/07/2019   CALCIUM 9.6 06/07/2019   GFRAA >60 06/07/2019    Speciality Comments: No specialty comments available.  Procedures:  No procedures performed Allergies: Patient has no known allergies.   Assessment / Plan:     Visit Diagnoses: Positive ANA (antinuclear antibody) -  ANA 1: 320 homogeneous, SSA> 8.0, dsDNA 7, rest the ENA was negative, complements normal, ESR 6, history of neutropenia: He has not had any signs or symptoms of a flare.  He is clinically been doing well on Plaquenil 200 mg 1 tablet by mouth twice daily.  He has noticed a significant improvement in his symptoms since starting on Plaquenil.  He continues to have generalized arthralgias and myalgias but no synovitis or muscle weakness was noted on exam today.  He  recently went to physical therapy which helped with muscle strengthening but did not help with his overall discomfort.  He continues to take Cymbalta, gabapentin, and takes Tylenol as needed.  He has not had any recent rashes.  No Maller rash was noted on exam.  He has not had any photosensitivity.  He has been experiencing increased eye dryness and has been using over-the-counter eyedrops for symptomatic relief.  He is not experiencing any mouth dryness currently.  He has not had any oral or nasal ulcerations.  He has not had any symptoms of Raynaud's.  No digital ulcerations or signs of gangrene were noted.  He has not had any enlarged lymph nodes.  He continues to have chronic fatigue which has been stable.  He will continue taking Plaquenil as prescribed.  A refill of Plaquenil sent to the pharmacy today.  Referral to Dr. Katy Fitch was placed today for the patient to schedule a Plaquenil eye exam.  He was advised to notify us if he develops any new or worsening symptoms.  He will follow-up in the office in 5 months.- Plan: ANA, Sjogrens syndrome-A extractable nuclear antibody, Anti-DNA antibody, double-stranded, C3 and C4, Sedimentation rate, Urinalysis, Routine w reflex microscopic  High risk medication use -  PLQ 200 mg 1 tablet by mouth twice daily (started in July 2020).  He is tolerating Plaquenil without any side effects.  He has not had a baseline Plaquenil eye exam.  We discussed the importance of having the eye exam performed soon as possible.  A referral to Dr. Katy Fitch was placed today.  CBC and CMP were drawn today to monitor for drug toxicity.   - Plan: COMPLETE METABOLIC PANEL WITH GFR, CBC with Differential/Platelet, Ambulatory referral to Ophthalmology  Primary osteoarthritis of both knees: He experiences intermittent discomfort in both knee joints.  He has good range of motion of both knees on exam.  No warmth or effusion was noted.  He has no mechanical symptoms.  He experiences difficulty  climbing steps and getting up from a chair due to discomfort in his knees.  His knee joint pain is exacerbated by walking prolonged distances.  He takes Tylenol as needed for pain relief.  Status post left foot surgery: Doing well.  He has no discomfort at  this time.  He wears proper fitting shoes.  Other fatigue - He continues to have chronic fatigue.  We will check a vitamin D level today.  Plan: VITAMIN D 25 Hydroxy (Vit-D Deficiency, Fractures)  Other insomnia: Overall he has been sleeping better at night.  Female-to-female transgender person  Family history of lupus erythematosus: 3 first cousins  Family history of polymyositis - Father-Plan: CK, Aldolase  Elevated CK -CK was 218 on 05/04/2019.  We will recheck CK and aldolase today.  He has a known family history of polymyositis-father.  He is continues to experience muscle tenderness and myalgias on a daily basis.  He recently went to physical therapy which helped and muscle strengthening but has not helped with his overall muscle tenderness.  Plan: CK, Aldolase  Myalgia -he has ongoing myalgias and muscle tenderness.  We will recheck CK, sed rate, vitamin D, and aldolase today.  Plan: CK, Sedimentation rate, VITAMIN D 25 Hydroxy (Vit-D Deficiency, Fractures), Aldolase  Orders: Orders Placed This Encounter  Procedures  . ANA  . Sjogrens syndrome-A extractable nuclear antibody  . CK  . Anti-DNA antibody, double-stranded  . C3 and C4  . Sedimentation rate  . VITAMIN D 25 Hydroxy (Vit-D Deficiency, Fractures)  . Urinalysis, Routine w reflex microscopic  . COMPLETE METABOLIC PANEL WITH GFR  . CBC with Differential/Platelet  . Aldolase  . Ambulatory referral to Ophthalmology   Meds ordered this encounter  Medications  . hydroxychloroquine (PLAQUENIL) 200 MG tablet    Sig: Take 1 tablet (200 mg total) by mouth 2 (two) times daily.    Dispense:  180 tablet    Refill:  0     Follow-Up Instructions: Return in about 5 months  (around 12/05/2019) for Positive ANA, Osteoarthritis.   Ofilia Neas, PA-C  Note - This record has been created using Dragon software.  Chart creation errors have been sought, but may not always  have been located. Such creation errors do not reflect on  the standard of medical care.

## 2019-06-30 ENCOUNTER — Other Ambulatory Visit: Payer: Self-pay

## 2019-06-30 ENCOUNTER — Ambulatory Visit: Payer: PRIVATE HEALTH INSURANCE | Admitting: Physical Therapy

## 2019-06-30 ENCOUNTER — Encounter: Payer: Self-pay | Admitting: Physical Therapy

## 2019-06-30 DIAGNOSIS — M25562 Pain in left knee: Secondary | ICD-10-CM

## 2019-06-30 DIAGNOSIS — M5440 Lumbago with sciatica, unspecified side: Secondary | ICD-10-CM | POA: Diagnosis not present

## 2019-06-30 DIAGNOSIS — G8929 Other chronic pain: Secondary | ICD-10-CM

## 2019-06-30 DIAGNOSIS — M25561 Pain in right knee: Secondary | ICD-10-CM

## 2019-06-30 DIAGNOSIS — R2689 Other abnormalities of gait and mobility: Secondary | ICD-10-CM

## 2019-06-30 DIAGNOSIS — M6281 Muscle weakness (generalized): Secondary | ICD-10-CM

## 2019-06-30 DIAGNOSIS — M25572 Pain in left ankle and joints of left foot: Secondary | ICD-10-CM

## 2019-06-30 DIAGNOSIS — R2681 Unsteadiness on feet: Secondary | ICD-10-CM

## 2019-06-30 DIAGNOSIS — M25571 Pain in right ankle and joints of right foot: Secondary | ICD-10-CM

## 2019-06-30 NOTE — Patient Instructions (Signed)
Sleep Tips    .Keep a consistent sleep schedule. Get up at the same time every day, even on weekends or during vacations. .Set a bedtime that is early enough for you to get at least 7 hours of sleep. .Don't go to bed unless you are sleepy.  .If you don't fall asleep after 20 minutes, get out of bed.  .Establish a relaxing bedtime routine.  .Use your bed only for sleep and sex.  .Make your bedroom quiet and relaxing. Keep the room at a comfortable, cool temperature.  .Limit exposure to bright light in the evenings. .Turn off electronic devices at least 30 minutes before bedtime. .Don't eat a large meal before bedtime. If you are hungry at night, eat a light, healthy snack.  .Exercise regularly and maintain a healthy diet.  Marland KitchenAvoid consuming caffeine in the late afternoon or evening.  .Avoid consuming alcohol before bedtime.  .Reduce your fluid intake before bedtime.  [name], PT Certified Exercise Expert for the Aging Adult  06/30/19 9:50 AM Phone: [phone] Fax: [phone]

## 2019-06-30 NOTE — Therapy (Signed)
[place], [place], [address] Phone: [phone]   Fax:  [phone]  Physical Therapy Treatment/Discharge Note  Patient Details  Name: Madeline Larsen MRN: [id number] Date of Birth: 02-13-1986 Referring Provider (PT): Flossie Buffy NP   Encounter Date: 06/30/2019  PT End of Session - 06/30/19 0941    Visit Number  7    Number of Visits  16    Date for PT Re-Evaluation  07/19/19    Authorization Type  PH[employer] Multi plan 2021 coresource    PT Start Time  0934    PT Stop Time  1015    PT Time Calculation (min)  41 min    Activity Tolerance  Patient tolerated treatment well    Behavior During Therapy  [facility] for tasks assessed/performed       Past Medical History:  Diagnosis Date  . Depression   . Female-to-female transgender person   . GERD (gastroesophageal reflux disease)   . MRSA (methicillin resistant staph aureus) culture positive     Past Surgical History:  Procedure Laterality Date  . FOOT SURGERY Left   . top surgery  02/2018    There were no vitals filed for this visit.  Subjective Assessment - 06/30/19 0938    Subjective  At home I have lift bar and weights to 100#,    Pertinent History  GERD, OA, HTN +ANA, female to female transgender,    Limitations  Lifting;Walking;House hold activities    How long can you sit comfortably?  unlimited    How long can you stand comfortably?  20-30 minutes    How long can you walk comfortably?  20 to 30 minutes    Diagnostic tests  MRI cervical Normal, mild degenerative changes    Patient Stated Goals  Pt goals is to get strength back and retrun to job to carry items    Currently in Pain?  Yes    Pain Score  0-No pain    Pain Location  Ankle    Pain Orientation  Right;Left    Pain Score  4    Pain Location  Back    Pain Orientation  Right;Left    Pain Descriptors / Indicators  Aching;Sore    Pain Type  Chronic pain         [facility] PT  Assessment - 06/30/19 0001      Assessment   Medical Diagnosis  Polyarthralgia myalgia, ataxia (gait disturbance)    Referring Provider (PT)  Nche, Charlene Brooke NP    Onset Date/Surgical Date  12/28/18   for  past 5 years     Functional Tests   Functional tests  Squat;Lunges;Single leg stance      Squat   Comments  pt able to squat with femur parallel and even distribution      Lunges   Comments  Pt able to get into a 1/2 kneeling position with 5/10 pain      Single Leg Stance   Comments  RT side 25 sec, LT 30 sec      AROM   Right Knee Extension  0    Right Knee Flexion  125    Left Knee Extension  0    Left Knee Flexion  121    Right Ankle Dorsiflexion  10    Left Ankle Dorsiflexion  13    Lumbar Flexion  90%   finger tip to toes   Lumbar Extension  75%  Lumbar - Right Side Bend  85%    Lumbar - Left Side Bend  85%    Lumbar - Right Rotation  WFL    Lumbar - Left Rotation  [facility]      Strength   Right Hip Flexion  4+/5    Right Hip Extension  4+/5    Right Hip External Rotation   4/5    Right Hip Internal Rotation  4/5    Right Hip ABduction  4/5    Left Hip Flexion  4/5    Left Hip Extension  4-/5    Left Hip External Rotation  4/5    Left Hip Internal Rotation  4-/5    Left Hip ABduction  4-/5    Right Knee Flexion  4/5    Right Knee Extension  4+/5    Left Knee Flexion  4/5    Left Knee Extension  4+/5    Right Ankle Dorsiflexion  4/5    Left Ankle Dorsiflexion  4/5                   [facility] Adult PT Treatment/Exercise - 06/30/19 0001      Self-Care   Self-Care  Other Self-Care Comments    Other Self-Care Comments   communtiy wellness and apps for phone  sleep tips      Exercises   Exercises  Lumbar      Lumbar Exercises: Stretches   Single Knee to Chest Stretch  5 reps;10 seconds    Quadruped Mid Back Stretch  5 reps;30 seconds    Quadruped Mid Back Stretch Limitations  one in middle, 2 to each side for QL stretch    Piriformis  Stretch  2 reps;10 seconds      Lumbar Exercises: Standing   Forward Lunge  10 reps;2 seconds   Each LE forward   Other Standing Lumbar Exercises  hip hinge x 30    Other Standing Lumbar Exercises  DL ladder  15#, 25#, 30# and 40 # KB  ladder 5 x each 3 rounds, then step ups for 1 min 3 rounds.  goblet squat with 25 # x 10 3 rounds      Lumbar Exercises: Supine   Pelvic Tilt  10 reps    Pelvic Tilt Limitations  3 sec    Bent Knee Raise  10 reps    Bent Knee Raise Limitations  c PPT, 2 sets       Lumbar Exercises: Quadruped   Madcat/Old Horse  10 reps    Straight Leg Raise  15 reps;3 seconds    Opposite Arm/Leg Raise  Right arm/Left leg;Left arm/Right leg;10 reps;3 seconds   imporved controlVC/TC   Other Quadruped Lumbar Exercises  The hundre table top 100x  5 increments breath in and breath out     Other Quadruped Lumbar Exercises  bird dog with knee taps  x 15 each alternating side             PT Education - 06/30/19 0950    Education Details  sleep hygiene written handout and reviewed HEP for home gym    Person(s) Educated  Patient    Methods  Explanation;Demonstration    Comprehension  Verbalized understanding;Returned demonstration       PT Short Term Goals - 06/21/19 1027      PT SHORT TERM GOAL #1   Title  Pt will be independent with intial HEP    Time  4    Period  Weeks    Status  Achieved    Target Date  06/21/19      PT SHORT TERM GOAL #2   Title  Pt will be able to flex bil knees to at least 120 degrees to be able to transfer with greater ease and decreased pain    Baseline  06-21-19 RT knee 125, LT 121    Time  4    Period  Weeks    Status  Achieved      PT SHORT TERM GOAL #3   Title  Pt will be able to ascend steps with alternating steps and UE support with pain 5/10 or less    Baseline  Pt was 10/10 in back and 9/10 in ankles today.  After RX pt 5/10 but not able to assess stairs    Time  4    Period  Weeks    Status  On-going    Target Date   06/21/19      PT SHORT TERM GOAL #4   Title  Pt will demonstrate squat with even wt distribution to show increased functional knee and ankle AROM    Baseline  Pt able to perform squat with femur parallel and even distribution    Time  4    Period  Weeks    Status  Achieved        PT Long Term Goals - 06/30/19 0945      PT LONG TERM GOAL #1   Title  Pt will be independent with  advanced HEP    Baseline  completed  and demo Advanced education on exercise will continue in home gym    Time  8    Period  Weeks    Status  Achieved      PT LONG TERM GOAL #2   Title  Pt will be able to ascend/descend steps with 3/10 pain and safety    Baseline  Pt able to carry 50 lb up and down 8 steps but with 6/10 pain    Time  8    Period  Weeks    Status  Partially Met      PT LONG TERM GOAL #3   Title  Pt will be able to lift 50 lb and walk 200 feet without exacerbating pain greater than 3/10 and with safety    Baseline  Pt able to carry 50 lb for 200 feet and up and down 8 steps  pain 6/10    Time  8    Period  Weeks    Status  Partially Met      PT LONG TERM GOAL #4   Title  Pt will be able to sleep using positioning and pain management techniques to achieved 4-5 hours of restful continuous sleep    Baseline  unable to sleep more than 3 hours due to pain  pt uses pain management strategies    Time  8    Period  Weeks    Status  Partially Met      PT LONG TERM GOAL #5   Title  Pt will be able to be consistent with exericise 4 x a week such home walking program    Baseline  Pt is diligent to perform 5-6 / week    Time  8    Period  Weeks    Status  Achieved            Plan - 06/30/19 1008    Clinical Impression Statement  Madeline Larsen has demonstrated good form and technique for HEP and lifting weights at home to continue strenthening.  See flow chart , MMT is at least 4- to 4+/5 in LE.  Pt is able to decrease pain with movement yet he maintains atleast 4/10 pain that keeps him  from getting continuous restorative sleep for longer than 3 hours.  He has been diligent to perform all exericises and has made good gains with goals but I recomment him to talk to his rheumalotlogist about better pain control available tohim with hi polyaathralgia mylagia.  He has an appt with his rheumatologist on April 27th.  will DC for now to HEP and weights at home and encourage him to have rheumatologist help him manage pain medicinally to get more than 3 hours of restorative sleep. Madeline Larsen has been an exmeplary patient.    Personal Factors and Comorbidities  Profession    Comorbidities  GERD, OA, HTN +ANA, obesity, female to female transgender, see chart for additional medical    Examination-Activity Limitations  Carry;Lift;Sleep;Locomotion Level;Stairs;Squat;Stand    Examination-Participation Restrictions  Cleaning;Laundry;Other    PT Frequency  2x / week    PT Duration  8 weeks    PT Treatment/Interventions  Cryotherapy;Electrical Stimulation;Iontophoresis 29m/ml Dexamethasone;Moist Heat;Ultrasound;Therapeutic exercise;Therapeutic activities;Functional mobility training;Stair training;Gait training;Neuromuscular re-education;Patient/family education;Passive range of motion;Manual techniques;Dry needling;Taping;Joint Manipulations    PT Next Visit [place] [id number] PPT and PPT c knee lifts, [id number]  [id number]    Consulted and Agree with Plan of Care  Patient       Patient will benefit from skilled therapeutic intervention in order to improve the following deficits and impairments:  Difficulty walking, Decreased mobility, Decreased range of motion, Decreased strength, Hypomobility, Postural dysfunction, Improper body mechanics, Pain, Obesity  Visit Diagnosis: Bilateral low back pain with sciatica, sciatica laterality unspecified, unspecified chronicity  Muscle weakness (generalized)  Chronic pain of left knee  Unsteadiness on feet  Chronic pain of right  knee  Other abnormalities of gait and mobility  Pain in left ankle and joints of left foot  Pain in right ankle and joints of right foot     Problem List Patient Active Problem List   Diagnosis Date Noted  . Leukopenia 05/26/2019  . S/P mastectomy, bilateral 05/04/2019  . Low TSH level 05/04/2019  . Hyperglycemia 05/04/2019  . Snoring 03/01/2019  . Essential hypertension 01/25/2019  . Chronic idiopathic constipation 01/04/2019  . GERD (gastroesophageal reflux disease) 12/28/2018  . Myalgia 12/16/2018  . Polyarthralgia 12/16/2018  . ANA positive 12/16/2018  . Neutropenia (HDyer 01/27/2018  . 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 Headache 06/18/2017  . 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 Status post left foot surgery 04/09/2015  . HAV (hallux abducto valgus) 09/05/2014   LVoncille Lo PT Certified Exercise Expert for the Aging Adult  06/30/19 4:10 PM Phone: [phone]: 3[facility]CM[facility]GStep[address] [place] [address]Phone: [phone]  Fax:  [phone] Name: KAMESHIA PEWITTMRN: [id number] of Birth: 1January 26, 1987 PHYSICAL THERAPY DISCHARGE SUMMARY  Visits from Start of Care: 7  Current functional level related to goals / functional outcomes: As above   Remaining deficits: Pain limited at least 4/10 or higher depending on rheumatological inflammation. Madeline MViramontescontinue to seek guidance by rheumatologist at future appt   Education / Equipment: HEP Plan: Patient agrees to discharge.  Patient goals were partially met. Patient is being  discharged due to meeting the stated rehab goals.  ?????    And being independent with HEP  Voncille Lo, PT Certified Exercise Expert for the Aging Adult  06/30/19 4:11 PM Phone: [phone] Fax: [phone]

## 2019-07-05 ENCOUNTER — Encounter: Payer: Self-pay | Admitting: Physician Assistant

## 2019-07-05 ENCOUNTER — Other Ambulatory Visit: Payer: Self-pay

## 2019-07-05 ENCOUNTER — Ambulatory Visit (INDEPENDENT_AMBULATORY_CARE_PROVIDER_SITE_OTHER): Payer: No Typology Code available for payment source | Admitting: Physician Assistant

## 2019-07-05 VITALS — BP 121/88 | HR 57 | Resp 16 | Ht 62.0 in | Wt 220.4 lb

## 2019-07-05 DIAGNOSIS — R748 Abnormal levels of other serum enzymes: Secondary | ICD-10-CM

## 2019-07-05 DIAGNOSIS — Z79899 Other long term (current) drug therapy: Secondary | ICD-10-CM | POA: Diagnosis not present

## 2019-07-05 DIAGNOSIS — G4709 Other insomnia: Secondary | ICD-10-CM

## 2019-07-05 DIAGNOSIS — M791 Myalgia, unspecified site: Secondary | ICD-10-CM

## 2019-07-05 DIAGNOSIS — R768 Other specified abnormal immunological findings in serum: Secondary | ICD-10-CM

## 2019-07-05 DIAGNOSIS — Z840 Family history of diseases of the skin and subcutaneous tissue: Secondary | ICD-10-CM

## 2019-07-05 DIAGNOSIS — Z8269 Family history of other diseases of the musculoskeletal system and connective tissue: Secondary | ICD-10-CM

## 2019-07-05 DIAGNOSIS — M170 Bilateral primary osteoarthritis of knee: Secondary | ICD-10-CM

## 2019-07-05 DIAGNOSIS — R5383 Other fatigue: Secondary | ICD-10-CM

## 2019-07-05 DIAGNOSIS — Z9889 Other specified postprocedural states: Secondary | ICD-10-CM

## 2019-07-05 DIAGNOSIS — F640 Transsexualism: Secondary | ICD-10-CM

## 2019-07-05 DIAGNOSIS — Z789 Other specified health status: Secondary | ICD-10-CM

## 2019-07-05 MED ORDER — HYDROXYCHLOROQUINE SULFATE 200 MG PO TABS: 200.0000 mg | ORAL_TABLET | Freq: Two times a day (BID) | ORAL | 0 refills | Status: DC

## 2019-07-06 ENCOUNTER — Other Ambulatory Visit: Payer: Self-pay

## 2019-07-06 DIAGNOSIS — E559 Vitamin D deficiency, unspecified: Secondary | ICD-10-CM

## 2019-07-06 MED ORDER — VITAMIN D (ERGOCALCIFEROL) 1.25 MG (50000 UNIT) PO CAPS: 50000.0000 [IU] | ORAL_CAPSULE | ORAL | 0 refills | Status: DC

## 2019-07-06 NOTE — Progress Notes (Signed)
Vitamin D is very low-9. Please notify patient and send in vitamin D 50,000 units by mouth twice weekly x3 months.  Recheck vitamin D in 3 months.

## 2019-07-07 ENCOUNTER — Other Ambulatory Visit: Payer: Self-pay

## 2019-07-07 ENCOUNTER — Ambulatory Visit: Payer: Self-pay | Admitting: Family Medicine

## 2019-07-07 ENCOUNTER — Telehealth: Payer: Self-pay | Admitting: *Deleted

## 2019-07-07 ENCOUNTER — Ambulatory Visit: Payer: No Typology Code available for payment source | Admitting: Endocrinology

## 2019-07-07 DIAGNOSIS — R531 Weakness: Secondary | ICD-10-CM

## 2019-07-07 DIAGNOSIS — Z79899 Other long term (current) drug therapy: Secondary | ICD-10-CM

## 2019-07-07 DIAGNOSIS — M791 Myalgia, unspecified site: Secondary | ICD-10-CM

## 2019-07-07 LAB — COMPLETE METABOLIC PANEL WITH GFR
AG Ratio: 1.5 (calc) (ref 1.0–2.5)
ALT: 22 U/L (ref 6–29)
AST: 26 U/L (ref 10–30)
Albumin: 4 g/dL (ref 3.6–5.1)
Alkaline phosphatase (APISO): 51 U/L (ref 31–125)
BUN: 8 mg/dL (ref 7–25)
CO2: 28 mmol/L (ref 20–32)
Calcium: 9.5 mg/dL (ref 8.6–10.2)
Chloride: 105 mmol/L (ref 98–110)
Creat: 0.67 mg/dL (ref 0.50–1.10)
GFR, Est African American: 132 mL/min/{1.73_m2} (ref >=60)
GFR, Est Non African American: 114 mL/min/{1.73_m2} (ref >=60)
Globulin: 2.7 g/dL (calc) (ref 1.9–3.7)
Glucose, Bld: 87 mg/dL (ref 65–99)
Potassium: 4.3 mmol/L (ref 3.5–5.3)
Sodium: 139 mmol/L (ref 135–146)
Total Bilirubin: 0.5 mg/dL (ref 0.2–1.2)
Total Protein: 6.7 g/dL (ref 6.1–8.1)

## 2019-07-07 LAB — CBC WITH DIFFERENTIAL/PLATELET
Absolute Monocytes: 245 cells/uL (ref 200–950)
Basophils Absolute: 10 cells/uL (ref 0–200)
Basophils Relative: 0.4 %
Eosinophils Absolute: 48 cells/uL (ref 15–500)
Eosinophils Relative: 2 %
HCT: 39.8 % (ref 35.0–45.0)
Hemoglobin: 12.7 g/dL (ref 11.7–15.5)
Lymphs Abs: 955 cells/uL (ref 850–3900)
MCH: 28 pg (ref 27.0–33.0)
MCHC: 31.9 g/dL — ABNORMAL LOW (ref 32.0–36.0)
MCV: 87.9 fL (ref 80.0–100.0)
MPV: 10.5 fL (ref 7.5–12.5)
Monocytes Relative: 10.2 %
Neutro Abs: 1142 cells/uL — ABNORMAL LOW (ref 1500–7800)
Neutrophils Relative %: 47.6 %
Platelets: 257 10*3/uL (ref 140–400)
RBC: 4.53 10*6/uL (ref 3.80–5.10)
RDW: 12.4 % (ref 11.0–15.0)
Total Lymphocyte: 39.8 %
WBC: 2.4 10*3/uL — ABNORMAL LOW (ref 3.8–10.8)

## 2019-07-07 LAB — URINALYSIS, ROUTINE W REFLEX MICROSCOPIC
Bilirubin Urine: NEGATIVE
Glucose, UA: NEGATIVE
Hgb urine dipstick: NEGATIVE
Ketones, ur: NEGATIVE
Leukocytes,Ua: NEGATIVE
Nitrite: NEGATIVE
Protein, ur: NEGATIVE
Specific Gravity, Urine: 1.02 (ref 1.001–1.03)
pH: 6 (ref 5.0–8.0)

## 2019-07-07 LAB — ANTI-NUCLEAR AB-TITER (ANA TITER)
ANA TITER: 1:320 {titer} — ABNORMAL HIGH
ANA Titer 1: 1:80 {titer} — ABNORMAL HIGH

## 2019-07-07 LAB — CK: Total CK: 363 U/L — ABNORMAL HIGH (ref 29–143)

## 2019-07-07 LAB — ANTI-DNA ANTIBODY, DOUBLE-STRANDED: ds DNA Ab: 7 IU/mL — ABNORMAL HIGH

## 2019-07-07 LAB — SJOGRENS SYNDROME-A EXTRACTABLE NUCLEAR ANTIBODY: SSA (Ro) (ENA) Antibody, IgG: >8 AI — AB

## 2019-07-07 LAB — C3 AND C4
C3 Complement: 120 mg/dL (ref 83–193)
C4 Complement: 31 mg/dL (ref 15–57)

## 2019-07-07 LAB — SEDIMENTATION RATE: Sed Rate: 6 mm/h (ref 0–20)

## 2019-07-07 LAB — VITAMIN D 25 HYDROXY (VIT D DEFICIENCY, FRACTURES): Vit D, 25-Hydroxy: 9 ng/mL — ABNORMAL LOW (ref 30–100)

## 2019-07-07 LAB — ANA: Anti Nuclear Antibody (ANA): POSITIVE — AB

## 2019-07-07 LAB — ALDOLASE: Aldolase: 5.6 U/L (ref <=8.1)

## 2019-07-07 NOTE — Telephone Encounter (Signed)
Orders placed. Will follow up pending results.

## 2019-07-07 NOTE — Telephone Encounter (Signed)
-----   Message from Shawnie Dapper, NP sent at 07/07/2019  2:04 PM EDT ----- Can you call this patient and see if he has had a recent NCS/EMG? I see that Dr Epimenio Foot mentioned ordering this in his note in October but I do not see it was done. I received labs from rheumatology and would advise we get NCS/EMG if not already performed.  ----- Message ----- From: Ellen Henri, CMA Sent: 07/07/2019   1:38 PM EDT To: Shawnie Dapper, NP

## 2019-07-07 NOTE — Telephone Encounter (Signed)
I called and relayed to pt to let him know that AL/NP did order the [place]/EMG test.  He will get a call to set up.  He verbalized understanding.

## 2019-07-07 NOTE — Telephone Encounter (Signed)
I called pt spoke to him and he states that he has not had a [place]/EMG study done.

## 2019-07-07 NOTE — Progress Notes (Signed)
ANA, Ro antibody, and dsDNA remain positive but stable. Complements and ESR WNL.  Continue taking PLQ as prescribed.  CBC is low-2.4 please advise patient to return in 1 month to recheck CBC.   CMP WNL. UA normal.    CK remains elevated and is trending up. Aldolase WNL.  Please notify patient and refer to neurology for further evaluation.

## 2019-07-12 ENCOUNTER — Other Ambulatory Visit: Payer: Self-pay

## 2019-07-12 ENCOUNTER — Encounter: Payer: Self-pay | Admitting: Endocrinology

## 2019-07-12 ENCOUNTER — Ambulatory Visit (INDEPENDENT_AMBULATORY_CARE_PROVIDER_SITE_OTHER): Payer: No Typology Code available for payment source | Admitting: Endocrinology

## 2019-07-12 VITALS — BP 128/78 | HR 60 | Ht 62.0 in | Wt 225.0 lb

## 2019-07-12 DIAGNOSIS — R7989 Other specified abnormal findings of blood chemistry: Secondary | ICD-10-CM

## 2019-07-12 LAB — T4, FREE: Free T4: 0.62 ng/dL (ref 0.60–1.60)

## 2019-07-12 LAB — TSH: TSH: 0.81 u[IU]/mL (ref 0.35–4.50)

## 2019-07-12 MED ORDER — TESTOSTERONE CYPIONATE 200 MG/ML IM SOLN: 80.0000 mg | INTRAMUSCULAR | 0 refills | Status: DC

## 2019-07-12 NOTE — Progress Notes (Signed)
Subjective:    Patient ID: Madeline Larsen, adult    DOB: 05-01-1985, 34 y.o.   MRN: [id number]  HPI Pt returns for f/u of transgender state (F to M): Surgery: bilat mastectomies (Dr Festus Barren, in [place], [place]) in 2019 Medication: IM testosterone since 2015 Counseling: none recently Other: none Interval hx:  He has not recently takes depo-testosterone, as he has run out.  pt states he feels well in general.   Hyperthyroidism (dx'ed 2020; he has never been on rx for this; f/u TSH was normal).   Past Medical History:  Diagnosis Date  . Depression   . Female-to-female transgender person   . GERD (gastroesophageal reflux disease)   . MRSA (methicillin resistant staph aureus) culture positive     Past Surgical History:  Procedure Laterality Date  . FOOT SURGERY Left   . top surgery  02/2018    Social History   Socioeconomic History  . Marital status: Married    Spouse name: Not on file  . Number of children: 2  . Years of education: Not on file  . Highest education level: High school graduate  Occupational History  . Not on file  Tobacco Use  . Smoking status: Never Smoker  . Smokeless tobacco: Never Used  Substance and Sexual Activity  . Alcohol use: Yes    Alcohol/week: 0.0 standard drinks    Comment: socially  . Drug use: Not Currently    Types: Marijuana  . Sexual activity: Yes    Birth control/protection: None    Comment: female partner  Other Topics Concern  . Not on file  Social History Narrative   Lives at home with wife & kids   Right handed   Social Determinants of Health   Financial Resource Strain:   . Difficulty of Paying Living Expenses:   Food Insecurity:   . Worried About Programme researcher, broadcasting/film/video in the Last Year:   . Barista in the Last Year:   Transportation Needs:   . Freight forwarder (Medical):   Marland Kitchen Lack of Transportation (Non-Medical):   Physical Activity:   . Days of Exercise per Week:   . Minutes of Exercise per Session:    Stress:   . Feeling of Stress :   Social Connections:   . Frequency of Communication with Friends and Family:   . Frequency of Social Gatherings with Friends and Family:   . Attends Religious Services:   . Active Member of Clubs or Organizations:   . Attends Banker Meetings:   Marland Kitchen Marital Status:   Intimate Partner Violence:   . Fear of Current or Ex-Partner:   . Emotionally Abused:   Marland Kitchen Physically Abused:   . Sexually Abused:     Current Outpatient Medications on File Prior to Visit  Medication Sig Dispense Refill  . amLODipine (NORVASC) 10 MG tablet Take 1 tablet (10 mg total) by mouth at bedtime. 90 tablet 1  . DULoxetine (CYMBALTA) 20 MG capsule TAKE 1 CAPSULE BY MOUTH TWICE A DAY 180 capsule 2  . etodolac (LODINE) 400 MG tablet Take 1 tablet (400 mg total) by mouth 2 (two) times daily. 60 tablet 5  . gabapentin (NEURONTIN) 100 MG capsule Two capsules po qAM; two po qPM and two po qHS 180 capsule 5  . hydroxychloroquine (PLAQUENIL) 200 MG tablet Take 1 tablet (200 mg total) by mouth 2 (two) times daily. 180 tablet 0  . lubiprostone (AMITIZA) 8 MCG capsule Take 3 capsules (  24 mcg total) by mouth daily. 90 capsule 5  . olmesartan (BENICAR) 5 MG tablet Take 2 tablets (10 mg total) by mouth every morning. 180 tablet 1  . ondansetron (ZOFRAN ODT) 8 MG disintegrating tablet Take 1 tablet (8 mg total) by mouth every 8 (eight) hours as needed for nausea or vomiting. 30 tablet 0  . Vitamin D, Ergocalciferol, (DRISDOL) 1.25 MG (50000 UNIT) CAPS capsule Take 1 capsule (50,000 Units total) by mouth 2 (two) times a week. 24 capsule 0   No current facility-administered medications on file prior to visit.    No Known Allergies  Family History  Problem Relation Age of Onset  . Alcohol abuse Mother   . Cancer Mother 51       Uterine cancer  . Drug abuse Mother   . HIV/AIDS Mother   . Early death Mother 44       uterine cancer  . Alcohol abuse Father   . Drug abuse Father    . Polymyositis Father 76       deceased at age 77  . Early death Father 53       polymyositis  . Alcohol abuse Sister   . Depression Sister   . Drug abuse Sister   . Depression Brother   . Kidney disease Maternal Grandmother        ESRD on dialysis  . Hypertension Maternal Grandmother   . Rheum arthritis Paternal Grandmother   . Rheum arthritis Brother   . Depression Sister   . Depression Sister   . Depression Sister   . Lupus Cousin   . Lupus Cousin   . Lupus Cousin   . Prostate cancer Paternal Uncle     BP 128/78   Pulse 60   Ht 5\' 2"  (1.575 m)   Wt 225 lb (102.1 kg)   SpO2 98%   BMI 41.15 kg/m    Review of Systems No change in chronic ankle swelling.      Objective:   Physical Exam VITAL SIGNS:  See vs page GENERAL: no distress NECK: There is no palpable thyroid enlargement.  No thyroid nodule is palpable.  No palpable lymphadenopathy at the anterior neck.      Assessment & Plan:  transgender state: he has been without testosterone, as he has been waiting until refill is due.  As he is due now, I have refilled.  Hyperthyroidism: recheck today  Patient Instructions  Blood tests are requested for you today.  We'll let you know about the results.  I have sent a prescription to your pharmacy, to refill the testosterone.   To schedule your appointment at [facility], please call [phone] [facility].  Please come back for a follow-up appointment in 3 months.

## 2019-07-12 NOTE — Patient Instructions (Signed)
Blood tests are requested for you today.  We'll let you know about the results.  I have sent a prescription to your pharmacy, to refill the testosterone.   To schedule your appointment at [facility], please call [phone] [phone].  Please come back for a follow-up appointment in 3 months.

## 2019-07-27 ENCOUNTER — Other Ambulatory Visit: Payer: Self-pay | Admitting: Endocrinology

## 2019-07-27 DIAGNOSIS — Z789 Other specified health status: Secondary | ICD-10-CM

## 2019-08-02 ENCOUNTER — Encounter: Payer: No Typology Code available for payment source | Admitting: Neurology

## 2019-08-02 ENCOUNTER — Ambulatory Visit (INDEPENDENT_AMBULATORY_CARE_PROVIDER_SITE_OTHER): Payer: No Typology Code available for payment source | Admitting: Neurology

## 2019-08-02 ENCOUNTER — Other Ambulatory Visit: Payer: Self-pay

## 2019-08-02 DIAGNOSIS — R200 Anesthesia of skin: Secondary | ICD-10-CM | POA: Diagnosis not present

## 2019-08-02 DIAGNOSIS — R531 Weakness: Secondary | ICD-10-CM

## 2019-08-02 DIAGNOSIS — M791 Myalgia, unspecified site: Secondary | ICD-10-CM

## 2019-08-02 DIAGNOSIS — Z0289 Encounter for other administrative examinations: Secondary | ICD-10-CM

## 2019-08-02 NOTE — Progress Notes (Signed)
Full Name: Jodelle Fausto Gender: Female MRN #: [id number] Date of Birth: [birth year]    Visit Date: 08/02/2019 08:30 Age: 34 Years Examining Physician: Arlice Colt, MD  Referring Physician: Debbora Presto, NP Height: 5 feet 2 inch     History: Mr. Jergens is a 34 year old female reporting muscle aches, weakness and numbness.  On exam, strength was normal.  DTRs were normal.  Creatinine kinase has been elevated, 363 U/L (less than 143 is normal)  Nerve conduction studies:  The right median, ulnar, peroneal and tibial motor responses had normal distal latencies, amplitudes and conduction velocities.  The tibial and ulnar F-wave responses had normal latencies.  The sural, superficial, median and ulnar sensory responses had normal peak latencies and amplitudes.  Electromyography: Selected muscles of the right arm and leg was evaluated with needle EMG.  Motor unit morphology and recruitment was normal in all of the muscles tested.  There was no abnormal spontaneous activity.  Impression: This is a normal EMG/NCV study.  There is no evidence of neuropathy or myopathy.  Jackie Russman A. Felecia Shelling, MD, PhD, FAAN Certified in Neurology, Clinical Neurophysiology, Sleep Medicine, Pain Medicine and Neuroimaging Director, [place] [facility] [address], [place], [place] [address] [phone]     Verbal informed consent was obtained from the patient, patient was informed of potential risk of procedure, including bruising, bleeding, hematoma formation, infection, muscle weakness, muscle pain, numbness, among others.         [place]    Nerve / Sites Muscle Latency Ref. Amplitude Ref. Rel Amp Segments Distance Velocity Ref. Area    ms ms mV mV %  cm m/s m/s mVms  R Median - APB     Wrist APB 3.1 ?4.4 12.0 ?4.0 100 Wrist - APB 7   39.8     Upper arm APB 6.8  11.5  95.8 Upper arm - Wrist 21 57 ?49 37.5  R Ulnar - ADM       Wrist ADM 2.4 ?3.3 12.9 ?6.0 100 Wrist - ADM 7   35.9     B.Elbow ADM 5.5  11.7  90.6 B.Elbow - Wrist 21 67 ?49 34.9     A.Elbow ADM 7.1  11.2  95.8 A.Elbow - B.Elbow 10 63 ?49 34.9         A.Elbow - Wrist      R Peroneal - EDB     Ankle EDB 3.5 ?6.5 5.8 ?2.0 100 Ankle - EDB 9   12.8     Fib head EDB 8.7  5.6  96 Fib head - Ankle 26 51 ?44 13.0     Pop fossa EDB 10.4  6.4  114 Pop fossa - Fib head 10 58 ?44 15.2         Pop fossa - Ankle      R Tibial - AH     Ankle AH 3.5 ?5.8 5.9 ?4.0 100 Ankle - AH 9   10.0     Pop fossa AH 11.4  4.0  67.5 Pop fossa - Ankle 34 43 ?41 7.5             SNC    Nerve / Sites Rec. Site Peak Lat Ref.  Amp Ref. Segments Distance Peak Diff Ref.    ms ms V V  cm ms ms  R Sural - Ankle (Calf)     Calf Ankle 3.6 ?4.4 7 ?6 Calf -  Ankle 14    R Superficial peroneal - Ankle     Lat leg Ankle 3.5 ?4.4 7 ?6 Lat leg - Ankle 14    R Median, Ulnar - Transcarpal comparison     Median Palm Wrist 2.0 ?2.2 63 ?35 Median Palm - Wrist 8       Ulnar Palm Wrist 1.6 ?2.2 27 ?12 Ulnar Palm - Wrist 8          Median Palm - Ulnar Palm  0.4 ?0.4  R Median - Orthodromic (Dig II, Mid palm)     Dig II Wrist 3.4 ?3.4 12 ?10 Dig II - Wrist 13    R Ulnar - Orthodromic, (Dig V, Mid palm)     Dig V Wrist 2.4 ?3.1 9 ?5 Dig V - Wrist 59                 F  Wave    Nerve F Lat Ref.   ms ms  R Tibial - AH 42.0 ?56.0  R Ulnar - ADM 25.0 ?32.0         EMG Summary Table    Spontaneous MUAP Recruitment  Muscle IA Fib PSW Fasc Other Amp Dur. Poly Pattern  R. Deltoid Normal None None None _______ Normal Normal Normal Normal  R. Triceps brachii Normal None None None _______ Normal Normal Normal Normal  R. Biceps brachii Normal None None None _______ Normal Normal Normal Normal  R. Extensor digitorum communis Normal None None None _______ Normal Normal Normal Normal  R. Flexor carpi ulnaris Normal None None None _______ Normal Normal Normal Normal  R. First dorsal interosseous  Normal None None None _______ Normal Normal Normal Normal  R. Iliopsoas Normal None None None _______ Normal Normal Normal Normal  R. Vastus medialis Normal None None None _______ Normal Normal Normal Normal  R. Gluteus medius Normal None None None _______ Normal Normal Normal Normal  R. Peroneus longus Normal None None None _______ Normal Normal Normal Normal  R. Gastrocnemius (Medial head) Normal None None None _______ Normal Normal Normal Normal  R. Tibialis anterior Normal None None None _______ Normal Normal Normal Normal

## 2019-08-04 ENCOUNTER — Telehealth: Payer: Self-pay | Admitting: Nurse Practitioner

## 2019-08-23 ENCOUNTER — Telehealth: Payer: Self-pay | Admitting: Nurse Practitioner

## 2019-09-26 ENCOUNTER — Telehealth: Payer: Self-pay | Admitting: Nurse Practitioner

## 2019-09-26 NOTE — Telephone Encounter (Signed)
Called patient to reschedule canceled appointment on 08/23/2019. Left voicemail to give the office a call back to reschedule.

## 2019-10-18 ENCOUNTER — Ambulatory Visit: Payer: No Typology Code available for payment source | Admitting: Endocrinology

## 2019-11-03 ENCOUNTER — Other Ambulatory Visit: Payer: Self-pay

## 2019-11-04 ENCOUNTER — Encounter: Payer: Self-pay | Admitting: Nurse Practitioner

## 2019-11-04 ENCOUNTER — Ambulatory Visit: Payer: BC Managed Care – PPO | Admitting: Nurse Practitioner

## 2019-11-04 VITALS — BP 116/84 | HR 62 | Temp 96.9°F | Ht 62.0 in | Wt 232.8 lb

## 2019-11-04 DIAGNOSIS — I10 Essential (primary) hypertension: Secondary | ICD-10-CM

## 2019-11-04 DIAGNOSIS — M791 Myalgia, unspecified site: Secondary | ICD-10-CM | POA: Diagnosis not present

## 2019-11-04 DIAGNOSIS — M2550 Pain in unspecified joint: Secondary | ICD-10-CM | POA: Diagnosis not present

## 2019-11-04 MED ORDER — OLMESARTAN MEDOXOMIL 5 MG PO TABS: 10.0000 mg | ORAL_TABLET | ORAL | 3 refills | Status: DC

## 2019-11-04 MED ORDER — DULOXETINE HCL 30 MG PO CPEP: 30.0000 mg | ORAL_CAPSULE | Freq: Two times a day (BID) | ORAL | 2 refills | Status: DC

## 2019-11-04 MED ORDER — AMLODIPINE BESYLATE 10 MG PO TABS: 10.0000 mg | ORAL_TABLET | Freq: Every day | ORAL | 3 refills | Status: DC

## 2019-11-04 NOTE — Assessment & Plan Note (Signed)
BP at goal with amlodipine and losartan BP Readings from Last 3 Encounters:  11/04/19 116/84  07/12/19 128/78  07/05/19 121/88   Continue current medications. Refills sent

## 2019-11-04 NOTE — Assessment & Plan Note (Signed)
Chronic, persistent muscle and joint pain despite use of Cymbalta and gabapentin. Worse with activity, interferes with sleep. Has completed PT with dry needling, minimal improvement Sleep did not improve with benadryl and melatonin.  Increased cymbalta to 30mg  BID Try yoga and meditation or acupuncture or massage therapy. Entered referral to [facility] per patient's request. Use CBD oil or gummy to help with pain and sleep. F/up in 44months

## 2019-11-04 NOTE — Progress Notes (Signed)
Subjective:  Patient ID: Madeline Larsen, adult    DOB: Mar 31, 1985  Age: 34 y.o. MRN: [id number]  CC: Acute Visit (Pt c/o pain even after completing PT, and having issues sleeping for only 2-3 hours a night. Pt takes otc tylenolol pm and it will put him to sleep but he can't stay sleep)   HPI  Myalgia Chronic, persistent muscle and joint pain despite use of Cymbalta and gabapentin. Worse with activity, interferes with sleep. Has completed PT with dry needling, minimal improvement Sleep did not improve with benadryl and melatonin.  Increased cymbalta to 30mg  BID Try yoga and meditation or acupuncture or massage therapy. Entered referral to [facility] per patient's request. Use CBD oil or gummy to help with pain and sleep. F/up in 48months  Essential hypertension BP at goal with amlodipine and losartan BP Readings from Last 3 Encounters:  11/04/19 116/84  07/12/19 128/78  07/05/19 121/88   Continue current medications. Refills sent   Reviewed past Medical, Social and Family history today.  Outpatient Medications Prior to Visit  Medication Sig Dispense Refill  . etodolac (LODINE) 400 MG tablet Take 1 tablet (400 mg total) by mouth 2 (two) times daily. 60 tablet 5  . gabapentin (NEURONTIN) 100 MG capsule Two capsules po qAM; two po qPM and two po qHS 180 capsule 5  . hydroxychloroquine (PLAQUENIL) 200 MG tablet Take 1 tablet (200 mg total) by mouth 2 (two) times daily. 180 tablet 0  . lubiprostone (AMITIZA) 8 MCG capsule Take 3 capsules (24 mcg total) by mouth daily. 90 capsule 5  . ondansetron (ZOFRAN ODT) 8 MG disintegrating tablet Take 1 tablet (8 mg total) by mouth every 8 (eight) hours as needed for nausea or vomiting. 30 tablet 0  . testosterone cypionate (DEPOTESTOSTERONE CYPIONATE) 200 MG/ML injection Inject 0.4 mLs (80 mg total) into the muscle once a week. And syringes 1/week 10 mL 0  . Vitamin D, Ergocalciferol, (DRISDOL) 1.25 MG (50000 UNIT) CAPS capsule  Take 1 capsule (50,000 Units total) by mouth 2 (two) times a week. 24 capsule 0  . amLODipine (NORVASC) 10 MG tablet Take 1 tablet (10 mg total) by mouth at bedtime. 90 tablet 1  . DULoxetine (CYMBALTA) 20 MG capsule TAKE 1 CAPSULE BY MOUTH TWICE A DAY 180 capsule 2  . olmesartan (BENICAR) 5 MG tablet Take 2 tablets (10 mg total) by mouth every morning. 180 tablet 1   No facility-administered medications prior to visit.    ROS See HPI  Objective:  BP 116/84 (BP Location: Left Arm, Patient Position: Sitting, Cuff Size: Normal)   Pulse 62   Temp (!) 96.9 F (36.1 C) (Temporal)   Ht 5\' 2"  (1.575 m)   Wt 232 lb 12.8 oz (105.6 kg)   SpO2 100%   BMI 42.58 kg/m   Physical Exam Constitutional:      Appearance: He is obese.  Cardiovascular:     Rate and Rhythm: Normal rate and regular rhythm.     Pulses: Normal pulses.     Heart sounds: Normal heart sounds.  Pulmonary:     Effort: Pulmonary effort is normal.     Breath sounds: Normal breath sounds.  Abdominal:     General: Bowel sounds are normal.     Palpations: Abdomen is soft.  Musculoskeletal:     Right lower leg: No edema.     Left lower leg: No edema.  Neurological:     Mental Status: He is alert and oriented to  person, place, and time.  Psychiatric:        Attention and Perception: Attention normal.        Mood and Affect: Mood is depressed.        Speech: Speech normal.        Behavior: Behavior is cooperative.        Thought Content: Thought content normal.        Cognition and Memory: Cognition normal.        Judgment: Judgment normal.    Assessment & Plan:  This visit occurred during the SARS-CoV-2 public health emergency.  Safety protocols were in place, including screening questions prior to the visit, additional usage of staff PPE, and extensive cleaning of exam room while observing appropriate contact time as indicated for disinfecting solutions.   Madeline Larsen was seen today for acute visit.  Diagnoses and all  orders for this visit:  Myalgia -     DULoxetine (CYMBALTA) 30 MG capsule; Take 1 capsule (30 mg total) by mouth 2 (two) times daily. -     Ambulatory referral to Pain Clinic  Essential hypertension -     olmesartan (BENICAR) 5 MG tablet; Take 2 tablets (10 mg total) by mouth every morning. -     amLODipine (NORVASC) 10 MG tablet; Take 1 tablet (10 mg total) by mouth at bedtime.  Polyarthralgia -     DULoxetine (CYMBALTA) 30 MG capsule; Take 1 capsule (30 mg total) by mouth 2 (two) times daily. -     Ambulatory referral to Pain Clinic    Problem List Items Addressed This Visit      Cardiovascular and Mediastinum   Essential hypertension    BP at goal with amlodipine and losartan BP Readings from Last 3 Encounters:  11/04/19 116/84  07/12/19 128/78  07/05/19 121/88   Continue current medications. Refills sent      Relevant Medications   olmesartan (BENICAR) 5 MG tablet   amLODipine (NORVASC) 10 MG tablet     Other   Myalgia - Primary    Chronic, persistent muscle and joint pain despite use of Cymbalta and gabapentin. Worse with activity, interferes with sleep. Has completed PT with dry needling, minimal improvement Sleep did not improve with benadryl and melatonin.  Increased cymbalta to 30mg  BID Try yoga and meditation or acupuncture or massage therapy. Entered referral to [facility] per patient's request. Use CBD oil or gummy to help with pain and sleep. F/up in 9months      Relevant Medications   DULoxetine (CYMBALTA) 30 MG capsule   Other Relevant Orders   Ambulatory referral to Pain Clinic   Polyarthralgia   Relevant Medications   DULoxetine (CYMBALTA) 30 MG capsule   Other Relevant Orders   Ambulatory referral to Pain Clinic      Follow-up: Return in about 3 months (around 02/04/2020) for HTN and chronic pain (F2F, 02/06/2020).  , NP

## 2019-11-04 NOTE — Patient Instructions (Signed)
Increase cymbalta to 30mg  BID, nrw prescription sent Try yoga and meditation or acupuncture or massage therapy. Let me know which pain clinic is under your insurance plan. Use CBD oil or gummy to help with pain and sleep.

## 2019-11-10 ENCOUNTER — Encounter: Payer: Self-pay | Admitting: Family Medicine

## 2019-11-10 ENCOUNTER — Ambulatory Visit: Payer: No Typology Code available for payment source | Admitting: Family Medicine

## 2019-11-22 NOTE — Progress Notes (Deleted)
Office Visit Note  Patient: Madeline Larsen             Date of Birth: 12/10/1985           MRN: [id number]             PCP: Anne Ng, NP Referring: Anne Ng, NP Visit Date: 12/06/2019 Occupation: @GUAROCC @  Subjective:  No chief complaint on file.   History of Present Illness: Madeline Larsen is a 34 y.o. adult ***   Activities of Daily Living:  Patient reports morning stiffness for *** {minute/hour:19697}.   Patient {ACTIONS;DENIES/REPORTS:21021675::"Denies"} nocturnal pain.  Difficulty dressing/grooming: {ACTIONS;DENIES/REPORTS:21021675::"Denies"} Difficulty climbing stairs: {ACTIONS;DENIES/REPORTS:21021675::"Denies"} Difficulty getting out of chair: {ACTIONS;DENIES/REPORTS:21021675::"Denies"} Difficulty using hands for taps, buttons, cutlery, and/or writing: {ACTIONS;DENIES/REPORTS:21021675::"Denies"}  No Rheumatology ROS completed.   PMFS History:  Patient Active Problem List   Diagnosis Date Noted  . Leukopenia 05/26/2019  . S/P mastectomy, bilateral 05/04/2019  . Low TSH level 05/04/2019  . Hyperglycemia 05/04/2019  . Essential hypertension 01/25/2019  . Chronic idiopathic constipation 01/04/2019  . GERD (gastroesophageal reflux disease) 12/28/2018  . Myalgia 12/16/2018  . Polyarthralgia 12/16/2018  . ANA positive 12/16/2018  . Neutropenia (HCC) 01/27/2018  . 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 Headache 06/18/2017  . 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 Status post left foot surgery 04/09/2015  . 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 Depression   . Female-to-female transgender person   . GERD (gastroesophageal reflux disease)   . MRSA (methicillin resistant staph aureus) culture positive     Family History  Problem Relation Age of Onset  . Alcohol abuse Mother   . Cancer Mother 37       Uterine cancer  . Drug abuse Mother   . HIV/AIDS Mother   . Early death Mother 14         uterine cancer  . Alcohol abuse Father   . Drug abuse Father   . Polymyositis Father 19       deceased at age 7  . Early death Father 60       polymyositis  . Alcohol abuse Sister   . Depression Sister   . Drug abuse Sister   . Depression Brother   . Kidney disease Maternal Grandmother        ESRD on dialysis  . Hypertension Maternal Grandmother   . Rheum arthritis Paternal Grandmother   . Rheum arthritis Brother   . Depression Sister   . Depression Sister   . Depression Sister   . Lupus Cousin   . Lupus Cousin   . Lupus Cousin   . Prostate cancer Paternal Uncle    Past Surgical History:  Procedure Laterality Date  . FOOT SURGERY Left   . top surgery  02/2018   Social History   Social History Narrative   Lives at home with wife & kids   Right handed   Immunization History  Administered Date(s) Administered  . Tdap 01/26/2018     Objective: Vital Signs: There were no vitals taken for this visit.   Physical Exam   Musculoskeletal Exam: ***  CDAI Exam: CDAI Score: -- Patient Global: --; Provider Global: -- Swollen: --; Tender: -- Joint Exam 12/06/2019   No joint exam has been documented for this visit   There is currently no information documented on the homunculus. Go to the Rheumatology activity and complete the homunculus joint exam.  Investigation: No additional findings.  Imaging: No results found.  Recent Labs: Lab Results  Component Value Date   WBC 2.4 (L) 07/05/2019   HGB 12.7 07/05/2019   PLT 257 07/05/2019   NA 139 07/05/2019   K 4.3 07/05/2019   CL 105 07/05/2019   CO2 28 07/05/2019   GLUCOSE 87 07/05/2019   BUN 8 07/05/2019   CREATININE 0.67 07/05/2019   BILITOT 0.5 07/05/2019   ALKPHOS 63 06/07/2019   AST 26 07/05/2019   ALT 22 07/05/2019   PROT 6.7 07/05/2019   ALBUMIN 4.0 06/07/2019   CALCIUM 9.5 07/05/2019   GFRAA 132 07/05/2019    Speciality Comments: No specialty comments available.  Procedures:  No  procedures performed Allergies: Patient has no known allergies.   Assessment / Plan:     Visit Diagnoses: No diagnosis found.  Orders: No orders of the defined types were placed in this encounter.  No orders of the defined types were placed in this encounter.   Face-to-face time spent with patient was *** minutes. Greater than 50% of time was spent in counseling and coordination of care.  Follow-Up Instructions: No follow-ups on file.   Ellen Henri, CMA  Note - This record has been created using Animal nutritionist.  Chart creation errors have been sought, but may not always  have been located. Such creation errors do not reflect on  the standard of medical care.

## 2019-12-06 ENCOUNTER — Inpatient Hospital Stay: Payer: BC Managed Care – PPO | Admitting: Hematology

## 2019-12-06 ENCOUNTER — Ambulatory Visit: Payer: No Typology Code available for payment source | Admitting: Physician Assistant

## 2019-12-06 ENCOUNTER — Inpatient Hospital Stay: Payer: BC Managed Care – PPO

## 2019-12-13 NOTE — Progress Notes (Signed)
Office Visit Note  Patient: Madeline Larsen             Date of Birth: August 20, 1985           MRN: [id number]             PCP: Flossie Buffy, NP Referring: Flossie Buffy, NP Visit Date: 12/27/2019 Occupation: _0 @  Subjective:  Fatigue   History of Present Illness: Madeline Larsen is a 34 y.o. adult with history of positive ANA and osteoarthritis.  Patient is taking Plaquenil 200 mg 1 tablet by mouth twice daily.  He continues to tolerate Plaquenil without any side effects.  He has not missed any doses of Plaquenil recently.  He denies any signs or symptoms of a flare recently.  He continues to have chronic pain in both knee joints and both ankle joints.  He states he notices occasional swelling in his knee joints.  He takes Tylenol arthritis for pain relief.  He continues to have generalized myalgias and muscle tenderness.  He will be establishing with pain management today.  He states that he recently increased the dose of Cymbalta from 30 mg to 60 mg daily and has noticed some improvement in his level of fatigue.  He continues to have difficulty sleeping at night and only sleeps 4 to 5 hours per night.  He has been taking gabapentin as prescribed. He denies any recent rashes, oral or nasal ulcerations, symptoms of Raynaud's, enlarged lymph nodes, chest pain, or shortness of breath.  He continues to have chronic sicca symptoms and purchased a humidifier.  He is also been using refresh eyedrops as needed for symptomatic relief.  He completed the prescription for vitamin D 50,000 units twice weekly about 2 weeks ago.     Activities of Daily Living:  Patient reports morning stiffness for 1 hour.   Patient Reports nocturnal pain.  Difficulty dressing/grooming: Denies Difficulty climbing stairs: Reports Difficulty getting out of chair: Reports Difficulty using hands for taps, buttons, cutlery, and/or writing: Denies  Review of Systems  Constitutional: Negative for fatigue.    HENT: Positive for mouth dryness and nose dryness. Negative for mouth sores.   Eyes: Positive for dryness. Negative for pain and itching.  Respiratory: Negative for shortness of breath and difficulty breathing.   Cardiovascular: Negative for chest pain and palpitations.  Gastrointestinal: Positive for constipation. Negative for blood in stool and diarrhea.  Endocrine: Negative for increased urination.  Genitourinary: Negative for difficulty urinating and painful urination.  Musculoskeletal: Positive for arthralgias, joint pain, joint swelling, myalgias, morning stiffness, muscle tenderness and myalgias.  Skin: Negative for color change, rash and redness.  Allergic/Immunologic: Negative for susceptible to infections.  Neurological: Positive for numbness. Negative for dizziness, headaches, memory loss and weakness.  Hematological: Positive for bruising/bleeding tendency.  Psychiatric/Behavioral: Positive for sleep disturbance. Negative for confusion.    PMFS History:  Patient Active Problem List   Diagnosis Date Noted  . Leukopenia 05/26/2019  . S/P mastectomy, bilateral 05/04/2019  . Low TSH level 05/04/2019  . Hyperglycemia 05/04/2019  . Essential hypertension 01/25/2019  . Chronic idiopathic constipation 01/04/2019  . GERD (gastroesophageal reflux disease) 12/28/2018  . Myalgia 12/16/2018  . Polyarthralgia 12/16/2018  . ANA positive 12/16/2018  . Neutropenia ([place]) 01/27/2018  . 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 Headache 06/18/2017  . 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 Status post left foot surgery 04/09/2015  . 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  Depression   . Female-to-female transgender person   . GERD (gastroesophageal reflux disease)   . MRSA (methicillin resistant staph aureus) culture positive     Family History  Problem Relation Age of Onset  . Alcohol abuse Mother   . Cancer Mother 63        Uterine cancer  . Drug abuse Mother   . HIV/AIDS Mother   . Early death Mother 64       uterine cancer  . Alcohol abuse Father   . Drug abuse Father   . Polymyositis Father 65       deceased at age 40  . Early death Father 55       polymyositis  . Alcohol abuse Sister   . Depression Sister   . Drug abuse Sister   . Depression Brother   . Kidney disease Maternal Grandmother        ESRD on dialysis  . Hypertension Maternal Grandmother   . Rheum arthritis Paternal Grandmother   . Rheum arthritis Brother   . Depression Sister   . Depression Sister   . Depression Sister   . Lupus Cousin   . Lupus Cousin   . Lupus Cousin   . Prostate cancer Paternal Uncle    Past Surgical History:  Procedure Laterality Date  . FOOT SURGERY Left   . top surgery  02/2018   Social History   Social History Narrative   Lives at home with wife & kids   Right handed   Immunization History  Administered Date(s) Administered  . PFIZER SARS-COV-2 Vaccination 12/13/2019  . Tdap 01/26/2018     Objective: Vital Signs: BP 127/84 (BP Location: Left Arm, Patient Position: Sitting, Cuff Size: Large)   Pulse (!) 56   Resp 16   Ht 5' 2" (1.575 m)   Wt 230 lb 6.4 oz (104.5 kg)   BMI 42.14 kg/m    Physical Exam Vitals and nursing note reviewed.  Constitutional:      Appearance: He is well-developed.  HENT:     Head: Normocephalic and atraumatic.  Eyes:     Conjunctiva/sclera: Conjunctivae normal.     Pupils: Pupils are equal, round, and reactive to light.  Pulmonary:     Effort: Pulmonary effort is normal.  Abdominal:     Palpations: Abdomen is soft.  Musculoskeletal:     Cervical back: Normal range of motion and neck supple.  Skin:    General: Skin is warm and dry.     Capillary Refill: Capillary refill takes less than 2 seconds.  Neurological:     Mental Status: He is alert and oriented to person, place, and time.  Psychiatric:        Behavior: Behavior normal.       Musculoskeletal Exam: C-spine, thoracic spine, and lumbar spine good ROM.  Shoulder joints, elbow joints, wrist joints, MCPs, PIPs, DIPs have good range of motion with no synovitis. Complete fist formation bilaterally.  Hip joints have good range of motion with no discomfort.  Knee joints have good range of motion with no warmth or effusion.  He has good ROM with tenderness of both ankle joints.  No warmth or swelling of ankle joints noted on exam.   CDAI Exam: CDAI Score: -- Patient Global: --; Provider Global: -- Swollen: --; Tender: -- Joint Exam 12/27/2019   No joint exam has been documented for this visit   There is currently no information documented on the homunculus. Go to the Rheumatology activity and  complete the homunculus joint exam.  Investigation: No additional findings.  Imaging: No results found.  Recent Labs: Lab Results  Component Value Date   WBC 2.4 (L) 07/05/2019   HGB 12.7 07/05/2019   PLT 257 07/05/2019   NA 139 07/05/2019   K 4.3 07/05/2019   CL 105 07/05/2019   CO2 28 07/05/2019   GLUCOSE 87 07/05/2019   BUN 8 07/05/2019   CREATININE 0.67 07/05/2019   BILITOT 0.5 07/05/2019   ALKPHOS 63 06/07/2019   AST 26 07/05/2019   ALT 22 07/05/2019   PROT 6.7 07/05/2019   ALBUMIN 4.0 06/07/2019   CALCIUM 9.5 07/05/2019   GFRAA 132 07/05/2019    Speciality Comments: No specialty comments available.  Procedures:  No procedures performed Allergies: Patient has no known allergies.   Assessment / Plan:     Visit Diagnoses: Positive ANA (antinuclear antibody) - ANA 1: 320 homogeneous, SSA> 8.0, dsDNA 7, rest the ENA was negative, complements normal, ESR 6, history of neutropenia: He has not had any signs or symptoms of a flare recently.  He is clinically doing well on Plaquenil 200 mg 1 tablet by mouth twice daily.  He continues to tolerate Plaquenil without any side effects and has not missed any doses recently.  Lab work from 07/05/2019 was reviewed with  the patient today in the office and all questions were addressed.  Sedimentation rate was 6, double-stranded DNA was 7, and complements within normal limits.  He continues to experience generalized arthralgias and myalgias.  No synovitis or muscle weakness was noted on exam.  He has been taking Tylenol arthritis as needed for pain relief and will be establishing with pain management today.  He continues to have chronic fatigue secondary to insomnia.  We discussed the importance of regular exercise and good sleep hygiene.  He has not had any recent rashes, increased photosensitivity, oral or nasal ulcerations, symptoms of Raynaud's, enlarged lymph nodes, chest pain, or shortness of breath.  He continues to have chronic sicca symptoms and the use of over-the-counter products were discussed.  He will continue taking Plaquenil as prescribed.  A refill of Plaquenil was sent to the pharmacy today.  We will recheck autoimmune lab work today.  Orders were released.  He was advised to notify us if he develops any new or worsening symptoms.  He will follow-up in the office in 5 months. - Plan: Urinalysis, Routine w reflex microscopic, COMPLETE METABOLIC PANEL WITH GFR, CBC with Differential/Platelet, Anti-DNA antibody, double-stranded, C3 and C4, Sedimentation rate, VITAMIN D 25 Hydroxy (Vit-D Deficiency, Fractures), hydroxychloroquine (PLAQUENIL) 200 MG tablet  High risk medication use - PLQ 200 mg 1 tablet by mouth twice daily (started in July 2020).  We stressed the importance of scheduling a Plaquenil eye exam.  He was advised to notify us if he needs a new referral to an ophthalmologist.  He was given a Plaquenil eye exam form to take with him to his appointment. CBC and CMP were drawn on 07/05/2019.  He is due to update lab work today.  Orders for CBC and CMP were released.- Plan: COMPLETE METABOLIC PANEL WITH GFR, CBC with Differential/Platelet He has not had any recent infections.  He received his first COVID-19  vaccination on 12/13/2019.  Primary osteoarthritis of both knees: Chronic pain.  He has good ROM of both knee joints with no warmth or effusion.  Overall his knee joint discomfort has improved since starting on Plaquenil.  He has not had any recurrence of knee  joint effusions.  Elevated CK - 07/05/2019: 363.  We will recheck CK today.  He continues to have generalized myalgias and muscle tenderness.  He is not experiencing any muscle weakness at this time.- Plan: CK  Myalgia: He has ongoing myalgias and muscle tenderness.  He is not experiencing any muscle weakness at this time.  He will be establishing with pain management today.  He recently increased the dose of Cymbalta to 30 mg 1 capsule by mouth twice daily and continues to take gabapentin as prescribed.  He has noticed some improvement in his fatigue and generalized pain since increasing the dose of Cymbalta.  Vitamin D deficiency - Vitamin D was 9 on 07/05/2019.  He recently completed a course of vitamin D 50,000 units by mouth twice weekly.  We will recheck vitamin D level today.  Plan: VITAMIN D 25 Hydroxy (Vit-D Deficiency, Fractures)  Status post left foot surgery   Other fatigue: Chronic and secondary to insomnia. We discussed the importance of regular exercise and good sleep hygiene.   Other insomnia: He continues to have difficulty sleeping at night.  He has interrupted sleep and has only been sleeping 4 to 5 hours per night.  We discussed importance of good sleep hygiene.  He continues to take gabapentin 200 mg at bedtime.  Other medical conditions are listed as follows:   Female-to-female transgender person  Family history of lupus erythematosus - 3 first cousins  Family history of polymyositis - Father.      Orders: Orders Placed This Encounter  Procedures  . Urinalysis, Routine w reflex microscopic  . COMPLETE METABOLIC PANEL WITH GFR  . CBC with Differential/Platelet  . Anti-DNA antibody, double-stranded  . C3 and C4    . Sedimentation rate  . VITAMIN D 25 Hydroxy (Vit-D Deficiency, Fractures)  . CK   Meds ordered this encounter  Medications  . hydroxychloroquine (PLAQUENIL) 200 MG tablet    Sig: Take 1 tablet (200 mg total) by mouth 2 (two) times daily.    Dispense:  180 tablet    Refill:  0     Follow-Up Instructions: Return in about 5 months (around 05/26/2020) for +ANA, Osteoarthritis.   Ofilia Neas, PA-C  Note - This record has been created using Dragon software.  Chart creation errors have been sought, but may not always  have been located. Such creation errors do not reflect on  the standard of medical care.

## 2019-12-20 ENCOUNTER — Inpatient Hospital Stay: Payer: BC Managed Care – PPO | Attending: Hematology

## 2019-12-20 ENCOUNTER — Inpatient Hospital Stay: Payer: BC Managed Care – PPO | Admitting: Hematology

## 2019-12-21 NOTE — Progress Notes (Signed)
prior authorization submitted via covermymeds.  Waiting response. KeyMicah Flesher - PA Case ID: [id number] - Rx #: [id number] Need help? Call us at [phone] Status Sent to [facility] Testosterone Cypionate 200MG /ML intramuscular solution Form [facility] Electronic Prior Authorization Form (2017 NCPDP) Original Claim Info [address] 61 ePA at [email or web address] Requires Prior Authorization

## 2019-12-27 ENCOUNTER — Encounter: Payer: Self-pay | Admitting: Physician Assistant

## 2019-12-27 ENCOUNTER — Ambulatory Visit: Payer: BC Managed Care – PPO | Admitting: Physician Assistant

## 2019-12-27 ENCOUNTER — Other Ambulatory Visit: Payer: Self-pay

## 2019-12-27 VITALS — BP 127/84 | HR 56 | Resp 16 | Ht 62.0 in | Wt 230.4 lb

## 2019-12-27 DIAGNOSIS — Z79899 Other long term (current) drug therapy: Secondary | ICD-10-CM

## 2019-12-27 DIAGNOSIS — Z9889 Other specified postprocedural states: Secondary | ICD-10-CM | POA: Diagnosis not present

## 2019-12-27 DIAGNOSIS — M359 Systemic involvement of connective tissue, unspecified: Secondary | ICD-10-CM | POA: Diagnosis not present

## 2019-12-27 DIAGNOSIS — R760 Raised antibody titer: Secondary | ICD-10-CM | POA: Diagnosis not present

## 2019-12-27 DIAGNOSIS — R768 Other specified abnormal immunological findings in serum: Secondary | ICD-10-CM

## 2019-12-27 DIAGNOSIS — R748 Abnormal levels of other serum enzymes: Secondary | ICD-10-CM | POA: Diagnosis not present

## 2019-12-27 DIAGNOSIS — G894 Chronic pain syndrome: Secondary | ICD-10-CM | POA: Diagnosis not present

## 2019-12-27 DIAGNOSIS — Z8269 Family history of other diseases of the musculoskeletal system and connective tissue: Secondary | ICD-10-CM

## 2019-12-27 DIAGNOSIS — R5383 Other fatigue: Secondary | ICD-10-CM

## 2019-12-27 DIAGNOSIS — Z79891 Long term (current) use of opiate analgesic: Secondary | ICD-10-CM | POA: Diagnosis not present

## 2019-12-27 DIAGNOSIS — Z789 Other specified health status: Secondary | ICD-10-CM

## 2019-12-27 DIAGNOSIS — E559 Vitamin D deficiency, unspecified: Secondary | ICD-10-CM

## 2019-12-27 DIAGNOSIS — M170 Bilateral primary osteoarthritis of knee: Secondary | ICD-10-CM

## 2019-12-27 DIAGNOSIS — Z840 Family history of diseases of the skin and subcutaneous tissue: Secondary | ICD-10-CM

## 2019-12-27 DIAGNOSIS — M150 Primary generalized (osteo)arthritis: Secondary | ICD-10-CM | POA: Diagnosis not present

## 2019-12-27 DIAGNOSIS — G4709 Other insomnia: Secondary | ICD-10-CM

## 2019-12-27 DIAGNOSIS — M791 Myalgia, unspecified site: Secondary | ICD-10-CM

## 2019-12-27 MED ORDER — HYDROXYCHLOROQUINE SULFATE 200 MG PO TABS: 200.0000 mg | ORAL_TABLET | Freq: Two times a day (BID) | ORAL | 0 refills | Status: DC

## 2019-12-28 LAB — CBC WITH DIFFERENTIAL/PLATELET
Absolute Monocytes: 277 cells/uL (ref 200–950)
Basophils Absolute: 10 cells/uL (ref 0–200)
Basophils Relative: 0.3 %
Eosinophils Absolute: 20 cells/uL (ref 15–500)
Eosinophils Relative: 0.6 %
HCT: 36.7 % (ref 35.0–45.0)
Hemoglobin: 11.8 g/dL (ref 11.7–15.5)
Lymphs Abs: 1066 cells/uL (ref 850–3900)
MCH: 27.8 pg (ref 27.0–33.0)
MCHC: 32.2 g/dL (ref 32.0–36.0)
MCV: 86.6 fL (ref 80.0–100.0)
MPV: 11.2 fL (ref 7.5–12.5)
Monocytes Relative: 8.4 %
Neutro Abs: 1927 cells/uL (ref 1500–7800)
Neutrophils Relative %: 58.4 %
Platelets: 210 10*3/uL (ref 140–400)
RBC: 4.24 10*6/uL (ref 3.80–5.10)
RDW: 13 % (ref 11.0–15.0)
Total Lymphocyte: 32.3 %
WBC: 3.3 10*3/uL — ABNORMAL LOW (ref 3.8–10.8)

## 2019-12-28 LAB — C3 AND C4
C3 Complement: 103 mg/dL (ref 83–193)
C4 Complement: 27 mg/dL (ref 15–57)

## 2019-12-28 LAB — COMPLETE METABOLIC PANEL WITH GFR
AG Ratio: 1.3 (calc) (ref 1.0–2.5)
ALT: 10 U/L (ref 6–29)
AST: 16 U/L (ref 10–30)
Albumin: 3.8 g/dL (ref 3.6–5.1)
Alkaline phosphatase (APISO): 50 U/L (ref 31–125)
BUN: 12 mg/dL (ref 7–25)
CO2: 28 mmol/L (ref 20–32)
Calcium: 9 mg/dL (ref 8.6–10.2)
Chloride: 105 mmol/L (ref 98–110)
Creat: 0.63 mg/dL (ref 0.50–1.10)
GFR, Est African American: 135 mL/min/{1.73_m2} (ref >=60)
GFR, Est Non African American: 116 mL/min/{1.73_m2} (ref >=60)
Globulin: 2.9 g/dL (calc) (ref 1.9–3.7)
Glucose, Bld: 80 mg/dL (ref 65–99)
Potassium: 4.3 mmol/L (ref 3.5–5.3)
Sodium: 138 mmol/L (ref 135–146)
Total Bilirubin: 0.5 mg/dL (ref 0.2–1.2)
Total Protein: 6.7 g/dL (ref 6.1–8.1)

## 2019-12-28 LAB — SEDIMENTATION RATE: Sed Rate: 9 mm/h (ref 0–20)

## 2019-12-28 LAB — CK: Total CK: 187 U/L — ABNORMAL HIGH (ref 29–143)

## 2019-12-28 LAB — URINALYSIS, ROUTINE W REFLEX MICROSCOPIC
Bilirubin Urine: NEGATIVE
Glucose, UA: NEGATIVE
Hgb urine dipstick: NEGATIVE
Ketones, ur: NEGATIVE
Leukocytes,Ua: NEGATIVE
Nitrite: NEGATIVE
Protein, ur: NEGATIVE
Specific Gravity, Urine: 1.028 (ref 1.001–1.03)
pH: 6.5 (ref 5.0–8.0)

## 2019-12-28 LAB — ANTI-DNA ANTIBODY, DOUBLE-STRANDED: ds DNA Ab: 9 IU/mL — ABNORMAL HIGH

## 2019-12-28 LAB — VITAMIN D 25 HYDROXY (VIT D DEFICIENCY, FRACTURES): Vit D, 25-Hydroxy: 8 ng/mL — ABNORMAL LOW (ref 30–100)

## 2019-12-28 NOTE — Progress Notes (Signed)
UA normal. WBC count is low but improving. Rest of CBC WNL.  CMP WNL.  DsDNA remains positive but is stable.  ESR and complements WNL.  CK mildly elevated is trending down. Vitamin D is extremely low-8.  Please send in vitamin D 50,000 units by mouth twice weekly x3 months. Please stress the importance of rechecking vitamin D in exactly 3 months to see his improvement.

## 2019-12-29 ENCOUNTER — Other Ambulatory Visit: Payer: Self-pay | Admitting: *Deleted

## 2019-12-29 DIAGNOSIS — E559 Vitamin D deficiency, unspecified: Secondary | ICD-10-CM

## 2019-12-29 MED ORDER — VITAMIN D (ERGOCALCIFEROL) 1.25 MG (50000 UNIT) PO CAPS
50000.0000 [IU] | ORAL_CAPSULE | ORAL | 0 refills | Status: DC
Start: 2019-12-29 — End: 2022-05-16

## 2019-12-29 NOTE — Telephone Encounter (Signed)
-----  Message from Ofilia Neas, PA-C sent at 12/28/2019  5:04 PM EDT ----- UA normal. WBC count is low but improving. Rest of CBC WNL.  CMP WNL.  DsDNA remains positive but is stable.  ESR and complements WNL.  CK mildly elevated is trending down. Vitamin D is extremely low-8.  Please send in vitamin D 50,000 units by mouth twice weekly x3 months. Please stress the importance of rechecking vitamin D in exactly 3 months to see his improvement.

## 2020-01-03 ENCOUNTER — Ambulatory Visit: Payer: BC Managed Care – PPO | Admitting: Family Medicine

## 2020-01-03 ENCOUNTER — Encounter: Payer: Self-pay | Admitting: Family Medicine

## 2020-01-03 VITALS — BP 159/99 | HR 60 | Ht 61.75 in | Wt 228.0 lb

## 2020-01-03 DIAGNOSIS — M791 Myalgia, unspecified site: Secondary | ICD-10-CM | POA: Diagnosis not present

## 2020-01-03 DIAGNOSIS — M2550 Pain in unspecified joint: Secondary | ICD-10-CM | POA: Diagnosis not present

## 2020-01-03 MED ORDER — ETODOLAC 400 MG PO TABS: 400.0000 mg | ORAL_TABLET | Freq: Two times a day (BID) | ORAL | 0 refills | Status: DC

## 2020-01-03 NOTE — Progress Notes (Signed)
Chief Complaint  Patient presents with  . Follow-up    pt says he is off balance and said he noticed tremors.     HISTORY OF PRESENT ILLNESS: Today 01/03/20  Madeline Larsen is a 34 y.o. adult here today for follow up for neuralgia. We increased gabapentin to 250m TID and continues etodolac 407mBID at last visit in 05/2019. NCS/EMG normal. He completed PT and dry needling with minimal improvement in weakness. Rheumatology continues Plaquenil. Labs are improving per last note. He was referred to pain management. They discontinued gabapentin and started Lyrica 75. PCP increased Cymbalta to 60. He feels that this combination is working well for him. He does continue to feel that his balance is off. PT has discharged him. No falls. He has noticed some tremors when using his phone for the past three weeks. Tremor in hands only and very mild. He is taking Benicar for HTN. Readings are typically 120's/50-60's. He has taken his medication today. No headaches, chest pain, blurred vision. 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    HISTORY (copied from my note on 05/10/2019)  Madeline A MeAughenbaughs a [age].o. adult here today for follow up for neuralgia. He has continued lower dose of gabapentin 10051mn am, 100m74m lunch and 200mg11mnight. He continues to have joint and muscle pain.  He was referred to PT in October but reports he could not go due to elevated blood pressures. MR cervical spine was normal. He was seen by PCP last month who has referred him to PT. He is planning to begin therapy next week. He has had an extensive workup with rheumatology. He continues Plaquenil but doesn't feel that it has helped much. He had a sleep study and reports it was normal. He does not rest well. He reports seeing [facility] and feels this is beneficial. He was started on Cymbalta about 3 weeks ago and feels this is helping some.    HISTORY: (copied from Dr SaterGarth Bigness on 01/06/2019)  Madeline Larsen 32 ye27 old female to female transgender man with polyarthralgia and myalgias.  Update 01/06/2019: He presented to the ED 12/28/18. He felt fine in the morning and pain was doing much better since starting gabapentin with near resolution of the neuralgias. While at work, he started feeling worse and coworkers noted the eyes looked bloodshot. He felt confused and was taken to the ED. BP was elevated at 180/100. In the ED he had an MRI and was read as normal.. I personally reviewed and concur with the interpretation. EEG was normal. He was kept x 2 days and discharged. The gabapentin was held. The next day he was doing better and he was discharged the following day on a lower dose of gabapentin 100-100-200. He feels that dose is helping his pain as much as the higher dose.   Currently, he reports mild weakness in the left leg and notes a little stutter and slowing at times with speech.   Sleep is better at night than last visit.  From 12/16/18: He has a long history of pain in his legs and ankles and this has progressed over the last year.. He has seen Dr. [facility]andy[facility]umatology. Some of his rheumatologic tests have been abnormal but there is no definite diagnosis.. He Marland Kitchenotes his pain is worse in the joints than muscles,especially the knees. He has mild shoulder pain but otherwise arms  feel fine most of the time, Dr. Estanislado Pandy placed him on hydroxychloroquine. He takes 400 mg ibuprofen and acetaminophen 1000 mg every 4-6 hours.   He notes mild weakness in his thighs and calves. Arms are strong. He notes no change in bladder function. He denies much back pain but he does report back pain. There is no radiating pain from the back no numbness in the arms or legs.  He is sleeping poorly at night. Many nights he just gets 4 hours. He snores some but has never been told there are  pauses or gasps. He denies depression.   I reviewed labs: ESR, CBC, CMP; RF; HLAB27; CCP; Scl-70/ENA; C3/C4, Beta-2 glycoprotein; anti-cardiolipin; lupus anticoagulant; G6PDH, HepB, HepC were all negative or normal.   CK minimally elevated at 266; dsDNA Ab is indeterminate at 7; SSA (Ro) was positive at 8.0; TSH was mildly low at 0.31; uric acid mildly elevated at 7.8; ACE is mildly elevated at 87.  Xrays of knees show moderate osteoarthritis.      REVIEW OF SYSTEMS: Out of a complete 14 system review of symptoms, the patient complains only of the following symptoms, low back pain, anxiety, imbalance,  and all other reviewed systems are negative.   ALLERGIES: No Known Allergies   HOME MEDICATIONS: Outpatient Medications Prior to Visit  Medication Sig Dispense Refill  . DULoxetine (CYMBALTA) 30 MG capsule Take 1 capsule (30 mg total) by mouth 2 (two) times daily. 180 capsule 2  . hydroxychloroquine (PLAQUENIL) 200 MG tablet Take 1 tablet (200 mg total) by mouth 2 (two) times daily. 180 tablet 0  . lubiprostone (AMITIZA) 8 MCG capsule Take 3 capsules (24 mcg total) by mouth daily. 90 capsule 5  . olmesartan (BENICAR) 5 MG tablet Take 2 tablets (10 mg total) by mouth every morning. 180 tablet 3  . ondansetron (ZOFRAN ODT) 8 MG disintegrating tablet Take 1 tablet (8 mg total) by mouth every 8 (eight) hours as needed for nausea or vomiting. 30 tablet 0  . pregabalin (LYRICA) 75 MG capsule Take 75 mg by mouth 2 (two) times daily.    Marland Kitchen testosterone cypionate (DEPOTESTOSTERONE CYPIONATE) 200 MG/ML injection Inject 0.4 mLs (80 mg total) into the muscle once a week. And syringes 1/week 10 mL 0  . Vitamin D, Ergocalciferol, (DRISDOL) 1.25 MG (50000 UNIT) CAPS capsule Take 1 capsule (50,000 Units total) by mouth 2 (two) times a week. 24 capsule 0  . etodolac (LODINE) 400 MG tablet Take 1 tablet (400 mg total) by mouth 2 (two) times daily. 60 tablet 5  . gabapentin (NEURONTIN) 100 MG  capsule Two capsules po qAM; two po qPM and two po qHS 180 capsule 5   No facility-administered medications prior to visit.     PAST MEDICAL HISTORY: Past Medical History:  Diagnosis Date  . Depression   . Female-to-female transgender person   . GERD (gastroesophageal reflux disease)   . MRSA (methicillin resistant staph aureus) culture positive      PAST SURGICAL HISTORY: Past Surgical History:  Procedure Laterality Date  . FOOT SURGERY Left   . top surgery  02/2018     FAMILY HISTORY: Family History  Problem Relation Age of Onset  . Alcohol abuse Mother   . Cancer Mother 57       Uterine cancer  . Drug abuse Mother   . HIV/AIDS Mother   . Early death Mother 26       uterine cancer  . Alcohol abuse Father   .  Drug abuse Father   . Polymyositis Father 84       deceased at age 75  . Early death Father 19       polymyositis  . Alcohol abuse Sister   . Depression Sister   . Drug abuse Sister   . Depression Brother   . Kidney disease Maternal Grandmother        ESRD on dialysis  . Hypertension Maternal Grandmother   . Rheum arthritis Paternal Grandmother   . Rheum arthritis Brother   . Depression Sister   . Depression Sister   . Depression Sister   . Lupus Cousin   . Lupus Cousin   . Lupus Cousin   . Prostate cancer Paternal Uncle      SOCIAL HISTORY: Social History   Socioeconomic History  . Marital status: Married    Spouse name: Not on file  . Number of children: 2  . Years of education: Not on file  . Highest education level: High school graduate  Occupational History  . Not on file  Tobacco Use  . Smoking status: Never Smoker  . Smokeless tobacco: Never Used  Vaping Use  . Vaping Use: Never used  Substance and Sexual Activity  . Alcohol use: Yes    Alcohol/week: 0.0 standard drinks    Comment: socially  . Drug use: Not Currently    Types: Marijuana  . Sexual activity: Yes    Birth control/protection: None    Comment: female partner    Other Topics Concern  . Not on file  Social History Narrative   Lives at home with wife & kids   Right handed   Social Determinants of Health   Financial Resource Strain:   . Difficulty of Paying Living Expenses: Not on file  Food Insecurity:   . Worried About Charity fundraiser in the Last Year: Not on file  . Ran Out of Food in the Last Year: Not on file  Transportation Needs:   . Lack of Transportation (Medical): Not on file  . Lack of Transportation (Non-Medical): Not on file  Physical Activity:   . Days of Exercise per Week: Not on file  . Minutes of Exercise per Session: Not on file  Stress:   . Feeling of Stress : Not on file  Social Connections:   . Frequency of Communication with Friends and Family: Not on file  . Frequency of Social Gatherings with Friends and Family: Not on file  . Attends Religious Services: Not on file  . Active Member of Clubs or Organizations: Not on file  . Attends Archivist Meetings: Not on file  . Marital Status: Not on file  Intimate Partner Violence:   . Fear of Current or Ex-Partner: Not on file  . Emotionally Abused: Not on file  . Physically Abused: Not on file  . Sexually Abused: Not on file      PHYSICAL EXAM  Vitals:   01/03/20 1347  BP: (!) 159/99  Pulse: 60  Weight: 228 lb (103.4 kg)  Height: 5' 1.75" (1.568 m)   Body mass index is 42.04 kg/m.   Generalized: Well developed, in no acute distress   Neurological examination  Mentation: Alert oriented to time, place, history taking. Follows all commands speech and language fluent Cranial nerve II-XII: Pupils were equal round reactive to light. Extraocular movements were full, visual field were full on confrontational test. Facial sensation and strength were normal. Head turning and shoulder shrug  were normal  and symmetric. Motor: The motor testing reveals 5 over 5 strength of all 4 extremities. Good symmetric motor tone is noted throughout.  Sensory:  Sensory testing is intact to soft touch on all 4 extremities. No evidence of extinction is noted.   Gait and station: Gait is normal.  Reflexes: Deep tendon reflexes are symmetric and normal bilaterally.     DIAGNOSTIC DATA (LABS, IMAGING, TESTING) - I reviewed patient records, labs, notes, testing and imaging myself where available.  Lab Results  Component Value Date   WBC 3.3 (L) 12/27/2019   HGB 11.8 12/27/2019   HCT 36.7 12/27/2019   MCV 86.6 12/27/2019   PLT 210 12/27/2019      Component Value Date/Time   NA 138 12/27/2019 0959   K 4.3 12/27/2019 0959   CL 105 12/27/2019 0959   CO2 28 12/27/2019 0959   GLUCOSE 80 12/27/2019 0959   BUN 12 12/27/2019 0959   CREATININE 0.63 12/27/2019 0959   CALCIUM 9.0 12/27/2019 0959   PROT 6.7 12/27/2019 0959   ALBUMIN 4.0 06/07/2019 1231   AST 16 12/27/2019 0959   AST 31 06/07/2019 1231   ALT 10 12/27/2019 0959   ALT 25 06/07/2019 1231   ALKPHOS 63 06/07/2019 1231   BILITOT 0.5 12/27/2019 0959   BILITOT 0.7 06/07/2019 1231   GFRNONAA 116 12/27/2019 0959   GFRAA 135 12/27/2019 0959   Lab Results  Component Value Date   CHOL 148 05/04/2019   HDL 38.10 (L) 05/04/2019   LDLCALC 94 05/04/2019   TRIG 76.0 05/04/2019   CHOLHDL 4 05/04/2019   Lab Results  Component Value Date   HGBA1C 5.5 05/04/2019   Lab Results  Component Value Date   WIOXBDZH29 924 06/07/2019   Lab Results  Component Value Date   TSH 0.81 07/12/2019      ASSESSMENT AND PLAN  34 y.o. year old adult  has a past medical history of Depression, Female-to-female transgender person, GERD (gastroesophageal reflux disease), and MRSA (methicillin resistant staph aureus) culture positive. here with   Polyarthralgia  Myalgia  Madeline Larsen has had significant improvement in polyarthralgias and myalgias since last being seen.  He has now been seen by pain management who is started Lyrica and this seems to be very helpful.  He has continued etodolac 400 mg but has not  needed this recently.  I will refill 60 tablets and I have encouraged him to discuss continued refills with pain management.  Primary care continues Cymbalta and hypertension management.  Blood pressure is elevated today.  He does not routinely check at home but last reading at rheumatology was 120s over 3s.  He is asymptomatic today.  He was encouraged to monitor very closely at home and call primary care for consistently high readings.  Fall precautions reviewed.  He will continue to monitor concerns of tremor.  Neuro examination is normal today.  May be mild essential tremor versus medication side effects.  He will discuss with primary care for any worsening.  Healthy lifestyle habits encouraged.  He will follow-up with [place] as needed.  He verbalizes understanding and agreement with this plan.   I spent 20 minutes of face-to-face and non-face-to-face time with patient.  This included previsit chart review, lab review, study review, order entry, electronic health record documentation, patient education.    Debbora Presto, MSN, FNP-C 01/03/2020, 2:19 PM  [facility] [address] [place], [place] [address] [phone]

## 2020-01-03 NOTE — Patient Instructions (Signed)
Please continue current treatment plan. Continue close follow up with PCP, Rheumatology and Pain Management  Follow up with [place] as needed   Fall Prevention in the Home, Adult Falls can cause injuries. They can happen to people of all ages. There are many things you can do to make your home safe and to help prevent falls. Ask for help when making these changes, if needed. What actions can I take to prevent falls? General Instructions  Use good lighting in all rooms. Replace any light bulbs that burn out.  Turn on the lights when you go into a dark area. Use night-lights.  Keep items that you use often in easy-to-reach places. Lower the shelves around your home if necessary.  Set up your furniture so you have a clear path. Avoid moving your furniture around.  Do not have throw rugs and other things on the floor that can make you trip.  Avoid walking on wet floors.  If any of your floors are uneven, fix them.  Add color or contrast paint or tape to clearly mark and help you see: ? Any grab bars or handrails. ? First and last steps of stairways. ? Where the edge of each step is.  If you use a stepladder: ? Make sure that it is fully opened. Do not climb a closed stepladder. ? Make sure that both sides of the stepladder are locked into place. ? Ask someone to hold the stepladder for you while you use it.  If there are any pets around you, be aware of where they are. What can I do in the bathroom?      Keep the floor dry. Clean up any water that spills onto the floor as soon as it happens.  Remove soap buildup in the tub or shower regularly.  Use non-skid mats or decals on the floor of the tub or shower.  Attach bath mats securely with double-sided, non-slip rug tape.  If you need to sit down in the shower, use a plastic, non-slip stool.  Install grab bars by the toilet and in the tub and shower. Do not use towel bars as grab bars. What can I do in the bedroom?  Make  sure that you have a light by your bed that is easy to reach.  Do not use any sheets or blankets that are too big for your bed. They should not hang down onto the floor.  Have a firm chair that has side arms. You can use this for support while you get dressed. What can I do in the kitchen?  Clean up any spills right away.  If you need to reach something above you, use a strong step stool that has a grab bar.  Keep electrical cords out of the way.  Do not use floor polish or wax that makes floors slippery. If you must use wax, use non-skid floor wax. What can I do with my stairs?  Do not leave any items on the stairs.  Make sure that you have a light switch at the top of the stairs and the bottom of the stairs. If you do not have them, ask someone to add them for you.  Make sure that there are handrails on both sides of the stairs, and use them. Fix handrails that are broken or loose. Make sure that handrails are as long as the stairways.  Install non-slip stair treads on all stairs in your home.  Avoid having throw rugs at the top or  bottom of the stairs. If you do have throw rugs, attach them to the floor with carpet tape.  Choose a carpet that does not hide the edge of the steps on the stairway.  Check any carpeting to make sure that it is firmly attached to the stairs. Fix any carpet that is loose or worn. What can I do on the outside of my home?  Use bright outdoor lighting.  Regularly fix the edges of walkways and driveways and fix any cracks.  Remove anything that might make you trip as you walk through a door, such as a raised step or threshold.  Trim any bushes or trees on the path to your home.  Regularly check to see if handrails are loose or broken. Make sure that both sides of any steps have handrails.  Install guardrails along the edges of any raised decks and porches.  Clear walking paths of anything that might make someone trip, such as tools or  rocks.  Have any leaves, snow, or ice cleared regularly.  Use sand or salt on walking paths during winter.  Clean up any spills in your garage right away. This includes grease or oil spills. What other actions can I take?  Wear shoes that: ? Have a low heel. Do not wear high heels. ? Have rubber bottoms. ? Are comfortable and fit you well. ? Are closed at the toe. Do not wear open-toe sandals.  Use tools that help you move around (mobility aids) if they are needed. These include: ? Canes. ? Walkers. ? Scooters. ? Crutches.  Review your medicines with your doctor. Some medicines can make you feel dizzy. This can increase your chance of falling. Ask your doctor what other things you can do to help prevent falls. Where to find more information  Centers for Disease Control and Prevention, STEADI: HealthcareCounselor.com.pt  [employer] on Aging: RingConnections.si Contact a doctor if:  You are afraid of falling at home.  You feel weak, drowsy, or dizzy at home.  You fall at home. Summary  There are many simple things that you can do to make your home safe and to help prevent falls.  Ways to make your home safe include removing tripping hazards and installing grab bars in the bathroom.  Ask for help when making these changes in your home. This information is not intended to replace advice given to you by your health care provider. Make sure you discuss any questions you have with your health care provider. Document Revised: 06/17/2018 Document Reviewed: 10/09/2016 Elsevier Patient Education  2020 [employer].

## 2020-01-03 NOTE — Progress Notes (Signed)
I have read the note, and I agree with the clinical assessment and plan.  Raziyah Vanvleck A. Ruhan Borak, MD, PhD, FAAN Certified in Neurology, Clinical Neurophysiology, Sleep Medicine, Pain Medicine and Neuroimaging  [facility] [address], [address] , [place] [address] [phone]  

## 2020-02-07 ENCOUNTER — Ambulatory Visit: Payer: BC Managed Care – PPO | Admitting: Nurse Practitioner

## 2020-02-20 ENCOUNTER — Other Ambulatory Visit: Payer: Self-pay

## 2020-02-21 ENCOUNTER — Encounter: Payer: Self-pay | Admitting: Nurse Practitioner

## 2020-02-21 ENCOUNTER — Ambulatory Visit (INDEPENDENT_AMBULATORY_CARE_PROVIDER_SITE_OTHER): Payer: 59 | Admitting: Nurse Practitioner

## 2020-02-21 DIAGNOSIS — M791 Myalgia, unspecified site: Secondary | ICD-10-CM | POA: Diagnosis not present

## 2020-02-21 DIAGNOSIS — M2550 Pain in unspecified joint: Secondary | ICD-10-CM | POA: Diagnosis not present

## 2020-02-21 DIAGNOSIS — I10 Essential (primary) hypertension: Secondary | ICD-10-CM | POA: Diagnosis not present

## 2020-02-21 MED ORDER — DULOXETINE HCL 30 MG PO CPEP: 30.0000 mg | ORAL_CAPSULE | Freq: Two times a day (BID) | ORAL | 3 refills | Status: DC

## 2020-02-21 MED ORDER — OLMESARTAN MEDOXOMIL 5 MG PO TABS: 10.0000 mg | ORAL_TABLET | ORAL | 3 refills | Status: DC

## 2020-02-21 NOTE — Assessment & Plan Note (Addendum)
Improved with lyrica and vitamin D prescribed by pain clinic Stable mood with cymbalta Persistent fatigue despite 8-9hrs of sleep. Sleep study done 02/2019 (negative OSA).  Continue current medications

## 2020-02-21 NOTE — Assessment & Plan Note (Signed)
Elevated today. Did not take medication today. Advised about the importance of medication compliance. BP Readings from Last 3 Encounters:  02/21/20 140/90  01/03/20 (!) 159/99  12/27/19 127/84   Resume medication F/up in 66months or sooner if needed

## 2020-02-21 NOTE — Patient Instructions (Signed)
Maintain current medications, heart healthy diet and daily exercise.

## 2020-02-21 NOTE — Progress Notes (Signed)
Subjective:  Patient ID: Madeline Larsen, adult    DOB: [birth year]  Age: 34 y.o. MRN: [id number]  CC: Follow-up (Pt is here for 3 mo follow up-- HTN/ Chronic pain, pt states he's been to pain management and was put on lyrica which he states has helped tremendously, pt states he is still having trouble resting )   HPI  Essential hypertension Elevated today. Did not take medication today. Advised about the importance of medication compliance. BP Readings from Last 3 Encounters:  02/21/20 140/90  01/03/20 (!) 159/99  12/27/19 127/84   Resume medication F/up in 8months or sooner if needed  Myalgia Improved with lyrica and vitamin D prescribed by pain clinic Stable mood with cymbalta Persistent fatigue despite 8-9hrs of sleep. Sleep study done 02/2019 (negative OSA).  Continue current medications  BP Readings from Last 3 Encounters:  02/21/20 140/90  01/03/20 (!) 159/99  12/27/19 127/84   Reviewed lab results completed by rheumatologist: CBC, CMP, vitamin D, and CK  Reviewed past Medical, Social and Family history today.  Outpatient Medications Prior to Visit  Medication Sig Dispense Refill  . etodolac (LODINE) 400 MG tablet Take 1 tablet (400 mg total) by mouth 2 (two) times daily. 60 tablet 0  . hydroxychloroquine (PLAQUENIL) 200 MG tablet Take 1 tablet (200 mg total) by mouth 2 (two) times daily. 180 tablet 0  . lubiprostone (AMITIZA) 8 MCG capsule Take 3 capsules (24 mcg total) by mouth daily. 90 capsule 5  . ondansetron (ZOFRAN ODT) 8 MG disintegrating tablet Take 1 tablet (8 mg total) by mouth every 8 (eight) hours as needed for nausea or vomiting. 30 tablet 0  . pregabalin (LYRICA) 75 MG capsule Take 75 mg by mouth 2 (two) times daily.    Marland Kitchen testosterone cypionate (DEPOTESTOSTERONE CYPIONATE) 200 MG/ML injection Inject 0.4 mLs (80 mg total) into the muscle once a week. And syringes 1/week 10 mL 0  . Vitamin D, Ergocalciferol, (DRISDOL) 1.25 MG (50000 UNIT) CAPS capsule  Take 1 capsule (50,000 Units total) by mouth 2 (two) times a week. 24 capsule 0  . DULoxetine (CYMBALTA) 30 MG capsule Take 1 capsule (30 mg total) by mouth 2 (two) times daily. 180 capsule 2  . olmesartan (BENICAR) 5 MG tablet Take 2 tablets (10 mg total) by mouth every morning. 180 tablet 3   No facility-administered medications prior to visit.    ROS See HPI  Objective:  BP 140/90   Pulse 74   Temp (!) 97 F (36.1 C) (Temporal)   Ht 5\' 2"  (1.575 m)   Wt 232 lb (105.2 kg)   SpO2 98%   BMI 42.43 kg/m   Physical Exam Vitals reviewed.  Constitutional:      Appearance: He is obese.  Cardiovascular:     Rate and Rhythm: Normal rate and regular rhythm.     Pulses: Normal pulses.     Heart sounds: Normal heart sounds.  Pulmonary:     Effort: Pulmonary effort is normal.     Breath sounds: Normal breath sounds.  Musculoskeletal:     Right lower leg: No edema.     Left lower leg: No edema.  Neurological:     Mental Status: He is alert and oriented to person, place, and time.  Psychiatric:        Mood and Affect: Mood normal.        Behavior: Behavior normal.        Thought Content: Thought content normal.  Assessment & Plan:  This visit occurred during the SARS-CoV-2 public health emergency.  Safety protocols were in place, including screening questions prior to the visit, additional usage of staff PPE, and extensive cleaning of exam room while observing appropriate contact time as indicated for disinfecting solutions.   Madeline Larsen was seen today for follow-up.  Diagnoses and all orders for this visit:  Myalgia -     DULoxetine (CYMBALTA) 30 MG capsule; Take 1 capsule (30 mg total) by mouth 2 (two) times daily.  Polyarthralgia -     DULoxetine (CYMBALTA) 30 MG capsule; Take 1 capsule (30 mg total) by mouth 2 (two) times daily.  Essential hypertension -     olmesartan (BENICAR) 5 MG tablet; Take 2 tablets (10 mg total) by mouth every morning.    Problem List Items  Addressed This Visit      Cardiovascular and Mediastinum   Essential hypertension    Elevated today. Did not take medication today. Advised about the importance of medication compliance. BP Readings from Last 3 Encounters:  02/21/20 140/90  01/03/20 (!) 159/99  12/27/19 127/84   Resume medication F/up in 25months or sooner if needed      Relevant Medications   olmesartan (BENICAR) 5 MG tablet     Other   Myalgia    Improved with lyrica and vitamin D prescribed by pain clinic Stable mood with cymbalta Persistent fatigue despite 8-9hrs of sleep. Sleep study done 02/2019 (negative OSA).  Continue current medications      Relevant Medications   DULoxetine (CYMBALTA) 30 MG capsule   Polyarthralgia   Relevant Medications   DULoxetine (CYMBALTA) 30 MG capsule      Follow-up: Return in about 6 months (around 08/21/2020) for CPE (fasting).  Madeline Penna, NP

## 2020-03-05 ENCOUNTER — Other Ambulatory Visit: Payer: Self-pay | Admitting: Physician Assistant

## 2020-03-05 DIAGNOSIS — R768 Other specified abnormal immunological findings in serum: Secondary | ICD-10-CM

## 2020-03-05 NOTE — Telephone Encounter (Signed)
We do not have baseline eye examination results.  Please advise patient to forward those results to get the prescription refilled.  Please schedule a follow-up visit.  I declined the Plaquenil prescription.

## 2020-03-05 NOTE — Telephone Encounter (Signed)
Last Refill:  12/27/2019 Last office visit:  12/27/2019 Labs:  12/27/2019 - Patient advised UA normal. WBC count is low but improving. Rest of CBC WNL.  CMP WNL.  DsDNA remains positive but is stable.  ESR and complements WNL.  CK mildly elevated is trending down.  Patient advised Vitamin D is extremely low-8.  Prescription sent in for vitamin D 50,000 units by mouth twice weekly x3 months. Stressed the importance of rechecking vitamin D in exactly 3 months to see his improvement.   Patient expressed understanding.   No future appointment scheduled. 

## 2020-03-06 ENCOUNTER — Telehealth: Payer: Self-pay

## 2020-03-06 NOTE — Telephone Encounter (Signed)
LMOM for patient to call office to discuss. 

## 2020-03-06 NOTE — Telephone Encounter (Signed)
LMOM for patient to call and schedule 5 month follow-up appointment. 

## 2020-03-06 NOTE — Telephone Encounter (Signed)
-----   Message from Audrie Lia, RT sent at 03/05/2020  1:51 PM EST ----- Regarding: PLEASE CALL TO SCHEDLE FUTURE APPT. THANK YOU.  Return in about 5 months (around 05/26/2020) for +ANA, Osteoarthritis.

## 2020-04-05 DIAGNOSIS — M542 Cervicalgia: Secondary | ICD-10-CM | POA: Diagnosis not present

## 2020-04-05 DIAGNOSIS — M9902 Segmental and somatic dysfunction of thoracic region: Secondary | ICD-10-CM | POA: Diagnosis not present

## 2020-04-05 DIAGNOSIS — M62830 Muscle spasm of back: Secondary | ICD-10-CM | POA: Diagnosis not present

## 2020-04-05 DIAGNOSIS — M9903 Segmental and somatic dysfunction of lumbar region: Secondary | ICD-10-CM | POA: Diagnosis not present

## 2020-04-05 DIAGNOSIS — M62838 Other muscle spasm: Secondary | ICD-10-CM | POA: Diagnosis not present

## 2020-04-05 DIAGNOSIS — M545 Low back pain, unspecified: Secondary | ICD-10-CM | POA: Diagnosis not present

## 2020-04-05 DIAGNOSIS — M9901 Segmental and somatic dysfunction of cervical region: Secondary | ICD-10-CM | POA: Diagnosis not present

## 2020-04-05 DIAGNOSIS — M546 Pain in thoracic spine: Secondary | ICD-10-CM | POA: Diagnosis not present

## 2020-04-26 DIAGNOSIS — M9903 Segmental and somatic dysfunction of lumbar region: Secondary | ICD-10-CM | POA: Diagnosis not present

## 2020-04-26 DIAGNOSIS — M9902 Segmental and somatic dysfunction of thoracic region: Secondary | ICD-10-CM | POA: Diagnosis not present

## 2020-04-26 DIAGNOSIS — M545 Low back pain, unspecified: Secondary | ICD-10-CM | POA: Diagnosis not present

## 2020-04-26 DIAGNOSIS — M62830 Muscle spasm of back: Secondary | ICD-10-CM | POA: Diagnosis not present

## 2020-04-26 DIAGNOSIS — M9901 Segmental and somatic dysfunction of cervical region: Secondary | ICD-10-CM | POA: Diagnosis not present

## 2020-04-26 DIAGNOSIS — M542 Cervicalgia: Secondary | ICD-10-CM | POA: Diagnosis not present

## 2020-04-26 DIAGNOSIS — M62838 Other muscle spasm: Secondary | ICD-10-CM | POA: Diagnosis not present

## 2020-04-26 DIAGNOSIS — M546 Pain in thoracic spine: Secondary | ICD-10-CM | POA: Diagnosis not present

## 2020-05-22 ENCOUNTER — Encounter: Payer: Self-pay | Admitting: Nurse Practitioner

## 2020-05-22 ENCOUNTER — Encounter: Payer: Self-pay | Admitting: Endocrinology

## 2020-05-22 DIAGNOSIS — F640 Transsexualism: Secondary | ICD-10-CM | POA: Diagnosis not present

## 2020-05-22 DIAGNOSIS — Z01818 Encounter for other preprocedural examination: Secondary | ICD-10-CM | POA: Diagnosis not present

## 2020-05-22 DIAGNOSIS — I10 Essential (primary) hypertension: Secondary | ICD-10-CM | POA: Diagnosis not present

## 2020-05-22 DIAGNOSIS — Z124 Encounter for screening for malignant neoplasm of cervix: Secondary | ICD-10-CM | POA: Diagnosis not present

## 2020-05-22 DIAGNOSIS — N939 Abnormal uterine and vaginal bleeding, unspecified: Secondary | ICD-10-CM | POA: Diagnosis not present

## 2020-05-23 NOTE — Telephone Encounter (Signed)
Please contact pt for F/U appt.  Thank you,  Christy Gentles

## 2020-05-24 ENCOUNTER — Other Ambulatory Visit: Payer: Self-pay | Admitting: Endocrinology

## 2020-05-24 ENCOUNTER — Telehealth: Payer: Self-pay | Admitting: Endocrinology

## 2020-05-24 ENCOUNTER — Encounter: Payer: Self-pay | Admitting: Nurse Practitioner

## 2020-05-24 DIAGNOSIS — Z90710 Acquired absence of both cervix and uterus: Secondary | ICD-10-CM | POA: Insufficient documentation

## 2020-05-24 MED ORDER — TESTOSTERONE CYPIONATE 200 MG/ML IM SOLN
80.0000 mg | INTRAMUSCULAR | 0 refills | Status: DC
Start: 2020-05-24 — End: 2020-05-25

## 2020-05-24 NOTE — Telephone Encounter (Signed)
I sent refill 

## 2020-05-24 NOTE — Telephone Encounter (Signed)
REFILL REQUEST -  Testosterone  PHARMACY -   [id number] [place], [place] - [address]  [address], [place] [address]  Phone:  [phone] Fax:  [phone]

## 2020-05-24 NOTE — Telephone Encounter (Signed)
Please Advise

## 2020-05-25 ENCOUNTER — Telehealth: Payer: Self-pay | Admitting: Endocrinology

## 2020-05-25 MED ORDER — TESTOSTERONE CYPIONATE 200 MG/ML IM SOLN: 80.0000 mg | INTRAMUSCULAR | 0 refills | Status: DC

## 2020-05-25 NOTE — Telephone Encounter (Signed)
please contact patient: Ins wants [place] to slightly change the injection, but it is almost the same.  I have sent a prescription to your pharmacy.  I'll see you next time.

## 2020-05-28 ENCOUNTER — Ambulatory Visit: Payer: 59 | Admitting: Endocrinology

## 2020-06-21 ENCOUNTER — Telehealth: Payer: Self-pay

## 2020-06-21 MED ORDER — TESTOSTERONE ENANTHATE 50 MG/0.5ML ~~LOC~~ SOAJ: 80.0000 mg | SUBCUTANEOUS | 0 refills | Status: DC

## 2020-06-21 NOTE — Telephone Encounter (Signed)
I sent rx

## 2020-06-21 NOTE — Telephone Encounter (Signed)
PA was denied for Testosterone cypionate 200mg /ml bc pt did not meet the guidelines. They stated that pt needs to have testosterone enanthate instead.  Please Advise

## 2020-06-27 ENCOUNTER — Telehealth: Payer: Self-pay

## 2020-06-27 NOTE — Telephone Encounter (Signed)
PA from [employer] has been approved for Xyosted 50mg /0.21ml for 12 refills from 06/26/2020-06/26/2021

## 2020-07-02 ENCOUNTER — Ambulatory Visit: Payer: 59 | Admitting: Endocrinology

## 2020-07-02 ENCOUNTER — Other Ambulatory Visit: Payer: Self-pay

## 2020-07-02 ENCOUNTER — Other Ambulatory Visit (HOSPITAL_COMMUNITY): Payer: Self-pay

## 2020-07-02 ENCOUNTER — Ambulatory Visit (INDEPENDENT_AMBULATORY_CARE_PROVIDER_SITE_OTHER): Payer: 59 | Admitting: Endocrinology

## 2020-07-02 VITALS — BP 118/80 | HR 76 | Ht 62.0 in | Wt 236.2 lb

## 2020-07-02 DIAGNOSIS — E0590 Thyrotoxicosis, unspecified without thyrotoxic crisis or storm: Secondary | ICD-10-CM

## 2020-07-02 DIAGNOSIS — Z789 Other specified health status: Secondary | ICD-10-CM | POA: Diagnosis not present

## 2020-07-02 MED ORDER — "SYRINGE/NEEDLE (DISP) 18G X 1-1/2"" 3 ML MISC"
0 refills | Status: DC
  Filled 2020-07-02: qty 10, 10d supply, fill #0
  Filled 2020-07-04 – 2020-07-16 (×2): qty 10, 70d supply, fill #0

## 2020-07-02 MED ORDER — TESTOSTERONE CYPIONATE 200 MG/ML IM SOLN
80.0000 mg | INTRAMUSCULAR | 0 refills | Status: DC
  Filled 2020-07-02 – 2020-07-04 (×2): qty 4, 28d supply, fill #0
  Filled 2020-07-16: qty 10, 70d supply, fill #0

## 2020-07-02 MED ORDER — TESTOSTERONE CYPIONATE 100 MG/ML IM SOLN
80.0000 mg | INTRAMUSCULAR | 0 refills | Status: DC
  Filled 2020-07-02: qty 10, 84d supply, fill #0

## 2020-07-02 MED ORDER — "NEEDLE (DISP) 23G X 1"" MISC"
23.0000 g | 0 refills | Status: DC
  Filled 2020-07-02: qty 10, 10d supply, fill #0
  Filled 2020-07-04 – 2020-07-16 (×2): qty 10, 70d supply, fill #0

## 2020-07-02 NOTE — Patient Instructions (Addendum)
I have sent a prescription to your [facility] pharmacy, to refill the testosterone.   Please redo the blood tests in 4-6 weeks. No need to be fasting.  Please come back for a follow-up appointment in 6 months.

## 2020-07-02 NOTE — Progress Notes (Signed)
Subjective:    Patient ID: Madeline Larsen, adult    DOB: 1985-12-08, 35 y.o.   MRN: [id number]  HPI Pt returns for f/u of transgender state (F to M): Surgery: bilat mastectomies (Dr Festus Barren, in [place], [place]) in 2019 Medication: IM testosterone since 2015 Counseling: none recently Other: none Interval hx:  He has not recently takes depo-testosterone, as he has run out.  pt states he feels well in general.  He wants to try getting thru [facility] pharmacy.  He has intermitt menses.  He saw [facility], who plans TAH/BSO Pt also has hyperthyroidism (dx'ed 2020; he has never been on rx for this; f/u TSH was normal).   Past Medical History:  Diagnosis Date  . Depression   . Female-to-female transgender person   . GERD (gastroesophageal reflux disease)   . MRSA (methicillin resistant staph aureus) culture positive     Past Surgical History:  Procedure Laterality Date  . FOOT SURGERY Left   . top surgery  02/2018    Social History   Socioeconomic History  . Marital status: Married    Spouse name: Not on file  . Number of children: 2  . Years of education: Not on file  . Highest education level: High school graduate  Occupational History  . Not on file  Tobacco Use  . Smoking status: Never Smoker  . Smokeless tobacco: Never Used  Vaping Use  . Vaping Use: Never used  Substance and Sexual Activity  . Alcohol use: Yes    Alcohol/week: 0.0 standard drinks    Comment: socially  . Drug use: Not Currently    Types: Marijuana  . Sexual activity: Yes    Birth control/protection: None    Comment: female partner  Other Topics Concern  . Not on file  Social History Narrative   Lives at home with wife & kids   Right handed   Social Determinants of Health   Financial Resource Strain: Not on file  Food Insecurity: Not on file  Transportation Needs: Not on file  Physical Activity: Not on file  Stress: Not on file  Social Connections: Not on file  Intimate Partner Violence: Not on file     Current Outpatient Medications on File Prior to Visit  Medication Sig Dispense Refill  . DULoxetine (CYMBALTA) 30 MG capsule Take 1 capsule (30 mg total) by mouth 2 (two) times daily. 180 capsule 3  . etodolac (LODINE) 400 MG tablet Take 1 tablet (400 mg total) by mouth 2 (two) times daily. 60 tablet 0  . hydroxychloroquine (PLAQUENIL) 200 MG tablet Take 1 tablet (200 mg total) by mouth 2 (two) times daily. 180 tablet 0  . lubiprostone (AMITIZA) 8 MCG capsule Take 3 capsules (24 mcg total) by mouth daily. 90 capsule 5  . olmesartan (BENICAR) 5 MG tablet Take 2 tablets (10 mg total) by mouth every morning. 180 tablet 3  . ondansetron (ZOFRAN ODT) 8 MG disintegrating tablet Take 1 tablet (8 mg total) by mouth every 8 (eight) hours as needed for nausea or vomiting. 30 tablet 0  . pregabalin (LYRICA) 75 MG capsule Take 75 mg by mouth 2 (two) times daily.    . Vitamin D, Ergocalciferol, (DRISDOL) 1.25 MG (50000 UNIT) CAPS capsule Take 1 capsule (50,000 Units total) by mouth 2 (two) times a week. 24 capsule 0   No current facility-administered medications on file prior to visit.    No Known Allergies  Family History  Problem Relation Age of Onset  .  Alcohol abuse Mother   . Cancer Mother 59       Uterine cancer  . Drug abuse Mother   . HIV/AIDS Mother   . Early death Mother 63       uterine cancer  . Alcohol abuse Father   . Drug abuse Father   . Polymyositis Father 58       deceased at age 70  . Early death Father 45       polymyositis  . Alcohol abuse Sister   . Depression Sister   . Drug abuse Sister   . Depression Brother   . Kidney disease Maternal Grandmother        ESRD on dialysis  . Hypertension Maternal Grandmother   . Rheum arthritis Paternal Grandmother   . Rheum arthritis Brother   . Depression Sister   . Depression Sister   . Depression Sister   . Lupus Cousin   . Lupus Cousin   . Lupus Cousin   . Prostate cancer Paternal Uncle     BP 118/80 (BP  Location: Right Arm, Patient Position: Sitting, Cuff Size: Large)   Pulse 76   Ht 5\' 2"  (1.575 m)   Wt 236 lb 3.2 oz (107.1 kg)   SpO2 98%   BMI 43.20 kg/m   Review of Systems     Objective:   Physical Exam VITAL SIGNS:  See vs page GENERAL: no distress Ext: 1+ bilat leg edema.         Assessment & Plan:  transgender state.  Uncontrolled.  We discussed.  He prefers injections over topical. I did letter of support for surgery.   Patient Instructions  I have sent a prescription to your [facility] pharmacy, to refill the testosterone.   Please redo the blood tests in 4-6 weeks. No need to be fasting.  Please come back for a follow-up appointment in 6 months.

## 2020-07-03 ENCOUNTER — Other Ambulatory Visit (HOSPITAL_COMMUNITY): Payer: Self-pay

## 2020-07-04 ENCOUNTER — Ambulatory Visit (HOSPITAL_COMMUNITY)
Admission: EM | Admit: 2020-07-04 | Discharge: 2020-07-04 | Disposition: A | Discharge status: Home / Self Care | Payer: 59 | Attending: Family Medicine | Admitting: Family Medicine

## 2020-07-04 ENCOUNTER — Encounter (HOSPITAL_COMMUNITY): Payer: Self-pay

## 2020-07-04 ENCOUNTER — Other Ambulatory Visit: Payer: Self-pay

## 2020-07-04 ENCOUNTER — Other Ambulatory Visit (HOSPITAL_COMMUNITY): Payer: Self-pay

## 2020-07-04 DIAGNOSIS — I10 Essential (primary) hypertension: Secondary | ICD-10-CM | POA: Diagnosis not present

## 2020-07-04 DIAGNOSIS — M7551 Bursitis of right shoulder: Secondary | ICD-10-CM

## 2020-07-04 MED ORDER — PREDNISONE 20 MG PO TABS: 40.0000 mg | ORAL_TABLET | Freq: Every day | ORAL | 0 refills | Status: DC

## 2020-07-04 NOTE — ED Triage Notes (Signed)
Pt repots gradually right shoulder pain x 1 month, left shoulder pain x 2 days. Pt reports he can not raise his arms. 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 Tylenol, ibuprofen, Biofreeze, Voltaren gel gives no relief. Pain is worse at night.

## 2020-07-04 NOTE — Discharge Instructions (Addendum)
Continue taking ibuprofen daily with food.  Your blood pressure was noted to be elevated during your visit today. If you are currently taking medication for high blood pressure, please ensure you are taking this as directed. If you do not have a history of high blood pressure and your blood pressure remains persistently elevated, you may need to begin taking a medication at some point. You may return here within the next few days to recheck if unable to see your primary care provider or if you do not have a one.  BP (!) 150/101 (BP Location: Right Arm)   Pulse 61   Temp 99 F (37.2 C) (Oral)   Resp 18   SpO2 98%   BP Readings from Last 3 Encounters:  07/04/20 (!) 150/101  07/02/20 118/80  02/21/20 140/90

## 2020-07-05 NOTE — ED Provider Notes (Signed)
[facility]   [id number] 07/04/20 Arrival Time: 1654  ASSESSMENT & PLAN:  1. Acute bursitis of right shoulder   2. Elevated blood pressure reading in office with diagnosis of hypertension    No indication for imaging shoulder at this time. Discussed.  Continue ibuprofen with food. Begin trial of: Meds ordered this encounter  Medications  . predniSONE (DELTASONE) 20 MG tablet    Sig: Take 2 tablets (40 mg total) by mouth daily.    Dispense:  10 tablet    Refill:  0    Recommend:  Follow-up Information    [facility].   Why: If worsening or failing to improve as anticipated. Contact information: [address] [place] [address] [phone]       Nche, Bonna Gains, NP.   Specialty: Internal Medicine Why: To recheck your blood pressure. Contact information: [address] [place] [address] [phone]                 Discharge Instructions      Continue taking ibuprofen daily with food.  Your blood pressure was noted to be elevated during your visit today. If you are currently taking medication for high blood pressure, please ensure you are taking this as directed. If you do not have a history of high blood pressure and your blood pressure remains persistently elevated, you may need to begin taking a medication at some point. You may return here within the next few days to recheck if unable to see your primary care provider or if you do not have a one.  BP (!) 150/101 (BP Location: Right Arm)   Pulse 61   Temp 99 F (37.2 C) (Oral)   Resp 18   SpO2 98%   BP Readings from Last 3 Encounters:  07/04/20 (!) 150/101  07/02/20 118/80  02/21/20 140/90       Reviewed expectations re: course of current medical issues. Questions answered. Outlined signs and symptoms indicating need for more acute intervention. Patient verbalized understanding. After Visit Summary  given.  SUBJECTIVE: History from: patient. Madeline Larsen is a 35 y.o. adult who reports fairly persistent moderate pain of his right anterior shoulder; described as aching; without radiation. Onset: gradual. First noted: over past few weeks. Injury/trama: none reported but uses arms a lot at work; questions relation. Symptoms have gradually worsened since beginning. Worse at night; like toothache. No assoc CP/SOB. Aggravating factors: certain movements. Alleviating factors: have not been identified. Associated symptoms: none reported. Extremity sensation changes or weakness: none. Self treatment: NSAID, with minimal relief.  History of similar: no.  Past Surgical History:  Procedure Laterality Date  . FOOT SURGERY Left   . top surgery  02/2018    Increased blood pressure noted today. Reports that he has been told of high BP in past. No current tx. He reports no chest pain on exertion, no dyspnea on exertion, no swelling of ankles, no orthostatic dizziness or lightheadedness, no orthopnea or paroxysmal nocturnal dyspnea and no palpitations.   OBJECTIVE:  Vitals:   07/04/20 1751  BP: (!) 150/101  Pulse: 61  Resp: 18  Temp: 99 F (37.2 C)  TempSrc: Oral  SpO2: 98%    General appearance: alert; no distress HEENT: [place]; AT Neck: supple with FROM Resp: unlabored respirations Extremities: . RUE: warm with well perfused appearance; fairly well localized marked tenderness over right anterior shoulder at biceps insertion; without gross deformities; swelling: none; bruising: none;  shoudler ROM: normal, with discomfort CV: brisk extremity capillary refill of RUE; 2+ radial pulse of RUE. Skin: warm and dry; no visible rashes Neurologic: gait normal; normal sensation and strength of RUE Psychological: alert and cooperative; normal mood and affect   No Known Allergies  Past Medical History:  Diagnosis Date  . Depression   . Female-to-female transgender person   . GERD  (gastroesophageal reflux disease)   . MRSA (methicillin resistant staph aureus) culture positive    Social History   Socioeconomic History  . Marital status: Married    Spouse name: Not on file  . Number of children: 2  . Years of education: Not on file  . Highest education level: High school graduate  Occupational History  . Not on file  Tobacco Use  . Smoking status: Never Smoker  . Smokeless tobacco: Never Used  Vaping Use  . Vaping Use: Never used  Substance and Sexual Activity  . Alcohol use: Yes    Alcohol/week: 0.0 standard drinks    Comment: socially  . Drug use: Not Currently    Types: Marijuana  . Sexual activity: Yes    Birth control/protection: None    Comment: female partner  Other Topics Concern  . Not on file  Social History Narrative   Lives at home with wife & kids   Right handed   Social Determinants of Health   Financial Resource Strain: Not on file  Food Insecurity: Not on file  Transportation Needs: Not on file  Physical Activity: Not on file  Stress: Not on file  Social Connections: Not on file   Family History  Problem Relation Age of Onset  . Alcohol abuse Mother   . Cancer Mother 76       Uterine cancer  . Drug abuse Mother   . HIV/AIDS Mother   . Early death Mother 48       uterine cancer  . Alcohol abuse Father   . Drug abuse Father   . Polymyositis Father 26       deceased at age 34  . Early death Father 78       polymyositis  . Alcohol abuse Sister   . Depression Sister   . Drug abuse Sister   . Depression Brother   . Kidney disease Maternal Grandmother        ESRD on dialysis  . Hypertension Maternal Grandmother   . Rheum arthritis Paternal Grandmother   . Rheum arthritis Brother   . Depression Sister   . Depression Sister   . Depression Sister   . Lupus Cousin   . Lupus Cousin   . Lupus Cousin   . Prostate cancer Paternal Uncle    Past Surgical History:  Procedure Laterality Date  . FOOT SURGERY Left   . top  surgery  02/2018      Mardella Layman, MD 07/05/20 1030

## 2020-07-12 ENCOUNTER — Other Ambulatory Visit (HOSPITAL_COMMUNITY): Payer: Self-pay

## 2020-07-16 ENCOUNTER — Other Ambulatory Visit (HOSPITAL_COMMUNITY): Payer: Self-pay

## 2020-07-16 ENCOUNTER — Telehealth: Payer: Self-pay

## 2020-07-16 ENCOUNTER — Encounter: Payer: Self-pay | Admitting: Endocrinology

## 2020-07-16 NOTE — Telephone Encounter (Signed)
Dr. Everardo All, please clarify something with me. In the beginning for this pt, you sent in testosterone Cypionate 200mg  which was denied when I did the PA bc they stated that the pt needed to have tried Xyosted 50MG /0.5ML auto-injectors. I did a PA for this one and it was approved and now he is telling me that the medication you sent in was for the cypionate. Please clarify this bc now Im confused.

## 2020-07-17 ENCOUNTER — Other Ambulatory Visit: Payer: Self-pay

## 2020-07-17 DIAGNOSIS — R7989 Other specified abnormal findings of blood chemistry: Secondary | ICD-10-CM

## 2020-07-17 DIAGNOSIS — Z789 Other specified health status: Secondary | ICD-10-CM

## 2020-07-17 MED ORDER — TESTOSTERONE ENANTHATE 50 MG/0.5ML ~~LOC~~ SOAJ: 80.0000 mg | SUBCUTANEOUS | 0 refills | Status: DC

## 2020-07-17 NOTE — Telephone Encounter (Signed)
I have spoken to the pt regarding this.

## 2020-07-17 NOTE — Telephone Encounter (Signed)
I was told that ins liked enanthate, so I rx'ed that.  Either is fine with me.

## 2020-07-18 ENCOUNTER — Other Ambulatory Visit: Payer: Self-pay | Admitting: Endocrinology

## 2020-07-18 ENCOUNTER — Other Ambulatory Visit (HOSPITAL_COMMUNITY): Payer: Self-pay

## 2020-07-18 DIAGNOSIS — Z789 Other specified health status: Secondary | ICD-10-CM

## 2020-07-18 MED ORDER — TESTOSTERONE ENANTHATE 50 MG/0.5ML ~~LOC~~ SOAJ: 80.0000 mg | SUBCUTANEOUS | 0 refills | Status: DC

## 2020-07-19 ENCOUNTER — Other Ambulatory Visit (HOSPITAL_COMMUNITY): Payer: Self-pay

## 2020-07-23 ENCOUNTER — Encounter: Payer: Self-pay | Admitting: Nurse Practitioner

## 2020-07-24 ENCOUNTER — Other Ambulatory Visit (HOSPITAL_COMMUNITY): Payer: Self-pay

## 2020-07-26 ENCOUNTER — Telehealth: Payer: Self-pay | Admitting: *Deleted

## 2020-07-26 NOTE — Telephone Encounter (Signed)
See phone encounter.

## 2020-07-26 NOTE — Telephone Encounter (Signed)
Madeline Larsen    8:19 AM Note Follow up:     Patient calling to see if her sleep study was received my our office from [facility],  Patient best number [phone]. 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       Will forward to medical records to complete.   Sleep study completed 03/01/2019

## 2020-07-26 NOTE — Telephone Encounter (Signed)
Follow up:     Patient calling to see if her sleep study was received my our office from [facility],  Patient best number [phone]. 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

## 2020-07-30 NOTE — Progress Notes (Deleted)
Office Visit Note  Patient: Madeline Larsen             Date of Birth: Oct 01, 1985           MRN: [id number]             PCP: Anne Ng, NP Referring: Anne Ng, NP Visit Date: 08/13/2020 Occupation: @GUAROCC @  Subjective:  No chief complaint on file.   History of Present Illness: Madeline Larsen is a 35 y.o. adult ***   Activities of Daily Living:  Patient reports morning stiffness for *** {minute/hour:19697}.   Patient {ACTIONS;DENIES/REPORTS:21021675::"Denies"} nocturnal pain.  Difficulty dressing/grooming: {ACTIONS;DENIES/REPORTS:21021675::"Denies"} Difficulty climbing stairs: {ACTIONS;DENIES/REPORTS:21021675::"Denies"} Difficulty getting out of chair: {ACTIONS;DENIES/REPORTS:21021675::"Denies"} Difficulty using hands for taps, buttons, cutlery, and/or writing: {ACTIONS;DENIES/REPORTS:21021675::"Denies"}  No Rheumatology ROS completed.   PMFS History:  Patient Active Problem List   Diagnosis Date Noted  . Hyperthyroidism 07/02/2020  . S/P TAH-BSO (total abdominal hysterectomy and bilateral salpingo-oophorectomy) 05/24/2020  . Leukopenia 05/26/2019  . S/P mastectomy, bilateral 05/04/2019  . Low TSH level 05/04/2019  . Hyperglycemia 05/04/2019  . Essential hypertension 01/25/2019  . Chronic idiopathic constipation 01/04/2019  . GERD (gastroesophageal reflux disease) 12/28/2018  . Myalgia 12/16/2018  . Polyarthralgia 12/16/2018  . ANA positive 12/16/2018  . Neutropenia (HCC) 01/27/2018  . 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 Headache 06/18/2017  . 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 Status post left foot surgery 04/09/2015  . 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 Depression   . Female-to-female transgender person   . GERD (gastroesophageal reflux disease)   . MRSA (methicillin resistant staph aureus) culture positive     Family History  Problem Relation Age of Onset  . Alcohol  abuse Mother   . Cancer Mother 50       Uterine cancer  . Drug abuse Mother   . HIV/AIDS Mother   . Early death Mother 54       uterine cancer  . Alcohol abuse Father   . Drug abuse Father   . Polymyositis Father 81       deceased at age 71  . Early death Father 37       polymyositis  . Alcohol abuse Sister   . Depression Sister   . Drug abuse Sister   . Depression Brother   . Kidney disease Maternal Grandmother        ESRD on dialysis  . Hypertension Maternal Grandmother   . Rheum arthritis Paternal Grandmother   . Rheum arthritis Brother   . Depression Sister   . Depression Sister   . Depression Sister   . Lupus Cousin   . Lupus Cousin   . Lupus Cousin   . Prostate cancer Paternal Uncle    Past Surgical History:  Procedure Laterality Date  . FOOT SURGERY Left   . top surgery  02/2018   Social History   Social History Narrative   Lives at home with wife & kids   Right handed   Immunization History  Administered Date(s) Administered  . Influenza,inj,Quad PF,6+ Mos 12/23/2019  . PFIZER(Purple Top)SARS-COV-2 Vaccination 12/13/2019, 01/03/2020  . Tdap 01/26/2018     Objective: Vital Signs: There were no vitals taken for this visit.   Physical Exam   Musculoskeletal Exam: ***  CDAI Exam: CDAI Score: -- Patient Global: --; Provider Global: -- Swollen: --; Tender: -- Joint Exam 08/13/2020   No joint exam has been documented for this visit  There is currently no information documented on the homunculus. Go to the Rheumatology activity and complete the homunculus joint exam.  Investigation: No additional findings.  Imaging: No results found.  Recent Labs: Lab Results  Component Value Date   WBC 3.3 (L) 12/27/2019   HGB 11.8 12/27/2019   PLT 210 12/27/2019   NA 138 12/27/2019   K 4.3 12/27/2019   CL 105 12/27/2019   CO2 28 12/27/2019   GLUCOSE 80 12/27/2019   BUN 12 12/27/2019   CREATININE 0.63 12/27/2019   BILITOT 0.5 12/27/2019   ALKPHOS  63 06/07/2019   AST 16 12/27/2019   ALT 10 12/27/2019   PROT 6.7 12/27/2019   ALBUMIN 4.0 06/07/2019   CALCIUM 9.0 12/27/2019   GFRAA 135 12/27/2019    Speciality Comments: No specialty comments available.  Procedures:  No procedures performed Allergies: Patient has no known allergies.   Assessment / Plan:     Visit Diagnoses: No diagnosis found.  Orders: No orders of the defined types were placed in this encounter.  No orders of the defined types were placed in this encounter.   Face-to-face time spent with patient was *** minutes. Greater than 50% of time was spent in counseling and coordination of care.  Follow-Up Instructions: No follow-ups on file.   Ellen Henri, CMA  Note - This record has been created using Animal nutritionist.  Chart creation errors have been sought, but may not always  have been located. Such creation errors do not reflect on  the standard of medical care.

## 2020-08-10 ENCOUNTER — Other Ambulatory Visit: Payer: 59

## 2020-08-10 ENCOUNTER — Other Ambulatory Visit (HOSPITAL_COMMUNITY): Payer: Self-pay

## 2020-08-13 ENCOUNTER — Ambulatory Visit: Payer: 59 | Admitting: Rheumatology

## 2020-08-13 DIAGNOSIS — Z79899 Other long term (current) drug therapy: Secondary | ICD-10-CM

## 2020-08-13 DIAGNOSIS — M791 Myalgia, unspecified site: Secondary | ICD-10-CM

## 2020-08-13 DIAGNOSIS — Z9889 Other specified postprocedural states: Secondary | ICD-10-CM

## 2020-08-13 DIAGNOSIS — Z789 Other specified health status: Secondary | ICD-10-CM

## 2020-08-13 DIAGNOSIS — R748 Abnormal levels of other serum enzymes: Secondary | ICD-10-CM

## 2020-08-13 DIAGNOSIS — M170 Bilateral primary osteoarthritis of knee: Secondary | ICD-10-CM

## 2020-08-13 DIAGNOSIS — G4709 Other insomnia: Secondary | ICD-10-CM

## 2020-08-13 DIAGNOSIS — Z8269 Family history of other diseases of the musculoskeletal system and connective tissue: Secondary | ICD-10-CM

## 2020-08-13 DIAGNOSIS — E559 Vitamin D deficiency, unspecified: Secondary | ICD-10-CM

## 2020-08-13 DIAGNOSIS — R5383 Other fatigue: Secondary | ICD-10-CM

## 2020-08-13 DIAGNOSIS — Z840 Family history of diseases of the skin and subcutaneous tissue: Secondary | ICD-10-CM

## 2020-08-13 DIAGNOSIS — R768 Other specified abnormal immunological findings in serum: Secondary | ICD-10-CM

## 2020-09-06 ENCOUNTER — Other Ambulatory Visit: Payer: Self-pay

## 2021-01-02 ENCOUNTER — Ambulatory Visit (INDEPENDENT_AMBULATORY_CARE_PROVIDER_SITE_OTHER): Payer: Self-pay | Admitting: Endocrinology

## 2021-01-02 ENCOUNTER — Other Ambulatory Visit: Payer: Self-pay

## 2021-01-02 VITALS — BP 120/70 | HR 70 | Ht 62.0 in | Wt 241.0 lb

## 2021-01-02 DIAGNOSIS — Z789 Other specified health status: Secondary | ICD-10-CM

## 2021-01-02 DIAGNOSIS — R7989 Other specified abnormal findings of blood chemistry: Secondary | ICD-10-CM

## 2021-01-02 MED ORDER — TESTOSTERONE CYPIONATE 200 MG/ML IM SOLN: 80.0000 mg | INTRAMUSCULAR | 0 refills | Status: DC

## 2021-01-02 NOTE — Patient Instructions (Addendum)
[facility] to [facility].   Please redo the blood tests in 6 weeks. No need to be fasting.   Please come back for a follow-up appointment in 6 months.

## 2021-01-02 NOTE — Progress Notes (Signed)
Subjective:    Patient ID: Madeline Larsen, adult    DOB: 1985-09-06, 35 y.o.   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   Interval hx:  He ran out of depo-testosterone 3 weeks ago.  pt states he feels well in general, and he feels the testosterone is helping.  He has intermitt menses.  He saw [facility], who plans TAH/BSO.   Pt also has hyperthyroidism (dx'ed 2020; he has never been on rx for this; f/u TSH was normal).   Past Medical History:  Diagnosis Date   Depression    Female-to-female transgender person    GERD (gastroesophageal reflux disease)    MRSA (methicillin resistant staph aureus) culture positive     Past Surgical History:  Procedure Laterality Date   FOOT SURGERY Left    top surgery  02/2018    Social History   Socioeconomic History   Marital status: Married    Spouse name: Not on file   Number of children: 2   Years of education: Not on file   Highest education level: High school graduate  Occupational History   Not on file  Tobacco Use   Smoking status: Never   Smokeless tobacco: Never  Vaping Use   Vaping Use: Never used  Substance and Sexual Activity   Alcohol use: Yes    Alcohol/week: 0.0 standard drinks    Comment: socially   Drug use: Not Currently    Types: Marijuana   Sexual activity: Yes    Birth control/protection: None    Comment: female partner  Other Topics Concern   Not on file  Social History Narrative   Lives at home with wife & kids   Right handed   Social Determinants of Health   Financial Resource Strain: Not on file  Food Insecurity: Not on file  Transportation Needs: Not on file  Physical Activity: Not on file  Stress: Not on file  Social Connections: Not on file  Intimate  Partner Violence: Not on file    Current Outpatient Medications on File Prior to Visit  Medication Sig Dispense Refill   acetaminophen (TYLENOL) 500 MG tablet Take 500 mg by mouth every 6 (six) hours as needed.     diclofenac Sodium (VOLTAREN) 1 % GEL Apply topically 4 (four) times daily.     DULoxetine (CYMBALTA) 30 MG capsule Take 1 capsule (30 mg total) by mouth 2 (two) times daily. 180 capsule 3   etodolac (LODINE) 400 MG tablet Take 1 tablet (400 mg total) by mouth 2 (two) times daily. 60 tablet 0   hydroxychloroquine (PLAQUENIL) 200 MG tablet Take 1 tablet (200 mg total) by mouth 2 (two) times daily. 180 tablet 0   ibuprofen (ADVIL) 200 MG tablet Take 200 mg by mouth every 6 (six) hours as needed.     lubiprostone (AMITIZA) 8 MCG capsule Take 3 capsules (24 mcg total) by mouth daily. 90 capsule 5   Menthol, Topical Analgesic, (BIOFREEZE EX) Apply topically.     NEEDLE, DISP, 23 G 23G X 1" MISC Use as directed 10 each 0   olmesartan (BENICAR) 5 MG tablet Take 2 tablets (10 mg total) by mouth every morning. 180 tablet 3   ondansetron (ZOFRAN ODT)  8 MG disintegrating tablet Take 1 tablet (8 mg total) by mouth every 8 (eight) hours as needed for nausea or vomiting. 30 tablet 0   pregabalin (LYRICA) 75 MG capsule Take 75 mg by mouth 2 (two) times daily.     SYRINGE-NEEDLE, DISP, 3 ML 18G X 1-1/2" 3 ML MISC Use as directed. 10 each 0   Vitamin D, Ergocalciferol, (DRISDOL) 1.25 MG (50000 UNIT) CAPS capsule Take 1 capsule (50,000 Units total) by mouth 2 (two) times a week. 24 capsule 0   No current facility-administered medications on file prior to visit.    No Known Allergies  Family History  Problem Relation Age of Onset   Alcohol abuse Mother    Cancer Mother 30       Uterine cancer   Drug abuse Mother    HIV/AIDS Mother    Early death Mother 68       uterine cancer   Alcohol abuse Father    Drug abuse Father    Polymyositis Father 31       deceased at age 28   Early death  Father 51       polymyositis   Alcohol abuse Sister    Depression Sister    Drug abuse Sister    Depression Brother    Kidney disease Maternal Grandmother        ESRD on dialysis   Hypertension Maternal Grandmother    Rheum arthritis Paternal Grandmother    Rheum arthritis Brother    Depression Sister    Depression Sister    Depression Sister    Lupus Cousin    Lupus Cousin    Lupus Cousin    Prostate cancer Paternal Uncle     BP 120/70 (BP Location: Right Arm, Patient Position: Sitting, Cuff Size: Large)   Pulse 70   Ht 5\' 2"  (1.575 m)   Wt 241 lb (109.3 kg)   SpO2 97%   BMI 44.08 kg/m    Review of Systems No recent menses.      Objective:   Physical Exam VITAL SIGNS:  See vs page GENERAL: no distress.   Ext: 1+ bilat leg edema.        Assessment & Plan:  Transgender state: uncontrolled.   Patient Instructions  I have sent a prescription to [facility], to refill the testosterone.   Please redo the blood tests in 6 weeks. No need to be fasting.   Please come back for a follow-up appointment in 6 months.

## 2021-01-03 ENCOUNTER — Other Ambulatory Visit (HOSPITAL_COMMUNITY): Payer: Self-pay

## 2021-01-03 ENCOUNTER — Telehealth: Payer: Self-pay | Admitting: Pharmacy Technician

## 2021-01-03 NOTE — Telephone Encounter (Signed)
Patient Advocate Encounter  Received notification from [employer] that prior authorization for TESTOSTERONE 200MG /1ML is required.   PA submitted on  Key  Status is pending-WAITING FOR NEW INSURANCE   [facility] will continue to follow  , CPhT Patient Advocate [facility] Phone: [phone] Fax:  [phone]

## 2021-01-03 NOTE — Telephone Encounter (Signed)
Patient called back and will be paying out of pocket for Testosterone. No PA will need to be submitted.

## 2021-03-06 ENCOUNTER — Other Ambulatory Visit: Payer: Self-pay

## 2021-04-19 ENCOUNTER — Encounter: Payer: Self-pay | Admitting: Endocrinology

## 2021-07-03 ENCOUNTER — Ambulatory Visit: Payer: Self-pay | Admitting: Endocrinology

## 2021-11-15 ENCOUNTER — Encounter: Payer: Self-pay | Admitting: Internal Medicine

## 2021-11-15 ENCOUNTER — Ambulatory Visit (INDEPENDENT_AMBULATORY_CARE_PROVIDER_SITE_OTHER): Payer: Self-pay | Admitting: Internal Medicine

## 2021-11-15 VITALS — BP 130/72 | HR 66 | Ht 62.0 in | Wt 262.0 lb

## 2021-11-15 DIAGNOSIS — Z8639 Personal history of other endocrine, nutritional and metabolic disease: Secondary | ICD-10-CM

## 2021-11-15 DIAGNOSIS — F649 Gender identity disorder, unspecified: Secondary | ICD-10-CM

## 2021-11-15 LAB — COMPREHENSIVE METABOLIC PANEL
ALT: 17 U/L (ref 0–35)
AST: 20 U/L (ref 0–37)
Albumin: 3.7 g/dL (ref 3.5–5.2)
Alkaline Phosphatase: 59 U/L (ref 39–117)
BUN: 13 mg/dL (ref 6–23)
CO2: 27 mEq/L (ref 19–32)
Calcium: 9 mg/dL (ref 8.4–10.5)
Chloride: 104 mEq/L (ref 96–112)
Creatinine, Ser: 0.9 mg/dL (ref 0.40–1.20)
GFR: 81.56 mL/min (ref >60.00)
Glucose, Bld: 82 mg/dL (ref 70–99)
Potassium: 4.1 mEq/L (ref 3.5–5.1)
Sodium: 137 mEq/L (ref 135–145)
Total Bilirubin: 0.3 mg/dL (ref 0.2–1.2)
Total Protein: 7.3 g/dL (ref 6.0–8.3)

## 2021-11-15 LAB — T4, FREE: Free T4: 0.65 ng/dL (ref 0.60–1.60)

## 2021-11-15 LAB — CBC
HCT: 34.9 % — ABNORMAL LOW (ref 36.0–46.0)
Hemoglobin: 11.4 g/dL — ABNORMAL LOW (ref 12.0–15.0)
MCHC: 32.8 g/dL (ref 30.0–36.0)
MCV: 88.9 fl (ref 78.0–100.0)
Platelets: 242 10*3/uL (ref 150.0–400.0)
RBC: 3.92 Mil/uL (ref 3.87–5.11)
RDW: 13.3 % (ref 11.5–15.5)
WBC: 3 10*3/uL — ABNORMAL LOW (ref 4.0–10.5)

## 2021-11-15 LAB — TESTOSTERONE: Testosterone: 26.99 ng/dL (ref 15.00–40.00)

## 2021-11-15 LAB — TSH: TSH: 0.54 u[IU]/mL (ref 0.35–5.50)

## 2021-11-15 NOTE — Progress Notes (Unsigned)
Name: Madeline Larsen  MRN/ DOB: [id number], 27-Apr-1985    Age/ Sex: 36 y.o., adult     PCP: Nche, Bonna Gains, NP   Reason for Endocrinology Evaluation: Gender dysphoria/hyperthyroidism     Initial Endocrinology Clinic Visit: 06/18/2017    PATIENT IDENTIFIER: Madeline Larsen is a 36 y.o., adult with a past medical history of HTN, GERD, and gender dysphoria. He has followed with [facility] since 06/18/2017 for consultative assistance with management of his gender dysphoria.   HISTORICAL SUMMARY: The patient was first diagnosed with gender dysphoria in the past, started gender affirming medical treatment in 2015.  Patient is s/p bilateral mastectomy in 2019   Has followed up with Dr. Everardo All from 2019 until October 2022    HYPERTHYROID HISTORY: Patient has been noted with intermittent low TSH since 2020.  No thionamides treatment was needed.    SUBJECTIVE:    Today (11/15/2021):  Mr. Selkirk is here for a follow-up on gender dysphoria and hyperthyroidism.    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  Has not had hysterectomies  Has noted resumption of menstruations over the past 2 months  Denies palpitations  Denies loose stools or diarrhea  Denies rash with testosterone injections  Denies local neck swelling  Has noted weight gain  Denies acne   Denies personal or FH of DVT   Testosterone  80 mg  weekly ( Thursday)   HISTORY:  Past Medical History:  Past Medical History:  Diagnosis Date   Depression    Female-to-female transgender person    GERD (gastroesophageal reflux disease)    MRSA (methicillin resistant staph aureus) culture positive    Past Surgical History:  Past Surgical History:  Procedure Laterality Date   FOOT SURGERY Left    top surgery  02/2018   Social History:  reports that he has never smoked. He has never used smokeless tobacco. He reports current alcohol use. He  reports that he does not currently use drugs after having used the following drugs: Marijuana. Family History:  Family History  Problem Relation Age of Onset   Alcohol abuse Mother    Cancer Mother 41       Uterine cancer   Drug abuse Mother    HIV/AIDS Mother    Early death Mother 76       uterine cancer   Alcohol abuse Father    Drug abuse Father    Polymyositis Father 40       deceased at age 70   Early death Father 50       polymyositis   Alcohol abuse Sister    Depression Sister    Drug abuse Sister    Depression Brother    Kidney disease Maternal Grandmother        ESRD on dialysis   Hypertension Maternal Grandmother    Rheum arthritis Paternal Grandmother    Rheum arthritis Brother    Depression Sister    Depression Sister    Depression Sister    Lupus Cousin    Lupus Cousin    Lupus Cousin    Prostate cancer Paternal Uncle      HOME MEDICATIONS: Allergies as of 11/15/2021   No Known Allergies      Medication List        Accurate as of November 15, 2021 11:23 AM. If you have any questions, ask your nurse or doctor.  acetaminophen 500 MG tablet Commonly known as: TYLENOL Take 500 mg by mouth every 6 (six) hours as needed.   BIOFREEZE EX Apply topically.   diclofenac Sodium 1 % Gel Commonly known as: VOLTAREN Apply topically 4 (four) times daily.   DULoxetine 30 MG capsule Commonly known as: CYMBALTA Take 1 capsule (30 mg total) by mouth 2 (two) times daily.   etodolac 400 MG tablet Commonly known as: LODINE Take 1 tablet (400 mg total) by mouth 2 (two) times daily.   hydroxychloroquine 200 MG tablet Commonly known as: PLAQUENIL Take 1 tablet (200 mg total) by mouth 2 (two) times daily.   ibuprofen 200 MG tablet Commonly known as: ADVIL Take 200 mg by mouth every 6 (six) hours as needed.   lubiprostone 8 MCG capsule Commonly known as: Amitiza Take 3 capsules (24 mcg total) by mouth daily.   NEEDLE (DISP) 23 G 23G X 1"  Misc Use as directed   olmesartan 5 MG tablet Commonly known as: BENICAR Take 2 tablets (10 mg total) by mouth every morning.   ondansetron 8 MG disintegrating tablet Commonly known as: Zofran ODT Take 1 tablet (8 mg total) by mouth every 8 (eight) hours as needed for nausea or vomiting.   pregabalin 75 MG capsule Commonly known as: LYRICA Take 75 mg by mouth 2 (two) times daily.   SYRINGE-NEEDLE (DISP) 3 ML 18G X 1-1/2" 3 ML Misc Use as directed.   testosterone cypionate 200 MG/ML injection Commonly known as: Depo-Testosterone Inject 0.4 mLs (80 mg total) into the muscle once a week. And syringes 1/week   Vitamin D (Ergocalciferol) 1.25 MG (50000 UNIT) Caps capsule Commonly known as: DRISDOL Take 1 capsule (50,000 Units total) by mouth 2 (two) times a week.          OBJECTIVE:   PHYSICAL EXAM: VS: BP 130/72 (BP Location: Left Arm, Patient Position: Sitting, Cuff Size: Large)   Pulse 66   Ht 5\' 2"  (1.575 m)   Wt 262 lb (118.8 kg)   SpO2 99%   BMI 47.92 kg/m    EXAM: General: Pt appears well and is in NAD  Eyes: External eye exam normal without stare, lid lag or exophthalmos.  EOM intact.    Neck: General: Supple without adenopathy. Thyroid: Thyroid size normal.  No goiter or nodules appreciated.   Lungs: Clear with good BS bilat with no rales, rhonchi, or wheezes  Heart: Auscultation: RRR.  Abdomen: Normoactive bowel sounds, soft, nontender, without masses or organomegaly palpable  Extremities:  BL LE: No pretibial edema normal ROM and strength.  Mental Status: Judgment, insight: Intact Orientation: Oriented to time, place, and person Mood and affect: No depression, anxiety, or agitation     DATA REVIEWED:  Latest Reference Range & Units 11/15/21 14:12  Sodium 135 - 145 mEq/L 137  Potassium 3.5 - 5.1 mEq/L 4.1  Chloride 96 - 112 mEq/L 104  CO2 19 - 32 mEq/L 27  Glucose 70 - 99 mg/dL 82  BUN 6 - 23 mg/dL 13  Creatinine 01/15/22 - 0.26 mg/dL 3.78   Calcium 8.4 - 5.88 mg/dL 9.0  Alkaline Phosphatase 39 - 117 U/L 59  Albumin 3.5 - 5.2 g/dL 3.7  AST 0 - 37 U/L 20  ALT 0 - 35 U/L 17  Total Protein 6.0 - 8.3 g/dL 7.3  Total Bilirubin 0.2 - 1.2 mg/dL 0.3  GFR 50.2 mL/min 81.56     Latest Reference Range & Units 11/15/21 14:12  WBC 4.0 - 10.5 K/uL  3.0 (L)  RBC 3.87 - 5.11 Mil/uL 3.92  Hemoglobin 12.0 - 15.0 g/dL 02.4 (L)  HCT 09.7 - 35.3 % 34.9 (L)  MCV 78.0 - 100.0 fl 88.9  MCHC 30.0 - 36.0 g/dL 29.9  RDW 24.2 - 68.3 % 13.3  Platelets 150.0 - 400.0 K/uL 242.0    Latest Reference Range & Units 11/15/21 14:12  Testosterone 15.00 - 40.00 ng/dL 41.96  TSH 2.22 - 9.79 uIU/mL 0.54  T4,Free(Direct) 0.60 - 1.60 ng/dL 8.92    ASSESSMENT / PLAN / RECOMMENDATIONS:   Gender Dysphoria :  -Female to female -Patient with inconsistent intake of testosterone replacement therapy, when he saw Dr. Everardo All in October 2022 he indicated that he was not taking testosterone.  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  I did also explain to the patient that if inconsistent intake continues, I would not be able to prescribe intramuscular testosterone and the patient will have to switch to topical -We also discussed the risk of abuse with testosterone and the importance of taking this and monitoring it appropriately -I will switch testosterone intake to every 14 days for ease of taking (instead of weekly)  Medications  Start Testosterone cypionate 200/ml, 100 mg Q 14 days    2. Hx Hyperthyroidism:   -Patient is clinically euthyroid -TFTs are normal      Follow-up in 6 months   Signed electronically by: Lyndle Herrlich, MD  [facility]   [facility] [address] [place], [place] [address] Phone: [phone] FAX: [phone]      CC: Anne Ng, NP [address] [place] [address] Phone: [phone]  Fax: [phone]   Return to Endocrinology clinic as below: Future Appointments  Date Time Provider Department Center  11/15/2021  1:40 PM Azlyn Wingler, Konrad Dolores, MD [id number] None

## 2021-11-17 MED ORDER — TESTOSTERONE CYPIONATE 200 MG/ML IM SOLN: 100.0000 mg | INTRAMUSCULAR | 5 refills | Status: DC

## 2021-11-17 MED ORDER — "SYRINGE/NEEDLE (DISP) 18G X 1-1/2"" 3 ML MISC": 3 refills | Status: DC

## 2022-02-05 ENCOUNTER — Ambulatory Visit: Payer: Self-pay | Admitting: Internal Medicine

## 2022-05-16 ENCOUNTER — Encounter: Payer: Self-pay | Admitting: Internal Medicine

## 2022-05-16 ENCOUNTER — Ambulatory Visit (INDEPENDENT_AMBULATORY_CARE_PROVIDER_SITE_OTHER): Payer: Self-pay | Admitting: Internal Medicine

## 2022-05-16 VITALS — BP 124/70 | HR 91 | Ht 62.0 in | Wt 269.0 lb

## 2022-05-16 DIAGNOSIS — F649 Gender identity disorder, unspecified: Secondary | ICD-10-CM

## 2022-05-16 NOTE — Progress Notes (Signed)
Name: Madeline Larsen  MRN/ DOB: [id number], [birth year]    Age/ Sex: 37 y.o., adult     PCP: Larsen, Madeline Brooke, NP   Reason for Endocrinology Evaluation: Gender dysphoria/hyperthyroidism     Initial Endocrinology Clinic Visit: 06/18/2017    PATIENT IDENTIFIER: Madeline Larsen is a 37 y.o., adult with a past medical history of HTN, GERD, and gender dysphoria. He has followed with [facility] since 06/18/2017 for consultative assistance with management of his gender dysphoria.   HISTORICAL SUMMARY: The patient was first diagnosed with gender dysphoria in the past, started gender affirming medical treatment in 2015.  Patient is s/p bilateral mastectomy in 2019   Has followed up with Dr. Loanne Larsen from 2019 until October 2022    HYPERTHYROID HISTORY: Patient has been noted with intermittent low TSH since 2020.  No thionamides treatment was needed.    SUBJECTIVE:    Today (05/16/2022):  Madeline Larsen is here for a follow-up on gender dysphoria and hyperthyroidism.   Presented to the ED with rash and hand swelling and pruritus and dx with for dermatomyositis , resolved with steroids .  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, he currently has a dietitian as well as a Economist to his work and has been helping him with improving his lifestyle  Denies palpitations  Denies constipation  or diarrhea  Denies local neck swelling  Denies acne  Stable hair growth  LMP > 2 months ago   Last dose of testosterone was last Thursday   Testosterone  100 mg (0.5 mL)  every 2 weeks   HISTORY:  Past Medical History:  Past Medical History:  Diagnosis Date   Depression    Female-to-female transgender person    GERD (gastroesophageal reflux disease)    MRSA (methicillin resistant staph aureus) culture positive    Past Surgical History:  Past Surgical History:  Procedure Laterality Date   FOOT SURGERY Left    top surgery  02/2018    Social History:  reports that he has never smoked. He has never used smokeless tobacco. He reports current alcohol use. He reports that he does not currently use drugs after having used the following drugs: Marijuana. Family History:  Family History  Problem Relation Age of Onset   Alcohol abuse Mother    Cancer Mother 43       Uterine cancer   Drug abuse Mother    HIV/AIDS Mother    Early death Mother 66       uterine cancer   Alcohol abuse Father    Drug abuse Father    Polymyositis Father 61       deceased at age 52   Early death Father 26       polymyositis   Alcohol abuse Sister    Depression Sister    Drug abuse Sister    Depression Brother    Kidney disease Maternal Grandmother        ESRD on dialysis   Hypertension Maternal Grandmother    Rheum arthritis Paternal Grandmother    Rheum arthritis Brother    Depression Sister    Depression Sister    Depression Sister    Lupus Cousin    Lupus Cousin    Lupus Cousin    Prostate cancer Paternal Uncle      HOME MEDICATIONS: Allergies as of 05/16/2022   No Known Allergies      Medication List  Accurate as of May 16, 2022  2:13 PM. If you have any questions, ask your nurse or doctor.          STOP taking these medications    acetaminophen 500 MG tablet Commonly known as: TYLENOL Stopped by: Dorita Sciara, MD   BIOFREEZE EX Stopped by: Dorita Sciara, MD   diclofenac Sodium 1 % Gel Commonly known as: VOLTAREN Stopped by: Dorita Sciara, MD   DULoxetine 30 MG capsule Commonly known as: CYMBALTA Stopped by: Dorita Sciara, MD   etodolac 400 MG tablet Commonly known as: LODINE Stopped by: Dorita Sciara, MD   hydroxychloroquine 200 MG tablet Commonly known as: PLAQUENIL Stopped by: Dorita Sciara, MD   ibuprofen 200 MG tablet Commonly known as: ADVIL Stopped by: Dorita Sciara, MD   lubiprostone 8 MCG capsule Commonly known as:  Amitiza Stopped by: Dorita Sciara, MD   olmesartan 5 MG tablet Commonly known as: BENICAR Stopped by: Dorita Sciara, MD   ondansetron 8 MG disintegrating tablet Commonly known as: Zofran ODT Stopped by: Dorita Sciara, MD   pregabalin 75 MG capsule Commonly known as: LYRICA Stopped by: Dorita Sciara, MD   Vitamin D (Ergocalciferol) 1.25 MG (50000 UNIT) Caps capsule Commonly known as: DRISDOL Stopped by: Dorita Sciara, MD       TAKE these medications    NEEDLE (DISP) 23 G 23G X 1" Misc Use as directed   SYRINGE-NEEDLE (DISP) 3 ML 18G X 1-1/2" 3 ML Misc Use as directed.   testosterone cypionate 200 MG/ML injection Commonly known as: Depo-Testosterone Inject 0.5 mLs (100 mg total) into the muscle every 14 (fourteen) days. And syringes 1/week          OBJECTIVE:   PHYSICAL EXAM: VS: BP 124/70 (BP Location: Left Arm, Patient Position: Sitting, Cuff Size: Large)   Pulse 91   Ht '5\' 2"'$  (1.575 m)   Wt 269 lb (122 kg)   SpO2 99%   BMI 49.20 kg/m    EXAM: General: Pt appears well and is in NAD  Eyes: External eye exam normal without stare, lid lag or exophthalmos.  EOM intact.    Neck: General: Supple without adenopathy. Thyroid: Thyroid size normal.  No goiter or nodules appreciated.   Lungs: Clear with good BS bilat   Heart: Auscultation: RRR.  Abdomen: Normoactive bowel sounds, soft, nontender, without masses or organomegaly palpable  Extremities:  BL LE: No pretibial edema   Mental Status: Judgment, insight: Intact Orientation: Oriented to time, place, and person Mood and affect: No depression, anxiety, or agitation     DATA REVIEWED:   Latest Reference Range & Units 05/16/22 14:35  Sodium 135 - 146 mmol/L 139  Potassium 3.5 - 5.3 mmol/L 4.6  Chloride 98 - 110 mmol/L 105  CO2 20 - 32 mmol/L 24  Glucose 65 - 99 mg/dL 85  BUN 7 - 25 mg/dL 11  Creatinine 0.50 - 0.97 mg/dL 0.89  Calcium 8.6 - 10.2 mg/dL 9.5   BUN/Creatinine Ratio 6 - 22 (calc) SEE NOTE:  AG Ratio 1.0 - 2.5 (calc) 1.4  AST 10 - 30 U/L 17  ALT 6 - 29 U/L 15  Total Protein 6.1 - 8.1 g/dL 7.1    Latest Reference Range & Units 05/16/22 14:35  WBC 3.8 - 10.8 Thousand/uL 3.7 (L)  RBC 3.80 - 5.10 Million/uL 5.05  Hemoglobin 11.7 - 15.5 g/dL 14.4  HCT 35.0 - 45.0 % 43.6  MCV 80.0 -  100.0 fL 86.3  MCH 27.0 - 33.0 pg 28.5  MCHC 32.0 - 36.0 g/dL 33.0  RDW 11.0 - 15.0 % 12.7  Platelets 140 - 400 Thousand/uL 316  MPV 7.5 - 12.5 fL 10.6  (L): Data is abnormally low    Latest Reference Range & Units 11/15/21 14:12  Testosterone 15.00 - 40.00 ng/dL 26.99  TSH 0.35 - 5.50 uIU/mL 0.54  T4,Free(Direct) 0.60 - 1.60 ng/dL 0.65    ASSESSMENT / PLAN / RECOMMENDATIONS:   Gender Dysphoria :  -Female to female -Patient with inconsistent intake of testosterone replacement therapy, in the past  - Awaiting testosterone level for today     Medications  Testosterone cypionate 200/ml, 100 mg Q 14 days    Follow-up in 6 months   Signed electronically by: Mack Guise, MD  [facility]  [facility] [address], [place] [address] Phone: [phone] FAX: [phone]      CC: [address], [place] [address] Phone: [phone]  Fax: [phone]   Return to Endocrinology clinic as below: No future appointments.

## 2022-05-19 MED ORDER — "SYRINGE/NEEDLE (DISP) 18G X 1-1/2"" 3 ML MISC": 3 refills | Status: DC

## 2022-05-19 MED ORDER — TESTOSTERONE CYPIONATE 200 MG/ML IM SOLN: 100.0000 mg | INTRAMUSCULAR | 5 refills | Status: DC

## 2022-05-20 LAB — COMPREHENSIVE METABOLIC PANEL
AG Ratio: 1.4 (calc) (ref 1.0–2.5)
ALT: 15 U/L (ref 6–29)
AST: 17 U/L (ref 10–30)
Albumin: 4.1 g/dL (ref 3.6–5.1)
Alkaline phosphatase (APISO): 52 U/L (ref 31–125)
BUN: 11 mg/dL (ref 7–25)
CO2: 24 mmol/L (ref 20–32)
Calcium: 9.5 mg/dL (ref 8.6–10.2)
Chloride: 105 mmol/L (ref 98–110)
Creat: 0.89 mg/dL (ref 0.50–0.97)
Globulin: 3 g/dL (calc) (ref 1.9–3.7)
Glucose, Bld: 85 mg/dL (ref 65–99)
Potassium: 4.6 mmol/L (ref 3.5–5.3)
Sodium: 139 mmol/L (ref 135–146)
Total Bilirubin: 0.3 mg/dL (ref 0.2–1.2)
Total Protein: 7.1 g/dL (ref 6.1–8.1)

## 2022-05-20 LAB — CBC
HCT: 43.6 % (ref 35.0–45.0)
Hemoglobin: 14.4 g/dL (ref 11.7–15.5)
MCH: 28.5 pg (ref 27.0–33.0)
MCHC: 33 g/dL (ref 32.0–36.0)
MCV: 86.3 fL (ref 80.0–100.0)
MPV: 10.6 fL (ref 7.5–12.5)
Platelets: 316 10*3/uL (ref 140–400)
RBC: 5.05 10*6/uL (ref 3.80–5.10)
RDW: 12.7 % (ref 11.0–15.0)
WBC: 3.7 10*3/uL — ABNORMAL LOW (ref 3.8–10.8)

## 2022-05-20 LAB — TESTOSTERONE, TOTAL, LC/MS/MS: Testosterone, Total, LC-MS-MS: 583 ng/dL — ABNORMAL HIGH (ref 2–45)

## 2022-11-17 NOTE — Progress Notes (Unsigned)
Name: [name]  MRN/ DOB: [id number], [birth year]    Age/ Sex: 37 y.o., adult     PCP: Nche, Bonna Gains, NP   Reason for Endocrinology Evaluation: Gender dysphoria/hyperthyroidism     Initial Endocrinology Clinic Visit: 06/18/2017    PATIENT IDENTIFIER: [name] is a 37 y.o., adult with a past medical history of HTN, GERD, and gender dysphoria. He has followed with [facility] since 06/18/2017 for consultative assistance with management of his gender dysphoria.   HISTORICAL SUMMARY: The patient was first diagnosed with gender dysphoria in the past, started gender affirming medical treatment in 2015.  Patient is s/p bilateral mastectomy in 2019   Has followed up with Dr. Everardo All from 2019 until October 2022    HYPERTHYROID HISTORY: Patient has been noted with intermittent low TSH since 2020.  No thionamides treatment was needed.    SUBJECTIVE:    Today (11/17/2022):  Madeline Larsen is here for a follow-up on gender dysphoria and hyperthyroidism.   Presented to the ED with rash and hand swelling and pruritus and dx with for dermatomyositis , resolved with steroids .  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, he currently has a dietitian as well as a Patent examiner to his work and has been helping him with improving his lifestyle  Denies palpitations  Denies constipation  or diarrhea  Denies local neck swelling  Denies acne  Stable hair growth  LMP > 2 months ago   Last dose of testosterone was last Thursday   Testosterone  100 mg (0.5 mL)  every 2 weeks   HISTORY:  Past Medical History:  Past Medical History:  Diagnosis Date   Depression    Female-to-female transgender person    GERD (gastroesophageal reflux disease)    MRSA (methicillin resistant staph aureus) culture positive    Past Surgical History:  Past Surgical History:  Procedure Laterality Date   FOOT SURGERY Left    top surgery  02/2018    Social History:  reports that he has never smoked. He has never used smokeless tobacco. He reports current alcohol use. He reports that he does not currently use drugs after having used the following drugs: Marijuana. Family History:  Family History  Problem Relation Age of Onset   Alcohol abuse Mother    Cancer Mother 15       Uterine cancer   Drug abuse Mother    HIV/AIDS Mother    Early death Mother 87       uterine cancer   Alcohol abuse Father    Drug abuse Father    Polymyositis Father 87       deceased at age 70   Early death Father 73       polymyositis   Alcohol abuse Sister    Depression Sister    Drug abuse Sister    Depression Brother    Kidney disease Maternal Grandmother        ESRD on dialysis   Hypertension Maternal Grandmother    Rheum arthritis Paternal Grandmother    Rheum arthritis Brother    Depression Sister    Depression Sister    Depression Sister    Lupus Cousin    Lupus Cousin    Lupus Cousin    Prostate cancer Paternal Uncle      HOME MEDICATIONS: Allergies as of 11/18/2022   No Known Allergies      Medication List  Accurate as of November 17, 2022  1:40 PM. If you have any questions, ask your nurse or doctor.          NEEDLE (DISP) 23 G 23G X 1" Misc Use as directed   SYRINGE-NEEDLE (DISP) 3 ML 18G X 1-1/2" 3 ML Misc Use as directed.   testosterone cypionate 200 MG/ML injection Commonly known as: Depo-Testosterone Inject 0.5 mLs (100 mg total) into the muscle every 14 (fourteen) days. And syringes 1/week          OBJECTIVE:   PHYSICAL EXAM: VS: There were no vitals taken for this visit.   EXAM: General: Pt appears well and is in NAD  Eyes: External eye exam normal without stare, lid lag or exophthalmos.  EOM intact.    Neck: General: Supple without adenopathy. Thyroid: Thyroid size normal.  No goiter or nodules appreciated.   Lungs: Clear with good BS bilat   Heart: Auscultation: RRR.  Abdomen:  Normoactive bowel sounds, soft, nontender, without masses or organomegaly palpable  Extremities:  BL LE: No pretibial edema   Mental Status: Judgment, insight: Intact Orientation: Oriented to time, place, and person Mood and affect: No depression, anxiety, or agitation     DATA REVIEWED:   Latest Reference Range & Units 05/16/22 14:35  Sodium 135 - 146 mmol/L 139  Potassium 3.5 - 5.3 mmol/L 4.6  Chloride 98 - 110 mmol/L 105  CO2 20 - 32 mmol/L 24  Glucose 65 - 99 mg/dL 85  BUN 7 - 25 mg/dL 11  Creatinine 2.84 - 1.32 mg/dL 4.40  Calcium 8.6 - 10.2 mg/dL 9.5  BUN/Creatinine Ratio 6 - 22 (calc) SEE NOTE:  AG Ratio 1.0 - 2.5 (calc) 1.4  AST 10 - 30 U/L 17  ALT 6 - 29 U/L 15  Total Protein 6.1 - 8.1 g/dL 7.1    Latest Reference Range & Units 05/16/22 14:35  WBC 3.8 - 10.8 Thousand/uL 3.7 (L)  RBC 3.80 - 5.10 Million/uL 5.05  Hemoglobin 11.7 - 15.5 g/dL 72.5  HCT 36.6 - 44.0 % 43.6  MCV 80.0 - 100.0 fL 86.3  MCH 27.0 - 33.0 pg 28.5  MCHC 32.0 - 36.0 g/dL 34.7  RDW 42.5 - 95.6 % 12.7  Platelets 140 - 400 Thousand/uL 316  MPV 7.5 - 12.5 fL 10.6  (L): Data is abnormally low    Latest Reference Range & Units 11/15/21 14:12  Testosterone 15.00 - 40.00 ng/dL 38.75  TSH 6.43 - 3.29 uIU/mL 0.54  T4,Free(Direct) 0.60 - 1.60 ng/dL 5.18    ASSESSMENT / PLAN / RECOMMENDATIONS:   Gender Dysphoria :  -Female to female -Patient with inconsistent intake of testosterone replacement therapy, in the past  - Awaiting testosterone level for today     Medications  Testosterone cypionate 200/ml, 100 mg Q 14 days    Follow-up in 6 months   Signed electronically by: Lyndle Herrlich, MD  [facility]  [facility] [address] [place] [place], [place] [address] Phone: [phone] FAX: [phone]      CC: Anne Ng, NP [address] [place] [address] Phone: [phone]  Fax: [phone]   Return to  Endocrinology clinic as below: Future Appointments  Date Time Provider Department Center  11/18/2022 12:10 PM Kayston Jodoin, Konrad Dolores, MD [facility] None

## 2022-11-18 ENCOUNTER — Encounter: Payer: Self-pay | Admitting: Internal Medicine

## 2022-11-18 ENCOUNTER — Ambulatory Visit (INDEPENDENT_AMBULATORY_CARE_PROVIDER_SITE_OTHER): Payer: Self-pay | Admitting: Internal Medicine

## 2022-11-18 VITALS — BP 140/90 | HR 79 | Ht 62.0 in | Wt 281.2 lb

## 2022-11-18 DIAGNOSIS — F649 Gender identity disorder, unspecified: Secondary | ICD-10-CM

## 2022-11-18 DIAGNOSIS — D72819 Decreased white blood cell count, unspecified: Secondary | ICD-10-CM

## 2022-11-18 LAB — CBC
HCT: 41.9 % (ref 36.0–46.0)
Hemoglobin: 13.5 g/dL (ref 12.0–15.0)
MCHC: 32.2 g/dL (ref 30.0–36.0)
MCV: 90 fl (ref 78.0–100.0)
Platelets: 267 10*3/uL (ref 150.0–400.0)
RBC: 4.65 Mil/uL (ref 3.87–5.11)
RDW: 13.2 % (ref 11.5–15.5)
WBC: 2.9 10*3/uL — ABNORMAL LOW (ref 4.0–10.5)

## 2022-11-18 LAB — BASIC METABOLIC PANEL WITH GFR
BUN: 7 mg/dL (ref 6–23)
CO2: 28 meq/L (ref 19–32)
Calcium: 9.2 mg/dL (ref 8.4–10.5)
Chloride: 102 meq/L (ref 96–112)
Creatinine, Ser: 0.86 mg/dL (ref 0.40–1.20)
GFR: 85.53 mL/min
Glucose, Bld: 95 mg/dL (ref 70–99)
Potassium: 4.2 meq/L (ref 3.5–5.1)
Sodium: 138 meq/L (ref 135–145)

## 2022-11-19 ENCOUNTER — Telehealth: Payer: Self-pay | Admitting: Internal Medicine

## 2022-11-19 NOTE — Telephone Encounter (Signed)
Please let the patient know that his white cell count is low and it has gotten worse, I do not see where this has been investigated before and because this is beyond the scope of endocrinology I would like to refer the patient to hematology to investigate further  Please take permission from the patient so I can put the referral in   Thanks

## 2022-11-19 NOTE — Telephone Encounter (Signed)
Patient agrees to have referral to hematology.

## 2022-11-21 LAB — TESTOSTERONE, TOTAL, LC/MS/MS: Testosterone, Total, LC-MS-MS: 633 ng/dL — ABNORMAL HIGH (ref 2–45)

## 2022-11-21 MED ORDER — "SYRINGE/NEEDLE (DISP) 18G X 1-1/2"" 3 ML MISC": 3 refills | Status: AC

## 2022-11-21 MED ORDER — "NEEDLE (DISP) 23G X 1"" MISC": 23.0000 g | 0 refills | Status: AC

## 2022-11-21 MED ORDER — TESTOSTERONE CYPIONATE 200 MG/ML IM SOLN: 100.0000 mg | INTRAMUSCULAR | 5 refills | Status: DC

## 2022-11-21 NOTE — Addendum Note (Signed)
Addended by: Scarlette Shorts on: 11/21/2022 09:00 AM   Modules accepted: Orders

## 2022-12-22 ENCOUNTER — Inpatient Hospital Stay: Payer: Self-pay

## 2022-12-22 ENCOUNTER — Inpatient Hospital Stay: Payer: Self-pay | Attending: Hematology | Admitting: Hematology

## 2022-12-22 VITALS — BP 152/97 | HR 66 | Temp 97.8°F | Resp 17 | Wt 288.2 lb

## 2022-12-22 DIAGNOSIS — Z8042 Family history of malignant neoplasm of prostate: Secondary | ICD-10-CM | POA: Insufficient documentation

## 2022-12-22 DIAGNOSIS — D709 Neutropenia, unspecified: Secondary | ICD-10-CM

## 2022-12-22 DIAGNOSIS — Z8049 Family history of malignant neoplasm of other genital organs: Secondary | ICD-10-CM | POA: Insufficient documentation

## 2022-12-22 LAB — CBC WITH DIFFERENTIAL (CANCER CENTER ONLY)
Abs Immature Granulocytes: 0.01 10*3/uL (ref 0.00–0.07)
Basophils Absolute: 0 10*3/uL (ref 0.0–0.1)
Basophils Relative: 1 %
Eosinophils Absolute: 0 10*3/uL (ref 0.0–0.5)
Eosinophils Relative: 1 %
HCT: 42.3 % (ref 36.0–46.0)
Hemoglobin: 14 g/dL (ref 12.0–15.0)
Immature Granulocytes: 0 %
Lymphocytes Relative: 31 %
Lymphs Abs: 1.1 10*3/uL (ref 0.7–4.0)
MCH: 29.7 pg (ref 26.0–34.0)
MCHC: 33.1 g/dL (ref 30.0–36.0)
MCV: 89.6 fL (ref 80.0–100.0)
Monocytes Absolute: 0.3 10*3/uL (ref 0.1–1.0)
Monocytes Relative: 10 %
Neutro Abs: 2 10*3/uL (ref 1.7–7.7)
Neutrophils Relative %: 57 %
Platelet Count: 260 10*3/uL (ref 150–400)
RBC: 4.72 MIL/uL (ref 3.87–5.11)
RDW: 11.9 % (ref 11.5–15.5)
WBC Count: 3.5 10*3/uL — ABNORMAL LOW (ref 4.0–10.5)
nRBC: 0 % (ref 0.0–0.2)

## 2022-12-22 LAB — HIV ANTIBODY (ROUTINE TESTING W REFLEX): HIV Screen 4th Generation wRfx: NONREACTIVE

## 2022-12-22 LAB — CMP (CANCER CENTER ONLY)
ALT: 20 U/L (ref 0–44)
AST: 24 U/L (ref 15–41)
Albumin: 4.3 g/dL (ref 3.5–5.0)
Alkaline Phosphatase: 61 U/L (ref 38–126)
Anion gap: 6 (ref 5–15)
BUN: 10 mg/dL (ref 6–20)
CO2: 26 mmol/L (ref 22–32)
Calcium: 9.5 mg/dL (ref 8.9–10.3)
Chloride: 104 mmol/L (ref 98–111)
Creatinine: 0.95 mg/dL (ref 0.44–1.00)
GFR, Estimated: >60 mL/min (ref >60)
Glucose, Bld: 89 mg/dL (ref 70–99)
Potassium: 3.9 mmol/L (ref 3.5–5.1)
Sodium: 136 mmol/L (ref 135–145)
Total Bilirubin: 0.8 mg/dL (ref 0.3–1.2)
Total Protein: 7.9 g/dL (ref 6.5–8.1)

## 2022-12-22 LAB — HEPATITIS C ANTIBODY: HCV Ab: NONREACTIVE

## 2022-12-22 LAB — LACTATE DEHYDROGENASE: LDH: 180 U/L (ref 98–192)

## 2022-12-22 LAB — VITAMIN B12: Vitamin B-12: 251 pg/mL (ref 180–914)

## 2022-12-22 NOTE — Progress Notes (Signed)
HEMATOLOGY/ONCOLOGY CONSULTATION NOTE  Date of Service: 12/22/2022  Patient Care Team: Default, Provider, MD as PCP - General  CHIEF COMPLAINTS/PURPOSE OF CONSULTATION:  Chronic Leukopenia  HISTORY OF PRESENTING ILLNESS:  Madeline Larsen is a wonderful 37 y.o. adult who has been referred to [place] by Dr. Lesly Dukes for evaluation and management of Leukopenia. According to Dr. Harvel Ricks last note, patient has chronic leukopenia, but was worsened on 11/18/2022. Last 3 lab results: 11/17/2022 showed WBC of 3.0 K, 05/16/2022 showed WBC of 3.7 K, and 11/18/2022 showed WBC of 2.9 K.  Patient was last seen by me on 06/07/2019.  Patient notes that he has been doing well overall since our last visit in 2021. Patient is not following up with Dr. Corliss Skains or other Rheumatologist since our last visit. Patient notes that he discontinued Plaquenil around 2 years ago.   Patient notes he had severe body pains around 2 months ago. He does note that his body aches, joint pain, and bone pain has improved since 2021.   He denies any new infection issues, fever, chills, night sweats, new lumps/bumps, abdominal pain, chest pain, back pain, or leg swelling.   During the physical exam, patient complains of left medial abdominal pain.  He is still receiving testosterone injection every 2 weeks. Patient also takes Aleve as needed.    In the past 6 months, patient notes he has been feeling more tired and fatigued due to sleep inconsistency. Patient has had sleep study in the past. Patient has been using cpap machine. He notes that his sleeping was initially better when he used cpap machine. Patient notes he usually gets 4-5 hours of sleep.   Patient is currently a truck driver.   MEDICAL HISTORY:  Past Medical History:  Diagnosis Date   Depression    Female-to-female transgender person    GERD (gastroesophageal reflux disease)    MRSA (methicillin resistant staph aureus) culture positive      SURGICAL HISTORY: Past Surgical History:  Procedure Laterality Date   FOOT SURGERY Left    top surgery  02/2018    SOCIAL HISTORY: Social History   Socioeconomic History   Marital status: Married    Spouse name: Not on file   Number of children: 2   Years of education: Not on file   Highest education level: High school graduate  Occupational History   Not on file  Tobacco Use   Smoking status: Never   Smokeless tobacco: Never  Vaping Use   Vaping status: Never Used  Substance and Sexual Activity   Alcohol use: Yes    Alcohol/week: 0.0 standard drinks of alcohol    Comment: socially   Drug use: Not Currently    Types: Marijuana   Sexual activity: Yes    Birth control/protection: None    Comment: female partner  Other Topics Concern   Not on file  Social History Narrative   Lives at home with wife & kids   Right handed   Social Determinants of Health   Financial Resource Strain: Not on file  Food Insecurity: Not on file  Transportation Needs: Not on file  Physical Activity: Not on file  Stress: Not on file  Social Connections: Not on file  Intimate Partner Violence: Low Risk  (03/21/2022)   Received from [facility]   Interpersonal Safety    Feels UN-safe at Home or Work/School: no    FAMILY HISTORY: Family History  Problem  Relation Age of Onset   Alcohol abuse Mother    Cancer Mother 40       Uterine cancer   Drug abuse Mother    HIV/AIDS Mother    Early death Mother 42       uterine cancer   Alcohol abuse Father    Drug abuse Father    Polymyositis Father 51       deceased at age 29   Early death Father 51       polymyositis   Alcohol abuse Sister    Depression Sister    Drug abuse Sister    Depression Brother    Kidney disease Maternal Grandmother        ESRD on dialysis   Hypertension Maternal Grandmother    Rheum arthritis Paternal Grandmother    Rheum arthritis Brother    Depression  Sister    Depression Sister    Depression Sister    Lupus Cousin    Lupus Cousin    Lupus Cousin    Prostate cancer Paternal Uncle     ALLERGIES:  has No Known Allergies.  MEDICATIONS:  Current Outpatient Medications  Medication Sig Dispense Refill   NEEDLE, DISP, 23 G 23G X 1" MISC Use as directed 10 each 0   SYRINGE-NEEDLE, DISP, 3 ML 18G X 1-1/2" 3 ML MISC Use as directed. 10 each 3   testosterone cypionate (DEPO-TESTOSTERONE) 200 MG/ML injection Inject 0.5 mLs (100 mg total) into the muscle every 14 (fourteen) days. And syringes 1/week 2 mL 5   No current facility-administered medications for this visit.    REVIEW OF SYSTEMS:    10 Point review of Systems was done is negative except as noted above.  PHYSICAL EXAMINATION: ECOG PERFORMANCE STATUS: 1 - Symptomatic but completely ambulatory  . Vitals:   12/22/22 1449  BP: (!) 152/97  Pulse: 66  Resp: 17  Temp: 97.8 F (36.6 C)  SpO2: 100%   Filed Weights   12/22/22 1449  Weight: 288 lb 3.2 oz (130.7 kg)   .Body mass index is 52.71 kg/m.  GENERAL:alert, in no acute distress and comfortable SKIN: no acute rashes, no significant lesions EYES: conjunctiva are pink and non-injected, sclera anicteric OROPHARYNX: MMM, no exudates, no oropharyngeal erythema or ulceration NECK: supple, no JVD LYMPH:  no palpable lymphadenopathy in the cervical, axillary or inguinal regions LUNGS: clear to auscultation b/l with normal respiratory effort HEART: regular rate & rhythm ABDOMEN:  normoactive bowel sounds , non tender, not distended. Extremity: no pedal edema PSYCH: alert & oriented x 3 with fluent speech NEURO: no focal motor/sensory deficits  LABORATORY DATA:  I have reviewed the data as listed  .    Latest Ref Rng & Units 12/22/2022    3:49 PM 11/18/2022   12:38 PM 05/16/2022    2:35 PM  CBC  WBC 4.0 - 10.5 K/uL 3.5  2.9  3.7   Hemoglobin 12.0 - 15.0 g/dL 16.1  09.6  04.5   Hematocrit 36.0 - 46.0 % 42.3  41.9   43.6   Platelets 150 - 400 K/uL 260  267.0  316    ANC 2000 .    Latest Ref Rng & Units 12/22/2022    3:49 PM 11/18/2022   12:38 PM 05/16/2022    2:35 PM  CMP  Glucose 70 - 99 mg/dL 89  95  85   BUN 6 - 20 mg/dL 10  7  11    Creatinine 0.44 - 1.00 mg/dL 4.09  0.86  0.89   Sodium 135 - 145 mmol/L 136  138  139   Potassium 3.5 - 5.1 mmol/L 3.9  4.2  4.6   Chloride 98 - 111 mmol/L 104  102  105   CO2 22 - 32 mmol/L 26  28  24    Calcium 8.9 - 10.3 mg/dL 9.5  9.2  9.5   Total Protein 6.5 - 8.1 g/dL 7.9   7.1   Total Bilirubin 0.3 - 1.2 mg/dL 0.8   0.3   Alkaline Phos 38 - 126 U/L 61     AST 15 - 41 U/L 24   17   ALT 0 - 44 U/L 20   15      RADIOGRAPHIC STUDIES: I have personally reviewed the radiological images as listed and agreed with the findings in the report. No results found.  ASSESSMENT & PLAN:   37 yo with possible autoimmune inflammatory condition ? 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 - would recommend B12 500 mcg SL daily and B complex 1 cap po daily to address any deficiencies and to keep B12 >400 -f/u with PCP.Marland Kitchen no additional hematologic w/u at this time. -if other cytopenias develop or ANC<500 would consider BM Bx  . Orders Placed This Encounter  Procedures   CBC with Differential (Cancer Center Only)    Standing Status:   Future    Number of Occurrences:   1    Standing Expiration Date:   12/22/2023   CMP (Cancer Center only)    Standing Status:   Future    Number of Occurrences:   1    Standing Expiration Date:   12/22/2023   Lactate dehydrogenase    Standing  Status:   Future    Number of Occurrences:   1    Standing Expiration Date:   12/22/2023   Vitamin B12    Standing Status:   Future    Number of Occurrences:   1    Standing Expiration Date:   12/22/2023   Copper, serum    Standing Status:   Future    Number of Occurrences:   1    Standing Expiration Date:   12/22/2023   Hepatitis C antibody    Standing Status:   Future    Number of Occurrences:   1    Standing Expiration Date:   12/22/2023   HIV Antibody (routine testing w rflx)    Standing Status:   Future    Number of Occurrences:   1    Standing Expiration Date:   12/22/2023    FOLLOW-UP: Labs today Phone visit with Dr Candise Che in 1 week .The total time spent in the appointment was 45 minutes* .  All of the patient's questions were answered with apparent satisfaction. The patient knows to call the clinic with any problems, questions or concerns.   Wyvonnia Lora MD MS AAHIVMS [facility] Hematology/Oncology Physician [facility]  .*Total Encounter Time as defined by the Centers for Medicare and Medicaid Services includes, in addition to the face-to-face time of a patient visit (documented in  the note above) non-face-to-face time: obtaining and reviewing outside history, ordering and reviewing medications, tests or procedures, care coordination (communications with other health care professionals or caregivers) and documentation in the medical record.   12/22/2022 12:40 PM   I,Param Shah,acting as a scribe for Wyvonnia Lora, MD.,have documented all relevant documentation on the behalf of Wyvonnia Lora, MD,as directed by  Wyvonnia Lora, MD while in the presence of Wyvonnia Lora, MD.  .I have reviewed the above documentation for accuracy and completeness, and I agree with the above. Johney Maine MD

## 2022-12-24 LAB — COPPER, SERUM: Copper: 112 ug/dL (ref 80–158)

## 2022-12-30 ENCOUNTER — Telehealth: Payer: Self-pay | Admitting: Hematology

## 2022-12-30 NOTE — Telephone Encounter (Signed)
 Left patient a vm regarding upcoming appointment

## 2023-01-01 ENCOUNTER — Inpatient Hospital Stay (HOSPITAL_BASED_OUTPATIENT_CLINIC_OR_DEPARTMENT_OTHER): Payer: Self-pay | Admitting: Hematology

## 2023-01-01 DIAGNOSIS — D709 Neutropenia, unspecified: Secondary | ICD-10-CM

## 2023-01-01 NOTE — Progress Notes (Signed)
HEMATOLOGY/ONCOLOGY PHONE VISIT NOTE  Date of Service: 01/01/2023  Patient Care Team: Default, Provider, MD as PCP - General  CHIEF COMPLAINTS/PURPOSE OF CONSULTATION:  Chronic Leukopenia  HISTORY OF PRESENTING ILLNESS:  Madeline Larsen is a wonderful 37 y.o. adult who has been referred to [place] by Dr. Lesly Dukes for evaluation and management of Leukopenia. According to Dr. Harvel Ricks last note, patient has chronic leukopenia, but was worsened on 11/18/2022. Last 3 lab results: 11/17/2022 showed WBC of 3.0 K, 05/16/2022 showed WBC of 3.7 K, and 11/18/2022 showed WBC of 2.9 K.  Patient was last seen by me on 06/07/2019.  Patient notes that he has been doing well overall since our last visit in 2021. Patient is not following up with Dr. Corliss Skains or other Rheumatologist since our last visit. Patient notes that he discontinued Plaquenil around 2 years ago.   Patient notes he had severe body pains around 2 months ago. He does note that his body aches, joint pain, and bone pain has improved since 2021.   He denies any new infection issues, fever, chills, night sweats, new lumps/bumps, abdominal pain, chest pain, back pain, or leg swelling.   During the physical exam, patient complains of left medial abdominal pain.  He is still receiving testosterone injection every 2 weeks. Patient also takes Aleve as needed.    In the past 6 months, patient notes he has been feeling more tired and fatigued due to sleep inconsistency. Patient has had sleep study in the past. Patient has been using cpap machine. He notes that his sleeping was initially better when he used cpap machine. Patient notes he usually gets 4-5 hours of sleep.   Patient is currently a truck driver.   INTERVAL HISTORY:  Madeline Larsen is a 37 y.o. female who is being connected with for continued evaluation and management of Leukopenia.  I connected with [name] on 01/01/23 at  1:30 PM EDT by telephone visit and  verified that I am speaking with the correct person using two identifiers.   I discussed the limitations, risks, security and privacy concerns of performing an evaluation and management service by telemedicine and the availability of in-person appointments. I also discussed with the patient that there may be a patient responsible charge related to this service. The patient expressed understanding and agreed to proceed.   Other persons participating in the visit and their role in the encounter: none   Patient's location: home  Provider's location: [facility]   Chief Complaint: Evaluation and management of leukopenia    Patient reports that he has not been on Plaquenil recently. The results of his recent lab workup was discussed with him in detail.  MEDICAL HISTORY:  Past Medical History:  Diagnosis Date   Depression    Female-to-female transgender person    GERD (gastroesophageal reflux disease)    MRSA (methicillin resistant staph aureus) culture positive     SURGICAL HISTORY: Past Surgical History:  Procedure Laterality Date   FOOT SURGERY Left    top surgery  02/2018    SOCIAL HISTORY: Social History   Socioeconomic History   Marital status: Married    Spouse name: Not on file   Number of children: 2   Years of education: Not on file   Highest education level: High school graduate  Occupational History   Not on file  Tobacco Use   Smoking status: Never   Smokeless tobacco: Never  Vaping Use   Vaping status: Never Used  Substance and Sexual Activity   Alcohol use: Yes    Alcohol/week: 0.0 standard drinks of alcohol    Comment: socially   Drug use: Not Currently    Types: Marijuana   Sexual activity: Yes    Birth control/protection: None    Comment: female partner  Other Topics Concern   Not on file  Social History Narrative   Lives at home with wife & kids   Right handed   Social Determinants of Health   Financial Resource Strain: Not on file  Food Insecurity:  Not on file  Transportation Needs: Not on file  Physical Activity: Not on file  Stress: Not on file  Social Connections: Not on file  Intimate Partner Violence: Low Risk  (03/21/2022)   Received from [facility]   Interpersonal Safety    Feels UN-safe at Home or Work/School: no    FAMILY HISTORY: Family History  Problem Relation Age of Onset   Alcohol abuse Mother    Cancer Mother 21       Uterine cancer   Drug abuse Mother    HIV/AIDS Mother    Early death Mother 82       uterine cancer   Alcohol abuse Father    Drug abuse Father    Polymyositis Father 51       deceased at age 29   Early death Father 62       polymyositis   Alcohol abuse Sister    Depression Sister    Drug abuse Sister    Depression Brother    Kidney disease Maternal Grandmother        ESRD on dialysis   Hypertension Maternal Grandmother    Rheum arthritis Paternal Grandmother    Rheum arthritis Brother    Depression Sister    Depression Sister    Depression Sister    Lupus Cousin    Lupus Cousin    Lupus Cousin    Prostate cancer Paternal Uncle     ALLERGIES:  has No Known Allergies.  MEDICATIONS:  Current Outpatient Medications  Medication Sig Dispense Refill   NEEDLE, DISP, 23 G 23G X 1" MISC Use as directed 10 each 0   SYRINGE-NEEDLE, DISP, 3 ML 18G X 1-1/2" 3 ML MISC Use as directed. 10 each 3   testosterone cypionate (DEPO-TESTOSTERONE) 200 MG/ML injection Inject 0.5 mLs (100 mg total) into the muscle every 14 (fourteen) days. And syringes 1/week 2 mL 5   No current facility-administered medications for this visit.    REVIEW OF SYSTEMS:    10 Point review of Systems was done is negative except as noted above.   PHYSICAL EXAMINATION: Telemedicine visit  LABORATORY DATA:  I have reviewed the data as listed  .    Latest Ref Rng & Units 12/22/2022    3:49 PM 11/18/2022   12:38 PM 05/16/2022    2:35 PM  CBC  WBC 4.0 - 10.5 K/uL 3.5   2.9  3.7   Hemoglobin 12.0 - 15.0 g/dL 56.2  13.0  86.5   Hematocrit 36.0 - 46.0 % 42.3  41.9  43.6   Platelets 150 - 400 K/uL 260  267.0  316    ANC 2000 .    Latest Ref Rng & Units 12/22/2022    3:49 PM 11/18/2022   12:38 PM 05/16/2022    2:35 PM  CMP  Glucose 70 - 99 mg/dL 89  95  85  BUN 6 - 20 mg/dL 10  7  11    Creatinine 0.44 - 1.00 mg/dL 4.09  8.11  9.14   Sodium 135 - 145 mmol/L 136  138  139   Potassium 3.5 - 5.1 mmol/L 3.9  4.2  4.6   Chloride 98 - 111 mmol/L 104  102  105   CO2 22 - 32 mmol/L 26  28  24    Calcium 8.9 - 10.3 mg/dL 9.5  9.2  9.5   Total Protein 6.5 - 8.1 g/dL 7.9   7.1   Total Bilirubin 0.3 - 1.2 mg/dL 0.8   0.3   Alkaline Phos 38 - 126 U/L 61     AST 15 - 41 U/L 24   17   ALT 0 - 44 U/L 20   15      RADIOGRAPHIC STUDIES: I have personally reviewed the radiological images as listed and agreed with the findings in the report. No results found.  ASSESSMENT & PLAN:   37 yo with possible autoimmune inflammatory condition ? 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 His WBCs have normalized at this time. -His WBCs were 2.9K when checked by PCP 1 month ago. -Discussed lab results from 12/22/2022 in detail with patient. CBC showed WBC of 3.5K, hemoglobin of 14.0, and platelets of 260K. -WBCs nearly normalized -Neutrophils normal at 2K -it is likely that his WBCs have improved since being off of Plaquenil and his underlying autoimmune condition has improved.  -his other blood cells, including hemoglobin and platelets are normal -CMP normal -HIV and hepatitis C testing is negative -copper level normal -LDH normal -vitamin B12 level is slightly low at 250. Discussed goal level of 400. -optimizing B12 levels may improve blood  counts.  -recommend taking a vitamin B complex one capsule daily to cover any subtle B vitamin deficiencies. Would also recommend taking an additional B12 supplement 500 mcg daily. -no indication for additional workup at this time -answered all of patient's questions in detail -Continue to follow-up with PCP regularly for optimal care  FOLLOW-UP: RTC with PCP  The total time spent in the appointment was 20 minutes* .  All of the patient's questions were answered with apparent satisfaction. The patient knows to call the clinic with any problems, questions or concerns.   Wyvonnia Lora MD MS AAHIVMS [facility] Hematology/Oncology Physician [facility]  .*Total Encounter Time as defined by the Centers for Medicare and Medicaid Services includes, in addition to the face-to-face time of a patient visit (documented in the note above) non-face-to-face time: obtaining and reviewing outside history, ordering and reviewing medications, tests or procedures, care coordination (communications with other health care professionals or caregivers) and documentation in the medical record.    I,Mitra Faeizi,acting as a Neurosurgeon for Wyvonnia Lora, MD.,have documented all relevant documentation on the behalf of Wyvonnia Lora, MD,as directed by  Wyvonnia Lora, MD while in the presence of Wyvonnia Lora, MD.  .I have reviewed the above documentation for accuracy and completeness, and I agree with the above. Johney Maine MD

## 2023-05-13 NOTE — Progress Notes (Unsigned)
 Name: Madeline Larsen  MRN/ DOB: [id number], 08/16/1985    Age/ Sex: 38 y.o., adult     PCP: Default, Provider, MD   Reason for Endocrinology Evaluation: Gender dysphoria/hyperthyroidism     Initial Endocrinology Clinic Visit: 06/18/2017    PATIENT IDENTIFIER: [name] is a 38 y.o., adult with a past medical history of HTN, GERD, and gender dysphoria. He has followed with [facility] since 06/18/2017 for consultative assistance with management of his gender dysphoria.   HISTORICAL SUMMARY: The patient was first diagnosed with gender dysphoria in the past, started gender affirming medical treatment in 2015.  Patient is s/p bilateral mastectomy in 2019   Has followed up with Dr. Everardo All from 2019 until October 2022    HYPERTHYROID HISTORY: Patient has been noted with intermittent low TSH since 2020.  No thionamides treatment was needed.    SUBJECTIVE:    Today (05/13/2023):  Madeline Larsen is here for a follow-up on gender dysphoria and hyperthyroidism.    Denies palpitations  Denies constipation  or diarrhea  Denies acne  Stable hair growth  LMP > 6 months ago  Has chronic LE edema   Last dose of testosterone was last Thursday   Testosterone  100 mg (1 mL)  every 2 weeks   HISTORY:  Past Medical History:  Past Medical History:  Diagnosis Date   Depression    Female-to-female transgender person    GERD (gastroesophageal reflux disease)    MRSA (methicillin resistant staph aureus) culture positive    Past Surgical History:  Past Surgical History:  Procedure Laterality Date   FOOT SURGERY Left    top surgery  02/2018   Social History:  reports that he has never smoked. He has never used smokeless tobacco. He reports current alcohol use. He reports that he does not currently use drugs after having used the following drugs: Marijuana. Family History:  Family History  Problem Relation Age of Onset   Alcohol abuse Mother    Cancer Mother 38        Uterine cancer   Drug abuse Mother    HIV/AIDS Mother    Early death Mother 27       uterine cancer   Alcohol abuse Father    Drug abuse Father    Polymyositis Father 79       deceased at age 33   Early death Father 47       polymyositis   Alcohol abuse Sister    Depression Sister    Drug abuse Sister    Depression Brother    Kidney disease Maternal Grandmother        ESRD on dialysis   Hypertension Maternal Grandmother    Rheum arthritis Paternal Grandmother    Rheum arthritis Brother    Depression Sister    Depression Sister    Depression Sister    Lupus Cousin    Lupus Cousin    Lupus Cousin    Prostate cancer Paternal Uncle      HOME MEDICATIONS: Allergies as of 05/14/2023   No Known Allergies      Medication List        Accurate as of May 13, 2023 12:54 PM. If you have any questions, ask your nurse or doctor.          NEEDLE (DISP) 23 G 23G X 1" Misc Use as directed   SYRINGE-NEEDLE (DISP) 3 ML 18G X 1-1/2" 3 ML Misc Use as directed.  testosterone cypionate 200 MG/ML injection Commonly known as: Depo-Testosterone Inject 0.5 mLs (100 mg total) into the muscle every 14 (fourteen) days. And syringes 1/week          OBJECTIVE:   PHYSICAL EXAM: VS: There were no vitals taken for this visit.    EXAM: General: Pt appears well and is in NAD  Neck: General: Supple without adenopathy. Thyroid: Thyroid size normal.  No goiter or nodules appreciated.   Lungs: Clear with good BS bilat   Heart: Auscultation: RRR.  Abdomen:  soft, nontender  Extremities:  BL LE: No pretibial edema   Mental Status: Judgment, insight: Intact Orientation: Oriented to time, place, and person Mood and affect: No depression, anxiety, or agitation     DATA REVIEWED:  Latest Reference Range & Units 11/18/22 12:38  Sodium 135 - 145 mEq/L 138  Potassium 3.5 - 5.1 mEq/L 4.2  Chloride 96 - 112 mEq/L 102  CO2 19 - 32 mEq/L 28  Glucose 70 - 99 mg/dL 95  BUN 6  - 23 mg/dL 7  Creatinine 1.61 - 0.96 mg/dL 0.45  Calcium 8.4 - 40.9 mg/dL 9.2    Latest Reference Range & Units 11/18/22 12:38  GFR >60.00 mL/min 85.53    Latest Reference Range & Units 11/18/22 12:38  WBC 4.0 - 10.5 K/uL 2.9 (L)  RBC 3.87 - 5.11 Mil/uL 4.65  Hemoglobin 12.0 - 15.0 g/dL 81.1  HCT 91.4 - 78.2 % 41.9  MCV 78.0 - 100.0 fl 90.0  MCHC 30.0 - 36.0 g/dL 95.6  RDW 21.3 - 08.6 % 13.2  Platelets 150.0 - 400.0 K/uL 267.0    Latest Reference Range & Units 05/16/22 14:35  Testosterone, Total, LC-MS-MS 2 - 45 ng/dL 578 (H)  (H): Data is abnormally high     ASSESSMENT / PLAN / RECOMMENDATIONS:   Gender Dysphoria :  -Female to female -Patient with optimal clinical outcome -Testosterone levels pending today    Medications  Testosterone cypionate 200/ml, 100 mg Q 14 days   2.  Leukopenia:  -This is chronic in nature but has worsened on today's labs -Will refer to hematology for further evaluation  Follow-up in 6 months   Signed electronically by: Lyndle Herrlich, MD  [facility]  [facility] [address] [place] [place], [place] [address] Phone: [phone] FAX: [phone]      CC: Default, Provider, MD No address on file Phone: None  Fax: None   Return to Endocrinology clinic as below: Future Appointments  Date Time Provider Department Center  05/14/2023 11:30 AM Emeric Novinger, Konrad Dolores, MD [id number] None

## 2023-05-14 ENCOUNTER — Encounter: Payer: Self-pay | Admitting: Internal Medicine

## 2023-05-14 ENCOUNTER — Ambulatory Visit (INDEPENDENT_AMBULATORY_CARE_PROVIDER_SITE_OTHER): Payer: Self-pay | Admitting: Internal Medicine

## 2023-05-14 VITALS — BP 124/70 | HR 87 | Ht 62.0 in | Wt 278.0 lb

## 2023-05-14 DIAGNOSIS — F649 Gender identity disorder, unspecified: Secondary | ICD-10-CM

## 2023-05-14 MED ORDER — TESTOSTERONE CYPIONATE 200 MG/ML IM SOLN: 100.0000 mg | INTRAMUSCULAR | 5 refills | Status: DC

## 2023-11-11 ENCOUNTER — Other Ambulatory Visit: Payer: Self-pay | Admitting: Internal Medicine

## 2024-05-13 ENCOUNTER — Ambulatory Visit: Payer: Self-pay | Admitting: Internal Medicine
